# Patient Record
Sex: Female | Born: 1945
Health system: Southern US, Community
[De-identification: ages and names within clinical notes are randomized; demographics above are authoritative.]

## PROBLEM LIST (undated history)

## (undated) DIAGNOSIS — T7840XA Allergy, unspecified, initial encounter: Secondary | ICD-10-CM

## (undated) DIAGNOSIS — M545 Low back pain, unspecified: Secondary | ICD-10-CM

## (undated) DIAGNOSIS — R002 Palpitations: Secondary | ICD-10-CM

## (undated) DIAGNOSIS — U071 COVID-19: Secondary | ICD-10-CM

## (undated) DIAGNOSIS — J449 Chronic obstructive pulmonary disease, unspecified: Secondary | ICD-10-CM

## (undated) DIAGNOSIS — N390 Urinary tract infection, site not specified: Secondary | ICD-10-CM

## (undated) DIAGNOSIS — R55 Syncope and collapse: Secondary | ICD-10-CM

## (undated) DIAGNOSIS — C801 Malignant (primary) neoplasm, unspecified: Secondary | ICD-10-CM

## (undated) DIAGNOSIS — E785 Hyperlipidemia, unspecified: Secondary | ICD-10-CM

## (undated) DIAGNOSIS — I10 Essential (primary) hypertension: Secondary | ICD-10-CM

## (undated) DIAGNOSIS — J069 Acute upper respiratory infection, unspecified: Secondary | ICD-10-CM

## (undated) DIAGNOSIS — K219 Gastro-esophageal reflux disease without esophagitis: Secondary | ICD-10-CM

## (undated) DIAGNOSIS — M1990 Unspecified osteoarthritis, unspecified site: Secondary | ICD-10-CM

## (undated) DIAGNOSIS — F411 Generalized anxiety disorder: Secondary | ICD-10-CM

## (undated) DIAGNOSIS — B029 Zoster without complications: Secondary | ICD-10-CM

## (undated) HISTORY — PX: OOPHORECTOMY: SHX86

## (undated) HISTORY — DX: Essential (primary) hypertension: I10

## (undated) HISTORY — DX: Allergy, unspecified, initial encounter: T78.40XA

## (undated) HISTORY — DX: Chronic obstructive pulmonary disease, unspecified: J44.9

## (undated) HISTORY — DX: COVID-19: U07.1

## (undated) HISTORY — DX: Urinary tract infection, site not specified: N39.0

## (undated) HISTORY — DX: Gastro-esophageal reflux disease without esophagitis: K21.9

## (undated) HISTORY — DX: Palpitations: R00.2

## (undated) HISTORY — DX: Generalized anxiety disorder: F41.1

## (undated) HISTORY — DX: Hyperlipidemia, unspecified: E78.5

## (undated) HISTORY — PX: BREAST SURGERY: SHX581

## (undated) HISTORY — DX: Unspecified osteoarthritis, unspecified site: M19.90

## (undated) HISTORY — DX: Low back pain: M54.5

## (undated) HISTORY — DX: Low back pain, unspecified: M54.50

## (undated) HISTORY — DX: Zoster without complications: B02.9

## (undated) HISTORY — DX: Syncope and collapse: R55

## (undated) HISTORY — DX: Acute upper respiratory infection, unspecified: J06.9

## (undated) HISTORY — DX: Malignant (primary) neoplasm, unspecified: C80.1

---

## 1998-03-09 ENCOUNTER — Other Ambulatory Visit: Admission: RE | Admit: 1998-03-09 | Discharge: 1998-03-09 | Payer: Self-pay | Admitting: Obstetrics and Gynecology

## 1999-05-01 ENCOUNTER — Other Ambulatory Visit: Admission: RE | Admit: 1999-05-01 | Discharge: 1999-05-01 | Payer: Self-pay | Admitting: Obstetrics and Gynecology

## 2000-05-01 ENCOUNTER — Other Ambulatory Visit: Admission: RE | Admit: 2000-05-01 | Discharge: 2000-05-01 | Payer: Self-pay | Admitting: Obstetrics and Gynecology

## 2001-05-11 ENCOUNTER — Other Ambulatory Visit: Admission: RE | Admit: 2001-05-11 | Discharge: 2001-05-11 | Payer: Self-pay | Admitting: Obstetrics and Gynecology

## 2002-05-12 ENCOUNTER — Other Ambulatory Visit: Admission: RE | Admit: 2002-05-12 | Discharge: 2002-05-12 | Payer: Self-pay | Admitting: Obstetrics and Gynecology

## 2003-04-20 HISTORY — PX: COLONOSCOPY: SHX174

## 2003-04-25 ENCOUNTER — Ambulatory Visit (HOSPITAL_COMMUNITY): Admission: RE | Admit: 2003-04-25 | Discharge: 2003-04-25 | Payer: Self-pay | Admitting: Internal Medicine

## 2003-04-25 ENCOUNTER — Encounter: Payer: Self-pay | Admitting: Internal Medicine

## 2003-05-16 ENCOUNTER — Other Ambulatory Visit: Admission: RE | Admit: 2003-05-16 | Discharge: 2003-05-16 | Payer: Self-pay | Admitting: Obstetrics and Gynecology

## 2003-05-22 ENCOUNTER — Encounter (INDEPENDENT_AMBULATORY_CARE_PROVIDER_SITE_OTHER): Payer: Self-pay | Admitting: Surgery

## 2003-05-22 ENCOUNTER — Ambulatory Visit (HOSPITAL_COMMUNITY): Admission: RE | Admit: 2003-05-22 | Discharge: 2003-05-22 | Payer: Self-pay | Admitting: Surgery

## 2003-05-22 ENCOUNTER — Ambulatory Visit (HOSPITAL_BASED_OUTPATIENT_CLINIC_OR_DEPARTMENT_OTHER): Admission: RE | Admit: 2003-05-22 | Discharge: 2003-05-22 | Payer: Self-pay | Admitting: Surgery

## 2003-05-22 ENCOUNTER — Encounter (INDEPENDENT_AMBULATORY_CARE_PROVIDER_SITE_OTHER): Payer: Self-pay | Admitting: Specialist

## 2003-06-12 ENCOUNTER — Encounter (HOSPITAL_COMMUNITY): Admission: RE | Admit: 2003-06-12 | Discharge: 2003-09-10 | Payer: Self-pay | Admitting: Surgery

## 2003-06-13 ENCOUNTER — Ambulatory Visit: Admission: RE | Admit: 2003-06-13 | Discharge: 2003-08-17 | Payer: Self-pay | Admitting: Radiation Oncology

## 2003-09-19 ENCOUNTER — Ambulatory Visit: Admission: RE | Admit: 2003-09-19 | Discharge: 2003-09-19 | Payer: Self-pay | Admitting: Radiation Oncology

## 2004-04-09 ENCOUNTER — Ambulatory Visit: Admission: RE | Admit: 2004-04-09 | Discharge: 2004-04-09 | Payer: Self-pay | Admitting: Radiation Oncology

## 2004-04-16 ENCOUNTER — Ambulatory Visit: Admission: RE | Admit: 2004-04-16 | Discharge: 2004-04-16 | Payer: Self-pay | Admitting: Radiation Oncology

## 2004-05-16 ENCOUNTER — Other Ambulatory Visit: Admission: RE | Admit: 2004-05-16 | Discharge: 2004-05-16 | Payer: Self-pay | Admitting: Obstetrics and Gynecology

## 2004-12-25 ENCOUNTER — Ambulatory Visit: Admission: RE | Admit: 2004-12-25 | Discharge: 2004-12-25 | Payer: Self-pay | Admitting: Radiation Oncology

## 2005-06-17 ENCOUNTER — Other Ambulatory Visit: Admission: RE | Admit: 2005-06-17 | Discharge: 2005-06-17 | Payer: Self-pay | Admitting: Obstetrics and Gynecology

## 2006-09-14 ENCOUNTER — Inpatient Hospital Stay (HOSPITAL_COMMUNITY): Admission: EM | Admit: 2006-09-14 | Discharge: 2006-09-17 | Payer: Self-pay | Admitting: Emergency Medicine

## 2006-09-29 ENCOUNTER — Ambulatory Visit: Payer: Self-pay | Admitting: Internal Medicine

## 2006-09-29 LAB — CONVERTED CEMR LAB: Tissue Transglutaminase Ab, IgA: <3 units (ref <5)

## 2006-09-30 ENCOUNTER — Ambulatory Visit: Payer: Self-pay | Admitting: Internal Medicine

## 2006-09-30 LAB — CONVERTED CEMR LAB
Basophils Absolute: 0 10*3/uL (ref 0.0–0.1)
Basophils Relative: 0.3 % (ref 0.0–1.0)
Eosinophils Absolute: 0.1 10*3/uL (ref 0.0–0.6)
Eosinophils Relative: 2 % (ref 0.0–5.0)
Ferritin: 214.6 ng/mL (ref 10.0–291.0)
Folate: 9.6 ng/mL
HCT: 38.9 % (ref 36.0–46.0)
Hemoglobin: 13.5 g/dL (ref 12.0–15.0)
Iron: 82 ug/dL (ref 42–145)
Lymphocytes Relative: 12.4 % (ref 12.0–46.0)
MCHC: 34.7 g/dL (ref 30.0–36.0)
MCV: 94.8 fL (ref 78.0–100.0)
Monocytes Absolute: 0.2 10*3/uL (ref 0.2–0.7)
Monocytes Relative: 3.3 % (ref 3.0–11.0)
Neutro Abs: 4.9 10*3/uL (ref 1.4–7.7)
Neutrophils Relative %: 82 % — ABNORMAL HIGH (ref 43.0–77.0)
Platelets: 426 10*3/uL — ABNORMAL HIGH (ref 150–400)
RBC: 4.1 M/uL (ref 3.87–5.11)
RDW: 13.5 % (ref 11.5–14.6)
Saturation Ratios: 27.7 % (ref 20.0–50.0)
Transferrin: 211.7 mg/dL — ABNORMAL LOW (ref >=212.0)
Vitamin B-12: 433 pg/mL (ref 211–911)
WBC: 5.9 10*3/uL (ref 4.5–10.5)

## 2006-10-01 ENCOUNTER — Encounter: Payer: Self-pay | Admitting: Internal Medicine

## 2006-10-01 LAB — CONVERTED CEMR LAB
Anti Nuclear Antibody(ANA): NEGATIVE
Cyclic Citrullin Peptide Ab: 3 units (ref <20)
Neutrophil cytoplasmic antibodies,IgG,serum: <1:20 {titer}
Rhuematoid fact SerPl-aCnc: <20 intl units/mL — ABNORMAL LOW (ref 0.0–20.0)

## 2006-10-02 ENCOUNTER — Encounter: Admission: RE | Admit: 2006-10-02 | Discharge: 2006-10-02 | Payer: Self-pay | Admitting: Internal Medicine

## 2006-10-26 ENCOUNTER — Ambulatory Visit: Payer: Self-pay | Admitting: Internal Medicine

## 2007-05-24 ENCOUNTER — Ambulatory Visit: Payer: Self-pay | Admitting: Internal Medicine

## 2007-05-24 DIAGNOSIS — J069 Acute upper respiratory infection, unspecified: Secondary | ICD-10-CM | POA: Insufficient documentation

## 2008-10-12 ENCOUNTER — Ambulatory Visit: Payer: Self-pay | Admitting: Internal Medicine

## 2008-10-12 DIAGNOSIS — F411 Generalized anxiety disorder: Secondary | ICD-10-CM

## 2008-10-12 DIAGNOSIS — B029 Zoster without complications: Secondary | ICD-10-CM

## 2008-10-12 HISTORY — DX: Zoster without complications: B02.9

## 2008-10-12 HISTORY — DX: Generalized anxiety disorder: F41.1

## 2009-02-18 DIAGNOSIS — R55 Syncope and collapse: Secondary | ICD-10-CM

## 2009-02-18 HISTORY — DX: Syncope and collapse: R55

## 2009-03-04 ENCOUNTER — Ambulatory Visit: Payer: Self-pay | Admitting: Cardiology

## 2009-03-04 ENCOUNTER — Observation Stay (HOSPITAL_COMMUNITY): Admission: EM | Admit: 2009-03-04 | Discharge: 2009-03-05 | Payer: Self-pay | Admitting: Emergency Medicine

## 2009-03-07 ENCOUNTER — Ambulatory Visit: Payer: Self-pay

## 2009-03-07 ENCOUNTER — Encounter: Payer: Self-pay | Admitting: Cardiology

## 2009-03-26 DIAGNOSIS — J4489 Other specified chronic obstructive pulmonary disease: Secondary | ICD-10-CM

## 2009-03-26 DIAGNOSIS — J449 Chronic obstructive pulmonary disease, unspecified: Secondary | ICD-10-CM

## 2009-03-26 HISTORY — DX: Other specified chronic obstructive pulmonary disease: J44.89

## 2009-03-26 HISTORY — DX: Chronic obstructive pulmonary disease, unspecified: J44.9

## 2009-03-29 ENCOUNTER — Ambulatory Visit: Payer: Self-pay | Admitting: Cardiology

## 2009-07-11 ENCOUNTER — Ambulatory Visit: Payer: Self-pay | Admitting: Internal Medicine

## 2010-03-22 ENCOUNTER — Telehealth: Payer: Self-pay | Admitting: Internal Medicine

## 2010-05-27 ENCOUNTER — Ambulatory Visit: Payer: Self-pay | Admitting: Internal Medicine

## 2010-05-27 DIAGNOSIS — N390 Urinary tract infection, site not specified: Secondary | ICD-10-CM | POA: Insufficient documentation

## 2010-05-27 DIAGNOSIS — M545 Low back pain, unspecified: Secondary | ICD-10-CM | POA: Insufficient documentation

## 2010-05-27 LAB — CONVERTED CEMR LAB
Bilirubin Urine: NEGATIVE
Glucose, Urine, Semiquant: NEGATIVE
Ketones, urine, test strip: NEGATIVE
Specific Gravity, Urine: 1.01
pH: 5

## 2010-06-24 ENCOUNTER — Telehealth: Payer: Self-pay | Admitting: Internal Medicine

## 2010-06-25 ENCOUNTER — Telehealth: Payer: Self-pay | Admitting: Internal Medicine

## 2010-08-20 NOTE — Progress Notes (Signed)
Summary: refill metoprolol  Phone Note Refill Request Message from:  Fax from Pharmacy on March 22, 2010 10:02 AM  Refills Requested: Medication #1:  METOPROLOL TARTRATE 25 MG TABS Take one-half tablet by mouth twice a day   Last Refilled: 09/09/2009 [facility]    Method Requested: Fax to Local Pharmacy Initial call taken by: Duard Brady LPN,  March 22, 2010 10:03 AM    Prescriptions: METOPROLOL TARTRATE 25 MG TABS (METOPROLOL TARTRATE) Take one-half tablet by mouth twice a day  #90 x 0   Entered by:   Duard Brady LPN   Authorized by:   Gordy Savers  MD   Signed by:   Duard Brady LPN on 33/29/5188   Method used:   Faxed to ...       [facility] [address] [phone]* (retail)       [address]       [address]       [place], [place]  [address]       Ph: [phone]       Fax: [phone]   RxID:   [id number]

## 2010-08-20 NOTE — Progress Notes (Signed)
Summary: refill alprazolam  Phone Note Refill Request Message from:  Fax from Pharmacy on March 22, 2010 10:05 AM  Refills Requested: Medication #1:  ALPRAZOLAM 0.5 MG TABS one twice daily as needed--NEEDS OFFICE VISIT FOR ADDITIONAL REFILLS   Last Refilled: 07/09/2009 [facility]   [phone]   Method Requested: Fax to Local Pharmacy Initial call taken by: Duard Brady LPN,  March 22, 2010 10:05 AM    Prescriptions: ALPRAZOLAM 0.5 MG TABS (ALPRAZOLAM) one twice daily as needed--NEEDS OFFICE VISIT FOR ADDITIONAL REFILLS  #50 x 2   Entered by:   Duard Brady LPN   Authorized by:   Gordy Savers  MD   Signed by:   Duard Brady LPN on 81/19/1478   Method used:   Historical   RxID:   [id number]

## 2010-08-20 NOTE — Progress Notes (Signed)
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    Initial call taken by: Debbra Riding,  June 25, 2010 1:56 PM    Prescriptions: METOPROLOL TARTRATE 25 MG TABS (METOPROLOL TARTRATE) Take one-half tablet by mouth twice a day  #90 x 0   Entered by:   Duard Brady LPN   Authorized by:   Gordy Savers  MD   Signed by:   Duard Brady LPN on 98/05/9146   Method used:   Electronically to        [facility] [phone]* (retail)       [address]       [address]       [place], [place]  [address]       Ph: [phone]       Fax: [phone]   RxID:   [id number]

## 2010-08-20 NOTE — Assessment & Plan Note (Signed)
Summary: uti/freq urination/cjr   Vital Signs:  Patient profile:   65 year old female Weight:      112 pounds Temp:     98.2 degrees F oral BP sitting:   102 / 64  (left arm) Cuff size:   regular  Vitals Entered By: Duard Brady LPN (May 27, 2010 2:23 PM) CC: c/o low back pain    ?UTI Is Patient Diabetic? No   Primary Care Provider:  Dr. Amador Cunas  CC:  c/o low back pain    ?UTI.  History of Present Illness: 65 year old patient, who presents with a 4-day history of low back pain.  She describes a constant, dull, aching sensation.  Pain is not aggravated by movement.  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  For the past couple days.  She has also noticed some fullness in the lower abdominal area, but no real dysuria.  A UA was reviewed and did reveal small amount of leukocytes  Allergies: 1)  ! Codeine Phosphate (Codeine Phosphate) 2)  ! Ephedrine Hcl (Ephedrine Hcl) 3)  * Dextran 70-Hypromellose 4)  Penicillin G Potassium (Penicillin G Potassium)  Past History:  Past Medical History: Reviewed history from 03/29/2009 and no changes required. 1. COPD (ICD-496): Prior smoker.  2. HERPES ZOSTER (ICD-053.9) 3. ANXIETY (ICD-300.00) 4. Presyncopal episode 8/10: probably vasovagal.  Admitted, MI ruled out, ECG normal, telemetry with PVCs/PACs.  - Stress echo: no evidence for ischemia.  EF 65-70%, no valvular abnormalities, no pulmonary HTN.  5. Palpitations: Likely secondary to PACs/PVCs.  Suppressed with metoprolol.  6. Breast cancer: radiation. 7. S/p ovariectomy.   Review of Systems       The patient complains of anorexia.  The patient denies fever, weight loss, weight gain, vision loss, decreased hearing, hoarseness, chest pain, syncope, dyspnea on exertion, peripheral edema, prolonged cough, headaches, hemoptysis, abdominal pain, melena, hematochezia, severe indigestion/heartburn, hematuria, incontinence, genital sores, muscle weakness,  suspicious skin lesions, transient blindness, difficulty walking, depression, unusual weight change, abnormal bleeding, enlarged lymph nodes, angioedema, and breast masses.    Physical Exam  General:  underweight appearing.  normal blood pressureunderweight appearing.   Abdomen:  very mild lower midabdominal tenderness Msk:  examination of the  low back revealed some tight tense musculature in the left lumbar area   Impression & Recommendations:  Problem # 1:  LOW BACK PAIN, ACUTE (ICD-724.2)  Her updated medication list for this problem includes:    Ibuprofen 200 Mg Tabs (Ibuprofen) .Marland Kitchen... As needed    Aspirin 81 Mg Tbec (Aspirin) .Marland Kitchen... Take one tablet by mouth daily  Orders: UA Dipstick W/ Micro (manual) (45409)  Her updated medication list for this problem includes:    Ibuprofen 200 Mg Tabs (Ibuprofen) .Marland Kitchen... As needed    Aspirin 81 Mg Tbec (Aspirin) .Marland Kitchen... Take one tablet by mouth daily  Problem # 2:  UTI (ICD-599.0)  Her updated medication list for this problem includes:    Ciprofloxacin Hcl 500 Mg Tabs (Ciprofloxacin hcl) ..... One twice daily for 5 days  Her updated medication list for this problem includes:    Ciprofloxacin Hcl 500 Mg Tabs (Ciprofloxacin hcl) ..... One twice daily for 5 days  Complete Medication List: 1)  Alprazolam 0.5 Mg Tabs (Alprazolam) .... One twice daily as needed 2)  Sertraline Hcl 50 Mg Tabs (Sertraline hcl) .... One every morning 3)  Ibuprofen 200 Mg Tabs (Ibuprofen) .... As needed 4)  Aspirin 81 Mg Tbec (Aspirin) .... Take one tablet by  mouth daily 5)  Metoprolol Tartrate 25 Mg Tabs (Metoprolol tartrate) .... Take one-half tablet by mouth twice a day 6)  Benzonatate 200 Mg Caps (Benzonatate) .... One capsule 3 times daily as needed for cough 7)  Ciprofloxacin Hcl 500 Mg Tabs (Ciprofloxacin hcl) .... One twice daily for 5 days  Patient Instructions: 1)  Most patients (90%) with low back pain will improve with time. Keep active but avoid  activities that are painful. Apply moist heat  to lower back several times a day. Prescriptions: CIPROFLOXACIN HCL 500 MG TABS (CIPROFLOXACIN HCL) one twice daily for 5 days  #10 x 0   Entered and Authorized by:   Gordy Savers  MD   Signed by:   Gordy Savers  MD on 05/27/2010   Method used:   Electronically to        [facility] [address] [phone]* (retail)       [address]       [address]       [place], [place]  [address]       Ph: [phone]       Fax: [phone]   RxID:   [id number]    Orders Added: 1)  UA Dipstick W/ Micro (manual) [81000] 2)  Est. Patient Level III [87564]    Laboratory Results   Urine Tests  Date/Time Received: May 27, 2010 2:36 PM  Date/Time Reported: May 27, 2010 2:36 PM   Routine Urinalysis   Color: lt. yellow Appearance: Clear Glucose: negative   (Normal Range: Negative) Bilirubin: negative   (Normal Range: Negative) Ketone: negative   (Normal Range: Negative) Spec. Gravity: 1.010   (Normal Range: 1.003-1.035) Blood: trace-intact   (Normal Range: Negative) pH: 5.0   (Normal Range: 5.0-8.0) Protein: negative   (Normal Range: Negative) Urobilinogen: 0.2   (Normal Range: 0-1) Nitrite: negative   (Normal Range: Negative) Leukocyte Esterace: small   (Normal Range: Negative)

## 2010-08-20 NOTE — Progress Notes (Signed)
Summary: REQUEST FOR SWITCH  Phone Note Call from Patient   Caller: Patient   [phone] Summary of Call: Pt would like to switch from the care of Dr Cato Mulligan to that of Dr Amador Cunas.... Pt adv that she has been seeing Dr Kirtland Bouchard over the past year because of Dr Cato Mulligan not being available in the afternoons...Marland KitchenMarland KitchenMarland Kitchen Pt adv that request would be sent to both physicians ref switch so that they can mutually agree to the switch.    Initial call taken by: Debbra Riding,  June 24, 2010 4:57 PM  Follow-up for Phone Call        ok Follow-up by: Gordy Savers  MD,  June 24, 2010 5:41 PM  Additional Follow-up for Phone Call Additional follow up Details #1::        ok Additional Follow-up by: Birdie Sons MD,  June 25, 2010 8:21 AM    Additional Follow-up for Phone Call Additional follow up Details #2::    LMTCB so pt can make an official appt with Dr Kirtland Bouchard to est with him.  Follow-up by: Debbra Riding,  June 25, 2010 9:37 AM

## 2010-08-20 NOTE — Progress Notes (Signed)
Summary: refill metoprolol  Phone Note Refill Request Message from:  Fax from Pharmacy on June 24, 2010 12:48 PM  Refills Requested: Medication #1:  METOPROLOL TARTRATE 25 MG TABS Take one-half tablet by mouth twice a day   [facility]: [facility]/2010 [facility]   Method Requested: Fax to Local Pharmacy Initial call taken by: Duard Brady LPN,  June 24, 2010 12:48 PM  Follow-up for Phone Call        pt needs ov Follow-up by: Alfred Levins, CMA,  June 24, 2010 2:23 PM  Additional Follow-up for Phone Call Additional follow up Details #1::        Called pt to adv her that she will need to schedule an OV with Dr Cato Mulligan.... Pt was under the assumption that she had been switched to Dr Charm Rings care... Official request has not been submitted.  Pt is requesting to change physicians (from Dr Cato Mulligan to Dr Kirtland Bouchard).... Notation sent to Dr Cato Mulligan / Dr Amador Cunas  requesting change.   Additional Follow-up by: Debbra Riding,  June 24, 2010 4:53 PM    Additional Follow-up for Phone Call Additional follow up Details #2::    Pt has officially been switched from Dr Cato Mulligan to Dr Amador Cunas - both physicians mutually agree to switch (See previous EMR notation).Marland KitchenMarland KitchenMarland KitchenLeft message to call back so she can be advised of same.  Follow-up by: Debbra Riding,  June 25, 2010 9:46 AM  Additional Follow-up for Phone Call Additional follow up Details #3:: Details for Additional Follow-up Action Taken: both Dr's approved Additional Follow-up by: Alfred Levins, CMA,  June 25, 2010 1:46 PM

## 2010-08-22 ENCOUNTER — Telehealth: Payer: Self-pay | Admitting: Internal Medicine

## 2010-08-22 ENCOUNTER — Encounter: Payer: Self-pay | Admitting: Internal Medicine

## 2010-08-22 ENCOUNTER — Ambulatory Visit (INDEPENDENT_AMBULATORY_CARE_PROVIDER_SITE_OTHER): Payer: Medicare Other | Admitting: Internal Medicine

## 2010-08-22 VITALS — BP 110/70 | Temp 98.2°F | Ht 66.0 in | Wt 112.0 lb

## 2010-08-22 DIAGNOSIS — J069 Acute upper respiratory infection, unspecified: Secondary | ICD-10-CM

## 2010-08-22 DIAGNOSIS — F411 Generalized anxiety disorder: Secondary | ICD-10-CM

## 2010-08-22 DIAGNOSIS — J449 Chronic obstructive pulmonary disease, unspecified: Secondary | ICD-10-CM

## 2010-08-22 MED ORDER — HYDROCODONE-HOMATROPINE 5-1.5 MG/5ML PO SYRP: 2.5000 mL | ORAL_SOLUTION | Freq: Four times a day (QID) | ORAL | Status: AC | PRN

## 2010-08-22 MED ORDER — ALPRAZOLAM 0.5 MG PO TABS: 0.5000 mg | ORAL_TABLET | Freq: Every evening | ORAL | Status: AC | PRN

## 2010-08-22 NOTE — Telephone Encounter (Signed)
ok 

## 2010-08-22 NOTE — Telephone Encounter (Signed)
Pt would like to be seen today with Dr Kirtland Bouchard this afternoon. Has classic sinus infection with head and chest congestion. Please advise.

## 2010-08-22 NOTE — Patient Instructions (Signed)
Get plenty of rest, Drink lots of  clear liquids, and use Tylenol or ibuprofen for fever and discomfort.    

## 2010-08-22 NOTE — Progress Notes (Signed)
  Subjective:    Patient ID: [name], female    DOB: 06/06/1946, 65 y.o.   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  She has a significant cough, which is interfering with sleep.  It is minimally productive.  There is been no fever.  Several students and coworkers have had similar illness.  Associated symptoms include rhinorrhea.  She has had mild nausea with codeine, but not a true allergy.  She has a history of COPD, which has been stable.  Denies any shortness or breath or wheezing    Review of Systems  Constitutional: Positive for chills and fatigue.  HENT: Positive for congestion and rhinorrhea. Negative for hearing loss, sore throat, dental problem, sinus pressure and tinnitus.   Eyes: Negative for pain, discharge and visual disturbance.  Respiratory: Positive for cough. Negative for shortness of breath.   Cardiovascular: Negative for chest pain, palpitations and leg swelling.  Gastrointestinal: Negative for nausea, vomiting, abdominal pain, diarrhea, constipation, blood in stool and abdominal distention.  Genitourinary: Negative for dysuria, urgency, frequency, hematuria, flank pain, vaginal bleeding, vaginal discharge, difficulty urinating, vaginal pain and pelvic pain.  Musculoskeletal: Negative for joint swelling, arthralgias and gait problem.  Skin: Negative for rash.  Neurological: Negative for dizziness, syncope, speech difficulty, weakness, numbness and headaches.  Hematological: Negative for adenopathy. Does not bruise/bleed easily.  Psychiatric/Behavioral: Negative for behavioral problems, dysphoric mood and agitation. The patient is not nervous/anxious.        Objective:   Physical Exam  Constitutional: She is oriented to person, place, and time. She appears well-developed and well-nourished.  HENT:  Head: Normocephalic.  Right Ear: External ear normal.  Left Ear: External ear normal.    Mouth/Throat: Oropharynx is clear and moist.  Eyes: Conjunctivae and EOM are normal. Pupils are equal, round, and reactive to light.  Neck: Normal range of motion. Neck supple. No thyromegaly present.  Cardiovascular: Normal rate, regular rhythm, normal heart sounds and intact distal pulses.   Pulmonary/Chest: Effort normal and breath sounds normal.  Abdominal: Soft. Bowel sounds are normal. She exhibits no mass. There is no tenderness.  Musculoskeletal: Normal range of motion.  Lymphadenopathy:    She has no cervical adenopathy.  Neurological: She is alert and oriented to person, place, and time.  Skin: Skin is warm and dry. No rash noted.  Psychiatric: She has a normal mood and affect. Her behavior is normal.          Assessment & Plan:    1. URI-Will treat symptomatically 2.  COPD stable

## 2010-08-26 ENCOUNTER — Emergency Department (HOSPITAL_COMMUNITY)
Admission: EM | Admit: 2010-08-26 | Discharge: 2010-08-26 | Disposition: A | Discharge status: Home / Self Care | Payer: Medicare Other | Attending: Emergency Medicine | Admitting: Emergency Medicine

## 2010-08-26 ENCOUNTER — Ambulatory Visit: Payer: Self-pay | Admitting: Internal Medicine

## 2010-08-26 ENCOUNTER — Emergency Department (HOSPITAL_COMMUNITY): Payer: Medicare Other

## 2010-08-26 DIAGNOSIS — J189 Pneumonia, unspecified organism: Secondary | ICD-10-CM | POA: Insufficient documentation

## 2010-08-26 DIAGNOSIS — R509 Fever, unspecified: Secondary | ICD-10-CM | POA: Insufficient documentation

## 2010-08-26 DIAGNOSIS — F341 Dysthymic disorder: Secondary | ICD-10-CM | POA: Insufficient documentation

## 2010-08-26 DIAGNOSIS — Z79899 Other long term (current) drug therapy: Secondary | ICD-10-CM | POA: Insufficient documentation

## 2010-09-02 ENCOUNTER — Ambulatory Visit (INDEPENDENT_AMBULATORY_CARE_PROVIDER_SITE_OTHER): Payer: Medicare Other | Admitting: Internal Medicine

## 2010-09-02 ENCOUNTER — Encounter: Payer: Self-pay | Admitting: Internal Medicine

## 2010-09-02 VITALS — BP 112/80 | Temp 98.1°F | Wt 108.0 lb

## 2010-09-02 DIAGNOSIS — B029 Zoster without complications: Secondary | ICD-10-CM

## 2010-09-02 DIAGNOSIS — J189 Pneumonia, unspecified organism: Secondary | ICD-10-CM

## 2010-09-02 DIAGNOSIS — J449 Chronic obstructive pulmonary disease, unspecified: Secondary | ICD-10-CM

## 2010-09-02 NOTE — Patient Instructions (Signed)
Drink as much fluid as you  can tolerate over the next few days   It is important that you exercise regularly, at least 20 minutes 3 to 4 times per week.  If you develop chest pain or shortness of breath seek  medical attention.  Return in one month for follow-up

## 2010-09-02 NOTE — Progress Notes (Signed)
  Subjective:    Patient ID: [name], female    DOB: [birth year], 65 y.o.   MRN: [id number]  HPI  65 year old patient who was seen here earlier and she is symptomatically for a URI.  Four days later, she was seen at the emergency room and treated for suspected right middle lobe pneumonia.  Chest x-ray suggested a patchy infiltrate in the right middle lung.  She has completed 8 days of Avelox.  She continues to have a considerable nausea and remains weak.  Her oral intake has been marginal.  She is on a antiasthmatic which has been marginally helpful.  She complains of fullness in the ears.  Her cough has largely  resolved.  She complains of weakness, nausea, and sinus congestion.    Review of Systems  Constitutional: Positive for activity change, appetite change, fatigue and unexpected weight change.  HENT: Positive for congestion. Negative for hearing loss, sore throat, rhinorrhea, dental problem, sinus pressure and tinnitus.   Eyes: Negative for pain, discharge and visual disturbance.  Respiratory: Positive for cough. Negative for shortness of breath.   Cardiovascular: Negative for chest pain, palpitations and leg swelling.  Gastrointestinal: Negative for nausea, vomiting, abdominal pain, diarrhea, constipation, blood in stool and abdominal distention.  Genitourinary: Negative for dysuria, urgency, frequency, hematuria, flank pain, vaginal bleeding, vaginal discharge, difficulty urinating, vaginal pain and pelvic pain.  Musculoskeletal: Negative for joint swelling, arthralgias and gait problem.  Skin: Positive for rash.  Neurological: Negative for dizziness, syncope, speech difficulty, weakness, numbness and headaches.  Hematological: Negative for adenopathy.  Psychiatric/Behavioral: Negative for behavioral problems, dysphoric mood and agitation. The patient is not nervous/anxious.        Objective:   Physical Exam  Constitutional: She is oriented to person, place, and time. She  appears well-developed and well-nourished.       Appears slightly weak, but alert and in no distress  HENT:  Head: Normocephalic and atraumatic.  Right Ear: External ear normal.  Left Ear: External ear normal.  Mouth/Throat: Oropharynx is clear and moist.  Eyes: Conjunctivae and EOM are normal. Pupils are equal, round, and reactive to light.  Neck: Normal range of motion. Neck supple. No thyromegaly present.  Cardiovascular: Normal rate, regular rhythm, normal heart sounds and intact distal pulses.   Pulmonary/Chest: Effort normal and breath sounds normal.       Chest the chest is clear O2 saturation 97% Pulse rate 90  Abdominal: Soft. Bowel sounds are normal. She exhibits no mass. There is no tenderness.  Musculoskeletal: Normal range of motion.  Lymphadenopathy:    She has no cervical adenopathy.  Neurological: She is alert and oriented to person, place, and time.  Skin: Skin is warm and dry. Rash noted.       Patient had a healing vesicular rash involving her right buttock region, consistent with shingles  Psychiatric: She has a normal mood and affect. Her behavior is normal.          Assessment & Plan:  Probable resolving right middle lobe pneumonia  Weakness Nausea Shingles COPD

## 2010-09-04 ENCOUNTER — Encounter: Payer: Self-pay | Admitting: Family Medicine

## 2010-09-04 ENCOUNTER — Ambulatory Visit (INDEPENDENT_AMBULATORY_CARE_PROVIDER_SITE_OTHER): Payer: Medicare Other | Admitting: Family Medicine

## 2010-09-04 VITALS — BP 140/78 | Temp 98.3°F | Wt 107.0 lb

## 2010-09-04 DIAGNOSIS — R05 Cough: Secondary | ICD-10-CM

## 2010-09-04 DIAGNOSIS — H6500 Acute serous otitis media, unspecified ear: Secondary | ICD-10-CM

## 2010-09-04 DIAGNOSIS — J189 Pneumonia, unspecified organism: Secondary | ICD-10-CM

## 2010-09-04 DIAGNOSIS — R110 Nausea: Secondary | ICD-10-CM

## 2010-09-04 DIAGNOSIS — R059 Cough, unspecified: Secondary | ICD-10-CM

## 2010-09-04 DIAGNOSIS — R5381 Other malaise: Secondary | ICD-10-CM

## 2010-09-04 DIAGNOSIS — R5383 Other fatigue: Secondary | ICD-10-CM

## 2010-09-04 LAB — HEPATIC FUNCTION PANEL
AST: 22 U/L (ref 0–37)
Albumin: 3.1 g/dL — ABNORMAL LOW (ref 3.5–5.2)
Alkaline Phosphatase: 78 U/L (ref 39–117)

## 2010-09-04 LAB — BASIC METABOLIC PANEL
CO2: 29 mEq/L (ref 19–32)
Calcium: 8.6 mg/dL (ref 8.4–10.5)
Creatinine, Ser: 0.8 mg/dL (ref 0.4–1.2)
GFR: 73.31 mL/min (ref >60.00)

## 2010-09-04 NOTE — Patient Instructions (Signed)
Drink plenty of fluids Follow up promptly for any fever or shortness of breath

## 2010-09-04 NOTE — Progress Notes (Signed)
  Subjective:    Patient ID: [name], female    DOB: 11-17-1945, 65 y.o.   MRN: [id number]  HPI  Patient seen for followup. Refer to previous notes. Probable viral upper respiratory infection several days ago. Symptoms worsened and patient went to emergency room diagnosed with possible right middle lobe pneumonia. Treated with  8 day course of Avelox. She developed some nausea and stopped after 8 days. Patient has a persistent weakness at this point and bilateral ear pressure right greater than left. Some persistent nausea but no vomiting. Denies any abdominal pain or diarrhea. Cough overall improved. Continued night sweats. Her major concern is that she has some continued weakness and nausea. She's been out of work for approximately 2 weeks.   Recent shingles right buttock with minimal pain and overall drying up. Patient is nonsmoker   Review of Systems  Constitutional: Positive for diaphoresis, activity change, appetite change and fatigue. Negative for fever.  HENT: Negative for hearing loss, congestion, sore throat, neck stiffness and sinus pressure.   Respiratory: Positive for cough. Negative for shortness of breath and wheezing.   Cardiovascular: Negative for chest pain and palpitations.  Gastrointestinal: Positive for nausea. Negative for vomiting, abdominal pain, diarrhea and blood in stool.  Genitourinary: Negative for dysuria and flank pain.  Musculoskeletal: Negative for arthralgias.  Skin: Negative for rash.  Neurological: Negative for syncope.  Hematological: Negative for adenopathy.       Objective:   Physical Exam  alert , thin female in no distress Oropharynx is moist and clear Eardrums right serous effusion. Left eardrum normal Neck is supple no adenopathy Chest slightly diminished breath sounds right base compared to left but no rales no wheezes  Heart regular rhythm and rate Extremity no edema        Assessment & Plan:   #1 right middle lobe pneumonia  treated with full course of Avelox  #2 fatigue and weakness of uncertain etiology  #3 nausea without vomiting    we'll obtain some screening labs including CBC, basic metabolic panel, hepatic panel, and TSH. Consider repeat chest x-ray

## 2010-09-05 LAB — CBC WITH DIFFERENTIAL/PLATELET
Basophils Relative: 0.3 % (ref 0.0–3.0)
Eosinophils Absolute: 0.1 10*3/uL (ref 0.0–0.7)
Eosinophils Relative: 1.3 % (ref 0.0–5.0)
HCT: 33.3 % — ABNORMAL LOW (ref 36.0–46.0)
Hemoglobin: 11.5 g/dL — ABNORMAL LOW (ref 12.0–15.0)
Lymphs Abs: 0.7 10*3/uL (ref 0.7–4.0)
Monocytes Absolute: 0.2 10*3/uL (ref 0.1–1.0)
Monocytes Relative: 3.5 % (ref 3.0–12.0)
RDW: 12.7 % (ref 11.5–14.6)

## 2010-09-05 NOTE — Progress Notes (Signed)
Quick Note:  Pt informed and her son will pick up hemo-cult card today, pt has OV scheduled with Dr Kirtland Bouchard on March 12. ______

## 2010-09-06 ENCOUNTER — Ambulatory Visit: Payer: Medicare Other | Admitting: Internal Medicine

## 2010-09-09 ENCOUNTER — Other Ambulatory Visit (INDEPENDENT_AMBULATORY_CARE_PROVIDER_SITE_OTHER): Payer: Medicare Other | Admitting: Internal Medicine

## 2010-09-09 DIAGNOSIS — D649 Anemia, unspecified: Secondary | ICD-10-CM

## 2010-09-09 LAB — HEMOCCULT GUIAC POC 1CARD (OFFICE): Card #2 Fecal Occult Blod, POC: NEGATIVE

## 2010-09-10 ENCOUNTER — Ambulatory Visit (INDEPENDENT_AMBULATORY_CARE_PROVIDER_SITE_OTHER): Payer: Medicare Other | Admitting: Internal Medicine

## 2010-09-10 ENCOUNTER — Encounter: Payer: Self-pay | Admitting: Internal Medicine

## 2010-09-10 DIAGNOSIS — J449 Chronic obstructive pulmonary disease, unspecified: Secondary | ICD-10-CM

## 2010-09-10 DIAGNOSIS — H73019 Bullous myringitis, unspecified ear: Secondary | ICD-10-CM

## 2010-09-11 ENCOUNTER — Encounter: Payer: Self-pay | Admitting: Internal Medicine

## 2010-09-11 NOTE — Progress Notes (Signed)
  Subjective:    Patient ID: [name], female    DOB: 1946/04/24, 65 y.o.   MRN: [id number]  HPI   65 year old patient who is seen today for followup. She has been treated recently for a right middle lobe pneumonia which was confirmed radiographically. She has completed 10 days of Avelox and has improved. She remains weak but very little in the the way of cough or any other pulmonary complaints no shortness of breath. She does complain of some fullness involving the right ear she feels there is some tenderness in the anterior neck region. Laboratory studies were done during her acute illness and revealed very mild anemia. Subsequent stool examination was negative for occult blood. She is up-to-date on her colonoscopies.   Review of Systems  Constitutional: Positive for fatigue. Negative for fever.  HENT: Positive for ear pain. Negative for hearing loss, congestion, sore throat, rhinorrhea, dental problem, sinus pressure and tinnitus.   Eyes: Negative for pain, discharge and visual disturbance.  Respiratory: Negative for cough and shortness of breath.   Cardiovascular: Negative for chest pain, palpitations and leg swelling.  Gastrointestinal: Negative for nausea, vomiting, abdominal pain, diarrhea, constipation, blood in stool and abdominal distention.  Genitourinary: Negative for dysuria, urgency, frequency, hematuria, flank pain, vaginal bleeding, vaginal discharge, difficulty urinating, vaginal pain and pelvic pain.  Musculoskeletal: Negative for joint swelling, arthralgias and gait problem.  Skin: Negative for rash.  Neurological: Negative for dizziness, syncope, speech difficulty, weakness, numbness and headaches.  Hematological: Negative for adenopathy.  Psychiatric/Behavioral: Negative for behavioral problems, dysphoric mood and agitation. The patient is not nervous/anxious.        Objective:   Physical Exam  Constitutional: She is oriented to person, place, and time. She appears  well-developed and well-nourished.  HENT:  Head: Normocephalic.  Left Ear: External ear normal.  Mouth/Throat: Oropharynx is clear and moist.        Right ear with bulla formation of the tympanic membrane  Eyes: Conjunctivae and EOM are normal. Pupils are equal, round, and reactive to light.  Neck: Normal range of motion. Neck supple. No thyromegaly present.  Cardiovascular: Normal rate, regular rhythm, normal heart sounds and intact distal pulses.   Pulmonary/Chest: Effort normal and breath sounds normal.  Abdominal: Soft. Bowel sounds are normal. She exhibits no mass. There is no tenderness.  Musculoskeletal: Normal range of motion.  Lymphadenopathy:    She has no cervical adenopathy.  Neurological: She is alert and oriented to person, place, and time.  Skin: Skin is warm and dry. No rash noted.  Psychiatric: She has a normal mood and affect. Her behavior is normal.          Assessment & Plan:   recent right middle lobe pneumonia in the setting of bullous myringitis-  Probably has had a mycoplasma pneumonia which should respond very nicely to the Avelox therapy she is clinically much improved  Mild anemia. The patient is somewhat concerned about this but it was explained that this most likely is related to her acute illness. Do not feel that she needs a GI evaluation at this time. Will followup a CBC at the next visit and suspect this will be normal

## 2010-09-11 NOTE — Patient Instructions (Signed)
Call or return to clinic prn if these symptoms worsen or fail to improve as anticipated.    It is important that you exercise regularly, at least 20 minutes 3 to 4 times per week.  If you develop chest pain or shortness of breath seek  medical attention.  Take a calcium supplement, plus [phone] units of vitamin D

## 2010-09-12 ENCOUNTER — Encounter (INDEPENDENT_AMBULATORY_CARE_PROVIDER_SITE_OTHER): Payer: Self-pay | Admitting: *Deleted

## 2010-09-12 ENCOUNTER — Telehealth: Payer: Self-pay | Admitting: Internal Medicine

## 2010-09-12 ENCOUNTER — Other Ambulatory Visit: Payer: Self-pay | Admitting: Internal Medicine

## 2010-09-12 ENCOUNTER — Other Ambulatory Visit: Payer: Medicare Other

## 2010-09-12 DIAGNOSIS — D539 Nutritional anemia, unspecified: Secondary | ICD-10-CM

## 2010-09-12 LAB — FERRITIN: Ferritin: 135.5 ng/mL (ref 10.0–291.0)

## 2010-09-12 LAB — IBC PANEL: Saturation Ratios: 29.4 % (ref 20.0–50.0)

## 2010-09-12 LAB — B12 AND FOLATE PANEL: Folate: 10 ng/mL (ref >5.9)

## 2010-09-17 NOTE — Progress Notes (Signed)
Summary: Labs request  Phone Note Call from Patient Call back at Home Phone [phone]   Caller: Patient Call For: Dr.Niah Heinle Reason for Call: Talk to Nurse Summary of Call: Pt is coming around 2 for labs requested by Dr. Marina Goodell Initial call taken by: [place] Johnson,  September 12, 2010 9:00 AM  Follow-up for Phone Call        Patient states that per Dr. Marina Goodell she is supposed to come in today for some lab work. Dr. Marina Goodell please advise. Follow-up by: Selinda Michaels RN,  September 12, 2010 9:15 AM  Additional Follow-up for Phone Call Additional follow up Details #1::        anemia panel per my flag sent earlier today. thanks Additional Follow-up by: Hilarie Fredrickson MD,  September 12, 2010 9:26 AM    Additional Follow-up for Phone Call Additional follow up Details #2::    Patient aware of appointment date and time for labs and orders entered into the computer. Follow-up by: Selinda Michaels RN,  September 12, 2010 9:42 AM

## 2010-09-30 ENCOUNTER — Ambulatory Visit: Payer: Medicare Other | Admitting: Internal Medicine

## 2010-10-10 ENCOUNTER — Encounter: Payer: Self-pay | Admitting: Internal Medicine

## 2010-10-10 ENCOUNTER — Ambulatory Visit (INDEPENDENT_AMBULATORY_CARE_PROVIDER_SITE_OTHER): Payer: Medicare Other | Admitting: Internal Medicine

## 2010-10-10 DIAGNOSIS — J449 Chronic obstructive pulmonary disease, unspecified: Secondary | ICD-10-CM

## 2010-10-10 NOTE — Patient Instructions (Signed)
Call or return to clinic prn if these symptoms worsen or fail to improve as anticipated.

## 2010-10-10 NOTE — Progress Notes (Signed)
  Subjective:    Patient ID: [name], female    DOB: 1946/01/10, 65 y.o.   MRN: [id number]  HPI   65 year old patient who is seen today for followup. She fills the well following recent treatment for a right middle lobe pneumonia. She has only minimal cough and postnasal drip that she attributes to seasonal allergy symptoms. She has been followed by allergy in the past. She has had no fever or productive cough denies any shortness of breath chest pain or wheezing    Review of Systems  Constitutional: Negative.   HENT: Positive for congestion and postnasal drip. Negative for hearing loss, sore throat, rhinorrhea, dental problem, sinus pressure and tinnitus.   Eyes: Negative for pain, discharge and visual disturbance.  Respiratory: Positive for cough. Negative for shortness of breath.   Cardiovascular: Negative for chest pain, palpitations and leg swelling.  Gastrointestinal: Negative for nausea, vomiting, abdominal pain, diarrhea, constipation, blood in stool and abdominal distention.  Genitourinary: Negative for dysuria, urgency, frequency, hematuria, flank pain, vaginal bleeding, vaginal discharge, difficulty urinating, vaginal pain and pelvic pain.  Musculoskeletal: Negative for joint swelling, arthralgias and gait problem.  Skin: Negative for rash.  Neurological: Negative for dizziness, syncope, speech difficulty, weakness, numbness and headaches.  Hematological: Negative for adenopathy.  Psychiatric/Behavioral: Negative for behavioral problems, dysphoric mood and agitation. The patient is not nervous/anxious.        Objective:   Physical Exam  Constitutional: She is oriented to person, place, and time. She appears well-developed and well-nourished.  HENT:  Head: Normocephalic.  Right Ear: External ear normal.  Left Ear: External ear normal.  Mouth/Throat: Oropharynx is clear and moist.        The right tympanic membrane now appears normal  Eyes: Conjunctivae and EOM are  normal. Pupils are equal, round, and reactive to light.  Neck: Normal range of motion. Neck supple. No thyromegaly present.  Cardiovascular: Normal rate, regular rhythm, normal heart sounds and intact distal pulses.   Pulmonary/Chest: Effort normal and breath sounds normal.  Abdominal: Soft. Bowel sounds are normal. She exhibits no mass. There is no tenderness.  Musculoskeletal: Normal range of motion.  Lymphadenopathy:    She has no cervical adenopathy.  Neurological: She is alert and oriented to person, place, and time.  Skin: Skin is warm and dry. No rash noted.  Psychiatric: She has a normal mood and affect. Her behavior is normal.          Assessment & Plan:   status post right middle lobe pneumonia clinically resolved  COPD stable  Nonallergic rhinitis. Samples of Allegra dispensed   Return in 6 months 4 followup

## 2010-10-19 ENCOUNTER — Ambulatory Visit (INDEPENDENT_AMBULATORY_CARE_PROVIDER_SITE_OTHER): Payer: Medicare Other | Admitting: Family Medicine

## 2010-10-19 ENCOUNTER — Encounter: Payer: Self-pay | Admitting: Internal Medicine

## 2010-10-19 ENCOUNTER — Encounter: Payer: Self-pay | Admitting: Family Medicine

## 2010-10-19 VITALS — BP 110/60 | HR 84 | Temp 98.7°F | Wt 108.1 lb

## 2010-10-19 DIAGNOSIS — J069 Acute upper respiratory infection, unspecified: Secondary | ICD-10-CM

## 2010-10-19 NOTE — Progress Notes (Signed)
  Subjective:    Patient ID: [name], female    DOB: 1946-06-08, 65 y.o.   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  Started having chills and body aches in night. Mild cough, nonproductive. No change in SOB. No fever.  Has tried acetominophen 1000mg  few hours ago. Improvement with symtpoms.  Nothing makes them worse.   Has history of COPD and PNA in 08/2010   Review of Systems  Constitutional: Positive for fatigue. Negative for fever and chills.  HENT: Positive for rhinorrhea. Negative for ear pain, nosebleeds, congestion, sneezing, postnasal drip, sinus pressure and tinnitus.   Eyes: Negative for pain.  Respiratory: Negative for chest tightness and wheezing.   Cardiovascular: Negative for chest pain and leg swelling.  Gastrointestinal: Negative for abdominal pain.       Objective:   Physical Exam  Constitutional: Vital signs are normal. She appears well-developed and well-nourished. She is cooperative.  Non-toxic appearance. She does not appear ill. No distress.       Morbidly obese femalel in NAD  HENT:  Head: Normocephalic.  Right Ear: Hearing, tympanic membrane, external ear and ear canal normal.  Left Ear: Hearing, tympanic membrane, external ear and ear canal normal.  Nose: Mucosal edema and rhinorrhea present. Right sinus exhibits no maxillary sinus tenderness and no frontal sinus tenderness. Left sinus exhibits no maxillary sinus tenderness and no frontal sinus tenderness.  Mouth/Throat: Uvula is midline, oropharynx is clear and moist and mucous membranes are normal.  Eyes: Conjunctivae, EOM and lids are normal. Pupils are equal, round, and reactive to light. No foreign bodies found.  Neck: Trachea normal and normal range of motion. Neck supple. Carotid bruit is not present. No mass and no thyromegaly present.  Cardiovascular: Normal rate, regular rhythm, S1 normal, S2 normal, normal heart sounds, intact distal  pulses and normal pulses.  Exam reveals no gallop and no friction rub.   No murmur heard. Pulmonary/Chest: Effort normal and breath sounds normal. Not tachypneic. No respiratory distress. She has no decreased breath sounds. She has no wheezes. She has no rhonchi. She has no rales.  Abdominal: Soft. Normal appearance and bowel sounds are normal. She exhibits no distension, no fluid wave, no abdominal bruit and no mass. There is no hepatosplenomegaly. There is no tenderness. There is no rebound, no guarding and no CVA tenderness. No hernia.  Genitourinary: Vagina normal and uterus normal. No breast swelling, tenderness, discharge or bleeding. Pelvic exam was performed with patient prone. There is no rash, tenderness or lesion on the right labia. There is no rash, tenderness or lesion on the left labia. Uterus is not enlarged and not tender. Cervix exhibits motion tenderness. Cervix exhibits no discharge and no friability. Right adnexum displays no mass, no tenderness and no fullness. Left adnexum displays no mass, no tenderness and no fullness.  Lymphadenopathy:    She has no cervical adenopathy.    She has no axillary adenopathy.  Neurological: She is alert. She has normal strength. No cranial nerve deficit or sensory deficit.  Skin: Skin is warm, dry and intact. No rash noted.  Psychiatric: Her speech is normal and behavior is normal. Judgment normal. Her mood appears not anxious. Cognition and memory are normal. She does not exhibit a depressed mood.          Assessment & Plan:

## 2010-10-19 NOTE — Patient Instructions (Signed)
No sign of current PNA as pt has had in the past... But if fever >101.4 or worsening symptoms as opposed to improving in next 5-7 days.. Follow up with primary MD.  Rest , stay hydrated, tylenol for pain.  Go to ER if severe SOB.

## 2010-10-26 LAB — CBC
HCT: 40.6 % (ref 36.0–46.0)
HCT: 39 % (ref 36.0–46.0)
Hemoglobin: 14 g/dL (ref 12.0–15.0)
Hemoglobin: 13.3 g/dL (ref 12.0–15.0)
MCV: 98 fL (ref 78.0–100.0)
MCV: 98.5 fL (ref 78.0–100.0)
Platelets: 169 10*3/uL (ref 150–400)
RBC: 3.96 MIL/uL (ref 3.87–5.11)
RDW: 13.2 % (ref 11.5–15.5)

## 2010-10-26 LAB — COMPREHENSIVE METABOLIC PANEL
Albumin: 4.1 g/dL (ref 3.5–5.2)
CO2: 24 mEq/L (ref 19–32)
Calcium: 8.8 mg/dL (ref 8.4–10.5)
Chloride: 103 mEq/L (ref 96–112)
GFR calc Af Amer: >60 mL/min (ref >60)
Glucose, Bld: 113 mg/dL — ABNORMAL HIGH (ref 70–99)
Total Bilirubin: 0.8 mg/dL (ref 0.3–1.2)
Total Protein: 6.7 g/dL (ref 6.0–8.3)

## 2010-10-26 LAB — POCT CARDIAC MARKERS
CKMB, poc: <1 ng/mL — ABNORMAL LOW (ref 1.0–8.0)
Myoglobin, poc: 34.2 ng/mL (ref 12–200)
Myoglobin, poc: 41.1 ng/mL (ref 12–200)
Troponin i, poc: <0.05 ng/mL (ref 0.00–0.09)

## 2010-10-26 LAB — LIPID PANEL
Cholesterol: 217 mg/dL — ABNORMAL HIGH (ref 0–200)
HDL: 91 mg/dL (ref >39)
Total CHOL/HDL Ratio: 2.4 RATIO
Triglycerides: 65 mg/dL (ref <150)
VLDL: 13 mg/dL (ref 0–40)

## 2010-10-26 LAB — DIFFERENTIAL
Basophils Relative: 1 % (ref 0–1)
Eosinophils Absolute: 0.1 10*3/uL (ref 0.0–0.7)
Lymphocytes Relative: 15 % (ref 12–46)
Monocytes Absolute: 0.2 10*3/uL (ref 0.1–1.0)
Neutro Abs: 3.8 10*3/uL (ref 1.7–7.7)

## 2010-10-26 LAB — CARDIAC PANEL(CRET KIN+CKTOT+MB+TROPI)
CK, MB: 1.2 ng/mL (ref 0.3–4.0)
CK, MB: 1.3 ng/mL (ref 0.3–4.0)
Total CK: 53 U/L (ref 7–177)
Troponin I: 0.02 ng/mL (ref 0.00–0.06)
Troponin I: 0.01 ng/mL (ref 0.00–0.06)

## 2010-10-26 LAB — T4, FREE: Free T4: 0.87 ng/dL (ref 0.80–1.80)

## 2010-10-26 LAB — CK TOTAL AND CKMB (NOT AT ARMC): Relative Index: INVALID (ref 0.0–2.5)

## 2010-12-03 NOTE — H&P (Signed)
[name], [name]               ACCOUNT NO.:  [id number]   MEDICAL RECORD NO.:  [id number]          PATIENT TYPE:  INP   LOCATION:  2023                         FACILITY:  [facility]   PHYSICIAN:  Gerrit Friends. Dietrich Pates, MD, FACCDATE OF BIRTH:  19-Apr-1946   DATE OF ADMISSION:  03/04/2009  DATE OF DISCHARGE:                              HISTORY & PHYSICAL   CARDIOLOGIST:  New, being seen by Dr. Dietrich Pates.   PRIMARY CARE PHYSICIAN:  Gordy Savers, MD   Ms. [name] is a lovely 65 year old Caucasian female with no known history  of coronary artery disease.  She does have a history of depression and  panic attacks, remote history of tobacco use, COPD per previous chest x-  ray, and a history of pneumonia back in 2008, breast cancer status post  radiation in 2004, no chemotherapy treatment required.  [name] was in  her usual state of health this morning.  She got up, ate breakfast, went  to church.  She states she was sitting in church.  She thought it was  overly warm in church this morning.  She suddenly felt acute episode of  substernal burning, indigestion-type sensation, did not go away.  She  said it was pretty intense.  Then, she began to get clammy and  nauseated.  She stood up with her husband, they walked to the vestibule  on the back of the church.  She was walking.  He states she does slump  down to the floor and she was unresponsive for approximately 2 minutes.  EMS was called.  On arrival, blood pressure was 116/72, heart rate was  70.  The patient was responsive at that time and sitting outside on the  steps.  CBGs of 82.  The patient describes the chest discomfort as  substernal, burning almost like an acid reflux sensation.  The patient  states she does not recall how she ended up on the floor.  She does  notes she opened her eyes and EMS was standing over her.  She was  treated with four baby aspirin, one nitroglycerin was given with no  change in discomfort.  She does  have palpitations occasionally, but does  not recall having any significant palpitations prior to her syncopal  episode today.  She does recall having some blurred vision immediately  before.  In further review, [name] states she felt poorly yesterday.  She normally is very active, climbs two sets of steps in her house from  the basement to the second floor without any problems.  Lately, she has  also noticed that when she reaches the top of the second set of step,  she is short of breath and sometimes asked to stop to catch her breath.  This is entirely different from her usual activity.  She does vacuum,  laundry, etc, has no episodes of chest discomfort, presyncope, syncope,  lightheadedness, dizziness, or shortness of breath otherwise.  EKG  reviewed showing no acute ST or T-wave changes.  The patient does have  some minor ST depression in inferior leads, otherwise regular sinus  rhythm.  PAST MEDICAL HISTORY:  1. Depression.  2. Panic attacks.  3. Shingles.  4. COPD, which is new diagnosis to the patient.  5. Breast cancer, status post radiation in 2004.  6. Remote history of tobacco abuse.  7. Removal of her ovaries with uterus intact.   SOCIAL HISTORY:  She lives in [place] with her spouse.  She is a part-  time Manufacturing systems engineer.  She quit smoking years ago.  She has two adult  children.  She does consume vodka and tonic every night, sometimes she  states she has two.  She drinks two cups of caffeinated coffee a day.  Denies any illicit or substance use.   FAMILY HISTORY:  Mother alive at age 22 with early dementia.  Father  deceased at age 40, had a blood clot, had Parkinson disease also.  Brother with diabetes.   REVIEW OF SYSTEMS:  Positive for blurred vision, chest pain, dyspnea on  exertion, palpitations, syncope today, anxiety, nausea with episode  today, GERD symptoms with episode today and heat intolerance today.  All  other systems were reviewed and  negative.   ALLERGIES:  CODEINE, EPHEDRINE, and PENICILLIN.   MEDICATIONS:  1. Xanax 0.4 mg at bedtime.  2. Zoloft 50 mg at bedtime.  3. Vitamin C.  4. Fish oil.   PHYSICAL EXAMINATION:  VITAL SIGNS:  Temperature 97.7, heart rate 72,  respirations 16, blood pressure 153/86, saturating 100% on room air.  GENERAL:  The patient is in no acute distress.  She is very anxious,  tearful at times.  HEENT:  Unremarkable.  NECK:  Supple without bruits or JVD.  CARDIOVASCULAR:  S1, S2, regular rate and rhythm.  Pulses are 2+ and  equal without bruits.  LUNGS:  Clear to auscultation.  SKIN:  Warm and dry.  ABDOMEN:  Soft, nontender, positive bowel sounds.  LOWER EXTREMITIES:  Without clubbing, cyanosis, or edema.  NEUROLOGIC:  Alert and oriented x3.   Chest x-ray showing chronic lung disease and hyperaeration.  EKG; sinus  rhythm, minor, ST depression in inferior leads, otherwise regular sinus  rhythm.  Telemetry strip does show occasional PVC and PACs.  H and H  13.3 and 39, WBCs 4.8, platelets 169.  Potassium 4.4, creatinine 0.7,  glucose 113, point-of-care negative x1, magnesium 2.1.   IMPRESSION:  Syncope with unclear etiology.  A 65 year old female with  no history of significant anxiety x2 mixed drinks nightly.  Frequent  premature atrial contractions and premature ventricular contractions  noted on telemetry.  The patient will be admitted for observation.  Check TSH.  Rule out myocardial infarction.  Outpatient echo and stress  echocardiogram.  Carotid workup.  The patient reluctant to stay, but  eventually agreed for the 23-hour observation.  Dr. [place] Bing has  been in to examine and assess the patient and agrees with plan of care.  We will go ahead and initiate low-dose beta-blocker also.      Dorian Pod, ACNP      Gerrit Friends. Dietrich Pates, MD, [facility]  Electronically Signed    MB/MEDQ  D:  03/04/2009  T:  03/04/2009  Job:  [id number]

## 2010-12-03 NOTE — Discharge Summary (Signed)
[name], [name]               ACCOUNT NO.:  [id number]   MEDICAL RECORD NO.:  [id number]          PATIENT TYPE:  INP   LOCATION:  2023                         FACILITY:  [facility]   PHYSICIAN:  Marca Ancona, MD      DATE OF BIRTH:  02/18/1946   DATE OF ADMISSION:  03/04/2009  DATE OF DISCHARGE:  03/05/2009                               DISCHARGE SUMMARY   DISCHARGE DIAGNOSES:  1. Chest pain.  2. Presyncope.   SECONDARY DIAGNOSES:  1. Depression with panic attacks.  2. History of shingles.  3. Chronic obstructive pulmonary disease.  4. Remote history of tobacco use.  5. History of breast cancer treated with radiation.  6. Status post ovariectomy.  7. Allergy or intolerance to CODEINE, EPHEDRINE, and PENICILLIN.   TIME OF DISCHARGE:  Thirty four minutes.   HOSPITAL COURSE:  Ms. Standard is a 65 year old female with no previous  history of coronary artery disease.  She was in church yesterday and had  onset of presyncope as well as substernal burning.  She also had a brief  syncopal episode and EMS was called.  She was admitted for further  evaluation.   Her cardiac enzymes were negative for MI.  Orthostatic vital signs were  negative.  Other labs were within normal limits except for a nonfasting  blood sugar of 113 and an elevated total cholesterol of 217 with an LDL  of 113.  TSH and free T4 were within normal limits as well.   [name] had no further symptoms overnight.  On March 05, 2009, she  was evaluated by Dr. Shirlee Latch.  Her EKG was unchanged compared to the  prior one and she had some PVCs and PACs, but no critical arrhythmias.  He felt that her event sounded vasovagal and felt that no further  inpatient workup was indicated.  Dr. Shirlee Latch cleared Ms. Shanahan for  discharge with close outpatient followup arranged.   DISCHARGE INSTRUCTIONS:  1. Her activity level is to be increased gradually.  2. She is to stick to a low-sodium heart-healthy diet.  3. She is to follow up  with Dr. Shirlee Latch and an appointment will be      arranged.  4. She is to follow up with Dr. Amador Cunas as needed.  5. She is to get a full 2-D echocardiogram and a stress echo prior to      her appointment.   DISCHARGE MEDICATIONS:  1. Ibuprofen p.r.n.  2. Xanax 0.25 mg p.r.n.  3. Sertraline 50 mg daily.  4. Aspirin 81 mg daily.  5. Lopressor 12.5 mg b.i.d.      Theodore Demark, PA-C      Marca Ancona, MD  Electronically Signed    RB/MEDQ  D:  03/05/2009  T:  03/05/2009  Job:  [id number]   cc:   Gordy Savers, MD

## 2010-12-06 NOTE — Discharge Summary (Signed)
[name], [name]               ACCOUNT NO.:  [id number]   MEDICAL RECORD NO.:  [id number]          PATIENT TYPE:  INP   LOCATION:  6708                         FACILITY:  [facility]   PHYSICIAN:  Hillery Aldo, M.D.   DATE OF BIRTH:  07-Aug-1945   DATE OF ADMISSION:  09/14/2006  DATE OF DISCHARGE:  09/17/2006                               DISCHARGE SUMMARY   PRIMARY CARE PHYSICIAN:  None.   DISCHARGE DIAGNOSES:  1. Right lower lobe pneumonia.  2. History of breast cancer.  3. Normocytic anemia.  4. Weight loss.  5. Osteopenia.  6. Changes of chronic obstructive pulmonary disease on chest      radiography.   DISCHARGE MEDICATIONS:  1. Avelox 400 mg daily for 9 more days.  2. Tylenol 650 mg q.4 h. p.r.n.  3. Robitussin DM 10 mL q.4 h. p.r.n.   CONSULTATIONS:  None.   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  She presented to  the hospital with complaints of cough, fever, and progressive weakness.  On initial evaluation, she was found to have radiographic evidence of a  right upper lobe pneumonia.  She also had a leukocytosis with a left  shift and therefore was admitted for treatment.  For full details of the  HPI, please see the dictated report done by Dr. Hadley Pen.   PROCEDURES AND DIAGNOSTIC STUDIES:  1. CT scan of the chest, abdomen and pelvis on September 14, 2006,      showed right lower lobe pneumonia with associated patchy opacities      within the upper lungs bilaterally.  The CT scan of the abdomen was      unremarkable.  The CT scan of the pelvis showed small amount of      free fluid which was nonspecific.  2. Chest x-ray on September 15, 2006, showed right lower lobe pneumonia      and changes of COPD.  There was also skeletal osteopenia.   DISCHARGE LABORATORY VALUES:  CBC showed a white blood cell count 5.3,  hemoglobin 10.7, hematocrit 30.7, platelet 388 with an MCV of 94.6.  Sodium was 141, potassium 3.8, chloride 104, bicarb 30, BUN 5,  creatinine 0.53, glucose  97.   HOSPITAL COURSE BY PROBLEM:  PROBLEM #1 - RIGHT LOWER LOBE PNEUMONIA:  There was some concern given the patient's presentation of tuberculosis.  A PPD could not be placed secondary to history of allergic reaction and  PPD media.  Given this, chest radiography as well as CT scanning was  done which did not show any evidence of cavitary lesions.  The sputum  was sent for acid fast staining was negative.  Follow-up cultures are  pending.  Nevertheless, the patient responded well to Avelox with  resolution of her leukocytosis and fever.  She is beginning to feel  better.  At this juncture, she is stable for discharge home.  She will  complete an additional 9 days of treatments with  Avelox.  She was  instructed to remain out of work for another two weeks so that she can  give herself adequate time to recuperate.   PROBLEM #2 - HISTORY OF BREAST CANCER:  Because of her history of breast  cancer, CT scans of the abdomen and pelvis as well as the chest were  obtained to rule out possible recurrence of malignancy.  There is no  evidence of recurrence.   PROBLEM #3 - NORMOCYTIC ANEMIA:  The patient likely has anemia of  chronic disease.  No further diagnostic workup was taken in the hospital  but consideration for an outpatient evaluation should be made.   PROBLEM #4 - OSTEOPENIA:  The patient was advised that she would need a  DEXA scan.  She does have an OB/GYN that she is visits annually that  reportedly keeps up on these things.   DISPOSITION:  Again, the patient is stable for discharge home.  The  patient was advised to get set up with a primary care physician so that  she could have close follow-up including annual flu vaccine and a  pneumonia vaccine in the future.  The patient declines pneumococcal  vaccination presently.   She reports that she will follow up with a  physician in the [facility].      Hillery Aldo, M.D.  Electronically Signed     CR/MEDQ  D:  09/17/2006  T:  09/17/2006  Job:  [id number]

## 2010-12-06 NOTE — H&P (Signed)
[name], [name]               ACCOUNT NO.:  [id number]   MEDICAL RECORD NO.:  [id number]          PATIENT TYPE:  EMS   LOCATION:  MAJO                         FACILITY:  [facility]   PHYSICIAN:  Theresia Bough, MD       DATE OF BIRTH:  July 16, 1946   DATE OF ADMISSION:  09/14/2006  DATE OF DISCHARGE:                              HISTORY & PHYSICAL   PRESENTING COMPLAINT:  Cough, fever and weakness.   HISTORY OF PRESENT ILLNESS:  This is a 65 year old female patient who  went to urgent care this morning.  She had a chest x-ray done at the  urgent care which showed pneumonia and consolidation.  I was called from  the urgent care to admit the patient.  Patient has been having  occasional fever at home.  She also has occasional chills at home.  She  has been having cough which is not productive.  Patient has recently  been treated for bronchitis as well as for sinusitis.  She has had  antibiotics on two occasions this year because of suspected bronchitis.  Patient has not gotten better.  She has lost some weight.  She also said  appetite is low.  She denies nausea.  No vomiting.  No diarrhea.  No  hemoptysis.  No leg swelling.  No dysuria.  No headaches.  No dizziness.   PAST MEDICAL HISTORY:  Includes history of breast cancer which was early  stage.  Patient had radiation treatment in 2004.  She also had  colonoscopy in 2004 which was negative.   SOCIAL HISTORY:  She lives with her husband.  She is a Engineer, site.  She denies any smoking.  She denies any alcohol use.   FAMILY HISTORY:  Noncontributory.   HOME MEDICATIONS:  Tylenol.   SHE HAS ALLERGIES TO CODEINE OR EPHEDRINE.   PHYSICAL EXAMINATION:  VITAL SIGNS:  She is a 65 year old female patient  with a weight of 95 pounds.  Temperature of 97.1, blood pressure of  134/64, pulse of 103, respiration of 20, oxygen saturation 97% on room  air.  HEAD/NECK:  Shows pink conjunctivae.  She has no jaundice.  Her neck is  supple.  Her  mucous membranes are dry.  CHEST:  Moves with respiration.  Auscultation shows clear breath sounds.  No wheezing and no crackles.  CARDIOVASCULAR:  Shows normal heart sounds, no murmur, no gallop.  ABDOMEN:  Flat.  No tenderness, no masses palpable.  EXTREMITIES:  Show no edema.  No skin changes.  CENTRAL NERVOUS SYSTEM:  Patient is alert and oriented x3.  Power is 4/4  in all limbs.  Sensations are normal, speech is clear.   LABS CURRENTLY:  I am going to order CBC, CMP, TSH.  I am going to order sputum for AAFB.  She brought the CBC that was done at  office. This  shows WBC of 18.9  which is high.   ASSESSMENT:  Pneumonia.  Rule out tuberculosis.  Rule out lung cancer.  Rule out any lung abscess.  My plan is to admit to telemetry floor.  I  will  begin IV Levaquin 750 mg q.24.h.  I will also give her some IV  fluid.  Robitussin  liquid for cough.  Tylenol for possible pain.  I am going to do a CT  scan of the chest to rule out cancer or tuberculosis or to evaluate  pneumonia, will also check CT abd and pelvis .  I will also do a sputum  culture.  I will do sputum for AAFB.  Patient will be on respiratory  isolation for now.      Theresia Bough, MD  Electronically Signed     GA/MEDQ  D:  09/14/2006  T:  09/14/2006  Job:  [id number]

## 2010-12-06 NOTE — Op Note (Signed)
NAMELEOTTA, WEINGARTEN                         ACCOUNT NO.:  [id number]   MEDICAL RECORD NO.:  [id number]                   PATIENT TYPE:  AMB   LOCATION:  DSC                                  FACILITY:  [facility]   PHYSICIAN:  Currie Paris, M.D.           DATE OF BIRTH:  Dec 04, 1945   DATE OF PROCEDURE:  05/22/2003  DATE OF DISCHARGE:                                 OPERATIVE REPORT   [facility] [phone]   PREOPERATIVE DIAGNOSIS:  Calcifications, right breast.   POSTOPERATIVE DIAGNOSIS:  Calcifications, right breast.   OPERATION PERFORMED:  Needle guided excision, right breast calcifications.   SURGEON:  Currie Paris, M.D.   ANESTHESIA:  MAC.   INDICATIONS FOR PROCEDURE:  The patient is a 65 year old with some  moderately suspicious calcifications which were not amenable to core biopsy.   DESCRIPTION OF PROCEDURE:  The patient was seen in the holding area and had  no further questions.  Dr. Yolanda Bonine placed a guidewire which entered in the  upper inner quadrant of the right breast and tracked inferiorly.  On  palpation of the breast I thought the tip was going to be about the level of  the midareolar complex.   The patient was taken to the operating room.  After satisfactory intravenous  sedation, the breast was prepped and draped and anesthetized with 1%  Xylocaine.  I made a curvilinear incision below the guidewire entry site,  divided the subcutaneous tissues and identified the guidewire in the  subcutaneous and manipulated it into the wound.  At this point the guidewire  was seen entering actual breast tissue, so that was grasped with an Allis  and an excision taken.  The patient has relatively small breasts and just  taking a small area around the guidewire ended up taking full thickness  breast tissue right down to fascia.  I got around it and there were a couple  of areas near the tip.  I was not sure we had gotten calcifications, so I  suctioned those out, and a  little bit of firm feeling area that was a little  bit more on the nipple side of the wire which I took out.  This was sent for  specimen mammography.  Additional local  had been infiltrated as we worked and everything appeared to be dry using  cautery to dry it up.  I closed with some 3-0 Vicryl to close the subcu and  4-0 Monocryl subcuticular plus Dermabond.  Dr. Yolanda Bonine reported that the  calcifications seemed to be centrally located in the main portion of the  biopsy specimen.                                               Currie Paris, M.D.    CJS/MEDQ  D:  05/22/2003  T:  05/22/2003  Job:  [id number]   cc:   Rande Brunt. Eda Paschal, M.D.  [address]  [place]  [place] [address]  Fax: [phone]   Dr. Jeralyn Ruths

## 2010-12-10 ENCOUNTER — Emergency Department (HOSPITAL_COMMUNITY)
Admission: EM | Admit: 2010-12-10 | Discharge: 2010-12-10 | Disposition: A | Discharge status: Home / Self Care | Payer: Medicare Other | Attending: Emergency Medicine | Admitting: Emergency Medicine

## 2010-12-10 ENCOUNTER — Emergency Department (HOSPITAL_COMMUNITY): Payer: Medicare Other

## 2010-12-10 DIAGNOSIS — R059 Cough, unspecified: Secondary | ICD-10-CM | POA: Insufficient documentation

## 2010-12-10 DIAGNOSIS — R112 Nausea with vomiting, unspecified: Secondary | ICD-10-CM | POA: Insufficient documentation

## 2010-12-10 DIAGNOSIS — R509 Fever, unspecified: Secondary | ICD-10-CM | POA: Insufficient documentation

## 2010-12-10 DIAGNOSIS — R197 Diarrhea, unspecified: Secondary | ICD-10-CM | POA: Insufficient documentation

## 2010-12-10 DIAGNOSIS — B9789 Other viral agents as the cause of diseases classified elsewhere: Secondary | ICD-10-CM | POA: Insufficient documentation

## 2010-12-10 DIAGNOSIS — R05 Cough: Secondary | ICD-10-CM | POA: Insufficient documentation

## 2010-12-10 DIAGNOSIS — F341 Dysthymic disorder: Secondary | ICD-10-CM | POA: Insufficient documentation

## 2010-12-10 LAB — URINALYSIS, ROUTINE W REFLEX MICROSCOPIC
Bilirubin Urine: NEGATIVE
Protein, ur: NEGATIVE mg/dL
Urobilinogen, UA: 0.2 mg/dL (ref 0.0–1.0)

## 2010-12-10 LAB — COMPREHENSIVE METABOLIC PANEL
ALT: 13 U/L (ref 0–35)
AST: 19 U/L (ref 0–37)
Albumin: 4 g/dL (ref 3.5–5.2)
Alkaline Phosphatase: 95 U/L (ref 39–117)
Chloride: 94 mEq/L — ABNORMAL LOW (ref 96–112)
GFR calc Af Amer: >60 mL/min (ref >60)
Potassium: 4 mEq/L (ref 3.5–5.1)
Total Bilirubin: 0.6 mg/dL (ref 0.3–1.2)

## 2010-12-10 LAB — CBC
MCV: 94.1 fL (ref 78.0–100.0)
Platelets: 191 10*3/uL (ref 150–400)
RBC: 4.37 MIL/uL (ref 3.87–5.11)
WBC: 7.9 10*3/uL (ref 4.0–10.5)

## 2010-12-10 LAB — DIFFERENTIAL
Basophils Relative: 0 % (ref 0–1)
Eosinophils Absolute: 0 10*3/uL (ref 0.0–0.7)
Lymphs Abs: 0.5 10*3/uL — ABNORMAL LOW (ref 0.7–4.0)
Neutrophils Relative %: 89 % — ABNORMAL HIGH (ref 43–77)

## 2010-12-11 ENCOUNTER — Encounter: Payer: Self-pay | Admitting: Internal Medicine

## 2010-12-11 ENCOUNTER — Ambulatory Visit (INDEPENDENT_AMBULATORY_CARE_PROVIDER_SITE_OTHER): Payer: Medicare Other | Admitting: Internal Medicine

## 2010-12-11 VITALS — BP 110/60 | Temp 98.4°F | Wt 110.0 lb

## 2010-12-11 DIAGNOSIS — R197 Diarrhea, unspecified: Secondary | ICD-10-CM

## 2010-12-11 DIAGNOSIS — J449 Chronic obstructive pulmonary disease, unspecified: Secondary | ICD-10-CM

## 2010-12-11 DIAGNOSIS — J4489 Other specified chronic obstructive pulmonary disease: Secondary | ICD-10-CM

## 2010-12-11 MED ORDER — ONDANSETRON HCL 4 MG PO TABS: 4.0000 mg | ORAL_TABLET | Freq: Three times a day (TID) | ORAL | Status: DC | PRN

## 2010-12-11 MED ORDER — DIPHENOXYLATE-ATROPINE 2.5-0.025 MG PO TABS: 1.0000 | ORAL_TABLET | Freq: Four times a day (QID) | ORAL | Status: DC | PRN

## 2010-12-11 NOTE — Patient Instructions (Signed)
Drink as much fluid as you  can tolerate over the next few days The main concern with gastroenteritis is dehydration.  Drink  plenty of  fluids, and advance your diet  slowly to solids as you feel improved.  If you are unable to keep anything down or  Show  signs of dehydration such as dry, cracked lips, or  not urinating, please notify our office.  ALIGN ONE DAILY

## 2010-12-11 NOTE — Progress Notes (Signed)
  Subjective:    Patient ID: [name], female    DOB: 1946/01/13, 65 y.o.   MRN: [id number]  HPI 65 year old patient who was seen in the ED last night due to fever nausea vomiting and diarrhea. She was treated with IV fluids and anti-medics and today feels better she still is having some diarrhea she was treated a couple months ago for pneumonia but has not been on any real recent antibiotic therapy. She has tolerated a full liquid diet. No dominant pain after vomiting this morning.   Review of Systems  Constitutional: Negative.   HENT: Negative for hearing loss, congestion, sore throat, rhinorrhea, dental problem, sinus pressure and tinnitus.   Eyes: Negative for pain, discharge and visual disturbance.  Respiratory: Negative for cough and shortness of breath.   Cardiovascular: Negative for chest pain, palpitations and leg swelling.  Gastrointestinal: Positive for nausea and diarrhea. Negative for vomiting, abdominal pain, constipation, blood in stool and abdominal distention.  Genitourinary: Negative for dysuria, urgency, frequency, hematuria, flank pain, vaginal bleeding, vaginal discharge, difficulty urinating, vaginal pain and pelvic pain.  Musculoskeletal: Negative for joint swelling, arthralgias and gait problem.  Skin: Negative for rash.  Neurological: Negative for dizziness, syncope, speech difficulty, weakness, numbness and headaches.  Hematological: Negative for adenopathy.  Psychiatric/Behavioral: Negative for behavioral problems, dysphoric mood and agitation. The patient is not nervous/anxious.        Objective:   Physical Exam  Constitutional: She is oriented to person, place, and time. She appears well-developed and well-nourished.  HENT:  Head: Normocephalic.  Right Ear: External ear normal.  Left Ear: External ear normal.  Mouth/Throat: Oropharynx is clear and moist.  Eyes: Conjunctivae and EOM are normal. Pupils are equal, round, and reactive to light.  Neck:  Normal range of motion. Neck supple. No thyromegaly present.  Cardiovascular: Normal rate, regular rhythm, normal heart sounds and intact distal pulses.   Pulmonary/Chest: Effort normal and breath sounds normal.  Abdominal: Soft. Bowel sounds are normal. She exhibits no distension and no mass. There is no tenderness. There is no rebound.  Musculoskeletal: Normal range of motion.  Lymphadenopathy:    She has no cervical adenopathy.  Neurological: She is alert and oriented to person, place, and time.  Skin: Skin is warm and dry. No rash noted.  Psychiatric: She has a normal mood and affect. Her behavior is normal.          Assessment & Plan:   Diarrhea probably secondary to viral gastroenteritis. Will give a prescription for an antidiarrheal. We'll slowly advance diet Nausea COPD stable

## 2011-05-19 ENCOUNTER — Ambulatory Visit (INDEPENDENT_AMBULATORY_CARE_PROVIDER_SITE_OTHER): Payer: Medicare Other

## 2011-05-19 DIAGNOSIS — Z23 Encounter for immunization: Secondary | ICD-10-CM

## 2011-05-30 ENCOUNTER — Encounter: Payer: Self-pay | Admitting: Internal Medicine

## 2011-05-30 ENCOUNTER — Ambulatory Visit (INDEPENDENT_AMBULATORY_CARE_PROVIDER_SITE_OTHER): Payer: Medicare Other | Admitting: Internal Medicine

## 2011-05-30 VITALS — BP 120/80 | Temp 98.1°F | Wt 115.0 lb

## 2011-05-30 DIAGNOSIS — Z0000 Encounter for general adult medical examination without abnormal findings: Secondary | ICD-10-CM

## 2011-05-30 DIAGNOSIS — J449 Chronic obstructive pulmonary disease, unspecified: Secondary | ICD-10-CM

## 2011-05-30 DIAGNOSIS — Z23 Encounter for immunization: Secondary | ICD-10-CM

## 2011-05-30 DIAGNOSIS — F411 Generalized anxiety disorder: Secondary | ICD-10-CM

## 2011-05-30 NOTE — Patient Instructions (Signed)
It is important that you exercise regularly, at least 20 minutes 3 to 4 times per week.  If you develop chest pain or shortness of breath seek  medical attention.  Return in one year for follow-up   

## 2011-05-30 NOTE — Progress Notes (Signed)
  Subjective:    Patient ID: [name], female    DOB: 28-Nov-1945, 65 y.o.   MRN: [id number]  HPI  Wt Readings from Last 3 Encounters:  05/30/11 115 lb (52.164 kg)  12/11/10 110 lb (49.896 kg)  10/19/10 108 lb 1.9 oz (49.043 kg)   Review of Systems     Objective:   Physical Exam        Assessment & Plan:

## 2011-05-30 NOTE — Progress Notes (Signed)
  Subjective:    Patient ID: [name], female    DOB: 09-Dec-1945, 65 y.o.   MRN: [id number]  HPI 65 year old patient who is seen today for followup. She has a history of mild COPD palpitations mild anxiety disorder. She has done quite well without any concerns or complaints. She wanted a flu vaccine and other immunizations prior to the winter. Last year she had a difficult winter with frequent URIs. No concerns or complaints today.   Review of Systems  Constitutional: Negative.   HENT: Negative for hearing loss, congestion, sore throat, rhinorrhea, dental problem, sinus pressure and tinnitus.   Eyes: Negative for pain, discharge and visual disturbance.  Respiratory: Negative for cough and shortness of breath.   Cardiovascular: Negative for chest pain, palpitations and leg swelling.  Gastrointestinal: Negative for nausea, vomiting, abdominal pain, diarrhea, constipation, blood in stool and abdominal distention.  Genitourinary: Negative for dysuria, urgency, frequency, hematuria, flank pain, vaginal bleeding, vaginal discharge, difficulty urinating, vaginal pain and pelvic pain.  Musculoskeletal: Negative for joint swelling, arthralgias and gait problem.  Skin: Negative for rash.  Neurological: Negative for dizziness, syncope, speech difficulty, weakness, numbness and headaches.  Hematological: Negative for adenopathy.  Psychiatric/Behavioral: Negative for behavioral problems, dysphoric mood and agitation. The patient is not nervous/anxious.        Objective:   Physical Exam  Constitutional: She is oriented to person, place, and time. She appears well-developed and well-nourished.  HENT:  Head: Normocephalic.  Right Ear: External ear normal.  Left Ear: External ear normal.  Mouth/Throat: Oropharynx is clear and moist.  Eyes: Conjunctivae and EOM are normal. Pupils are equal, round, and reactive to light.  Neck: Normal range of motion. Neck supple. No thyromegaly present.    Cardiovascular: Normal rate, regular rhythm, normal heart sounds and intact distal pulses.   Pulmonary/Chest: Effort normal and breath sounds normal.  Abdominal: Soft. Bowel sounds are normal. She exhibits no mass. There is no tenderness.  Musculoskeletal: Normal range of motion.  Lymphadenopathy:    She has no cervical adenopathy.  Neurological: She is alert and oriented to person, place, and time.  Skin: Skin is warm and dry. No rash noted.  Psychiatric: She has a normal mood and affect. Her behavior is normal.          Assessment & Plan:   Mild COPD Anxiety disorder History palpitations  The patient clinically is doing quite well her medications were refilled. Return in one year or when necessary

## 2011-06-16 ENCOUNTER — Other Ambulatory Visit: Payer: Self-pay

## 2011-06-16 DIAGNOSIS — C50919 Malignant neoplasm of unspecified site of unspecified female breast: Secondary | ICD-10-CM

## 2011-06-25 ENCOUNTER — Encounter: Payer: Self-pay | Admitting: Internal Medicine

## 2011-06-26 ENCOUNTER — Other Ambulatory Visit: Payer: Self-pay | Admitting: Obstetrics and Gynecology

## 2011-06-26 DIAGNOSIS — C50919 Malignant neoplasm of unspecified site of unspecified female breast: Secondary | ICD-10-CM

## 2011-06-30 ENCOUNTER — Ambulatory Visit (INDEPENDENT_AMBULATORY_CARE_PROVIDER_SITE_OTHER): Payer: Medicare Other | Admitting: Internal Medicine

## 2011-06-30 ENCOUNTER — Ambulatory Visit (INDEPENDENT_AMBULATORY_CARE_PROVIDER_SITE_OTHER)
Admission: RE | Admit: 2011-06-30 | Discharge: 2011-06-30 | Disposition: A | Discharge status: Home / Self Care | Payer: Medicare Other | Source: Ambulatory Visit | Attending: Internal Medicine | Admitting: Internal Medicine

## 2011-06-30 VITALS — BP 112/68 | HR 80 | Temp 100.3°F | Wt 112.0 lb

## 2011-06-30 DIAGNOSIS — R059 Cough, unspecified: Secondary | ICD-10-CM

## 2011-06-30 DIAGNOSIS — R05 Cough: Secondary | ICD-10-CM

## 2011-06-30 MED ORDER — CEFUROXIME AXETIL 500 MG PO TABS: 500.0000 mg | ORAL_TABLET | Freq: Two times a day (BID) | ORAL | Status: AC

## 2011-06-30 NOTE — Patient Instructions (Signed)
Please call our office if your symptoms do not improve or gets worse.  

## 2011-06-30 NOTE — Assessment & Plan Note (Signed)
65 year old white female with a productive cough x3 days. Her chest is clear on exam. I suspect her symptoms are from sinusitis. Treat with cefuroxime 500 mg twice daily x10 days.  She has Hycodan at home that she can use for cough. Unclear whether her left-sided chest pain is from strain of intercostal muscles. Obtain chest x-ray. Patient advised to call office if symptoms persist or worsen.

## 2011-06-30 NOTE — Progress Notes (Signed)
Subjective:    Patient ID: [name], female    DOB: October 14, 1945, 65 y.o.   MRN: [id number]  HPI  65 year old white female complains of productive cough x3 days. She complains of postnasal drip which settles onto her chest at night. She has tried over-the-counter sinus medication and Delsym without significant relief. Patient notes low-grade fever and she is worried that she may be developing pneumonia. She notes left-sided chest pain with cough.  She has had low-grade fever.  Review of Systems Negative for shortness of breath    Past Medical History  Diagnosis Date  . HERPES ZOSTER 10/12/2008  . ANXIETY 10/12/2008  . COPD 03/26/2009  . Cancer     breast  . Palpitations   . Lumbago   . Acute upper respiratory infections of unspecified site   . Urinary tract infection, site not specified   . Syncopal episodes 8/10    "presyncopal" prbly vasovagal-MI ruled out; EKG normal; telemetry with PVC's/PAC's. Stress echo-no evidence for ischemia. EF 65-70%. No valvular abnormalities, no pulm. HTN    History   Social History  . Marital Status: Married    Spouse Name: N/A    Number of Children: 2  . Years of Education: N/A   Occupational History  . Part-time Manufacturing systems engineer    Social History Main Topics  . Smoking status: Former Games developer  . Smokeless tobacco: Not on file  . Alcohol Use: 0.0 oz/week    7-14 Glasses of wine per week     wodka tonic every night, sometimes 2.   . Drug Use: No  . Sexually Active: Not on file   Other Topics Concern  . Not on file   Social History Narrative  . No narrative on file    Past Surgical History  Procedure Date  . Oophorectomy     Family History  Problem Relation Age of Onset  . Dementia Mother   . Parkinsonism Father   . Diabetes Brother     Allergies  Allergen Reactions  . Codeine Phosphate   . Ephedrine Hcl   . Penicillins     REACTION: per allergy test    Current Outpatient Prescriptions on File Prior to Visit    Medication Sig Dispense Refill  . ALPRAZolam (XANAX) 0.5 MG tablet Take 0.5 mg by mouth 2 (two) times daily as needed.        . metoprolol (LOPRESSOR) 25 MG tablet Take 25 mg by mouth 2 (two) times daily. Take 1/2 tablet twice daily       . sertraline (ZOLOFT) 50 MG tablet Take 50 mg by mouth daily.          BP 112/68  Pulse 80  Temp(Src) 100.3 F (37.9 C) (Oral)  Wt 112 lb (50.803 kg)  SpO2 98%    Objective:   Physical Exam  Constitutional: She appears well-developed and well-nourished. No distress.  HENT:  Head: Normocephalic and atraumatic.  Right Ear: External ear normal.  Left Ear: External ear normal.  Mouth/Throat: No oropharyngeal exudate.       Oropharyngeal erythema  Neck: Normal range of motion.  Cardiovascular: Normal rate, regular rhythm and normal heart sounds.   Pulmonary/Chest: Effort normal and breath sounds normal. She has no wheezes. She has no rales.  Lymphadenopathy:    She has no cervical adenopathy.  Skin: Skin is warm and dry.  Psychiatric: She has a normal mood and affect. Her behavior is normal.          Assessment &  Plan:

## 2011-07-01 ENCOUNTER — Telehealth: Payer: Self-pay | Admitting: Internal Medicine

## 2011-07-01 NOTE — Telephone Encounter (Signed)
Call pt - CXR negative for pneumonia

## 2011-07-01 NOTE — Telephone Encounter (Signed)
Pt hus is request chest xray results

## 2011-07-02 NOTE — Telephone Encounter (Signed)
LMTCB

## 2011-07-02 NOTE — Telephone Encounter (Signed)
Was pt contacted re:  cxr results?

## 2011-07-02 NOTE — Telephone Encounter (Signed)
Pt aware.

## 2011-09-04 DIAGNOSIS — J019 Acute sinusitis, unspecified: Secondary | ICD-10-CM | POA: Diagnosis not present

## 2011-09-04 DIAGNOSIS — J310 Chronic rhinitis: Secondary | ICD-10-CM | POA: Diagnosis not present

## 2011-10-07 ENCOUNTER — Other Ambulatory Visit: Payer: Self-pay | Admitting: Internal Medicine

## 2012-01-05 DIAGNOSIS — M2020 Hallux rigidus, unspecified foot: Secondary | ICD-10-CM | POA: Diagnosis not present

## 2012-01-05 DIAGNOSIS — M779 Enthesopathy, unspecified: Secondary | ICD-10-CM | POA: Diagnosis not present

## 2012-01-21 DIAGNOSIS — M2020 Hallux rigidus, unspecified foot: Secondary | ICD-10-CM | POA: Diagnosis not present

## 2012-02-03 DIAGNOSIS — M21619 Bunion of unspecified foot: Secondary | ICD-10-CM | POA: Diagnosis not present

## 2012-02-03 DIAGNOSIS — M2020 Hallux rigidus, unspecified foot: Secondary | ICD-10-CM | POA: Diagnosis not present

## 2012-02-03 DIAGNOSIS — M205X9 Other deformities of toe(s) (acquired), unspecified foot: Secondary | ICD-10-CM | POA: Diagnosis not present

## 2012-02-03 DIAGNOSIS — M898X9 Other specified disorders of bone, unspecified site: Secondary | ICD-10-CM | POA: Diagnosis not present

## 2012-02-03 DIAGNOSIS — I499 Cardiac arrhythmia, unspecified: Secondary | ICD-10-CM | POA: Diagnosis not present

## 2012-02-11 DIAGNOSIS — R609 Edema, unspecified: Secondary | ICD-10-CM | POA: Diagnosis not present

## 2012-03-03 DIAGNOSIS — R609 Edema, unspecified: Secondary | ICD-10-CM | POA: Diagnosis not present

## 2012-04-08 DIAGNOSIS — R609 Edema, unspecified: Secondary | ICD-10-CM | POA: Diagnosis not present

## 2012-04-13 ENCOUNTER — Other Ambulatory Visit: Payer: Self-pay | Admitting: Internal Medicine

## 2012-05-06 DIAGNOSIS — M67919 Unspecified disorder of synovium and tendon, unspecified shoulder: Secondary | ICD-10-CM | POA: Diagnosis not present

## 2012-05-06 DIAGNOSIS — M7500 Adhesive capsulitis of unspecified shoulder: Secondary | ICD-10-CM | POA: Diagnosis not present

## 2012-05-06 DIAGNOSIS — M719 Bursopathy, unspecified: Secondary | ICD-10-CM | POA: Diagnosis not present

## 2012-05-25 ENCOUNTER — Ambulatory Visit (INDEPENDENT_AMBULATORY_CARE_PROVIDER_SITE_OTHER): Payer: Medicare Other | Admitting: Internal Medicine

## 2012-05-25 ENCOUNTER — Encounter: Payer: Self-pay | Admitting: Internal Medicine

## 2012-05-25 VITALS — BP 110/76 | Temp 98.1°F | Wt 117.0 lb

## 2012-05-25 DIAGNOSIS — J449 Chronic obstructive pulmonary disease, unspecified: Secondary | ICD-10-CM

## 2012-05-25 DIAGNOSIS — Z23 Encounter for immunization: Secondary | ICD-10-CM | POA: Diagnosis not present

## 2012-05-25 DIAGNOSIS — F411 Generalized anxiety disorder: Secondary | ICD-10-CM | POA: Diagnosis not present

## 2012-05-25 MED ORDER — METOPROLOL TARTRATE 25 MG PO TABS: 25.0000 mg | ORAL_TABLET | Freq: Two times a day (BID) | ORAL | Status: DC

## 2012-05-25 MED ORDER — SERTRALINE HCL 50 MG PO TABS: 50.0000 mg | ORAL_TABLET | Freq: Every day | ORAL | Status: DC

## 2012-05-25 MED ORDER — METOPROLOL TARTRATE 25 MG PO TABS: ORAL_TABLET | ORAL | Status: DC

## 2012-05-25 MED ORDER — ALPRAZOLAM 0.5 MG PO TABS: 0.5000 mg | ORAL_TABLET | Freq: Every evening | ORAL | Status: DC | PRN

## 2012-05-25 MED ORDER — ALPRAZOLAM 0.5 MG PO TABS: 5.0000 mg | ORAL_TABLET | Freq: Every evening | ORAL | Status: DC | PRN

## 2012-05-25 NOTE — Patient Instructions (Signed)
Limit your sodium (Salt) intake    It is important that you exercise regularly, at least 20 minutes 3 to 4 times per week.  If you develop chest pain or shortness of breath seek  medical attention.  Return in one year for follow-up   

## 2012-05-25 NOTE — Progress Notes (Signed)
Subjective:    Patient ID: [name], female    DOB: 12/26/1945, 66 y.o.   MRN: [id number]  HPI  66 year old patient who has enjoyed the excellent health. She has a history of mild anxiety disorder COPD. She has done quite well on her present regimen and has no concerns or complaints  Past Medical History  Diagnosis Date  . HERPES ZOSTER 10/12/2008  . ANXIETY 10/12/2008  . COPD 03/26/2009  . Cancer     breast  . Palpitations   . Lumbago   . Acute upper respiratory infections of unspecified site   . Urinary tract infection, site not specified   . Syncopal episodes 8/10    "presyncopal" prbly vasovagal-MI ruled out; EKG normal; telemetry with PVC's/PAC's. Stress echo-no evidence for ischemia. EF 65-70%. No valvular abnormalities, no pulm. HTN    History   Social History  . Marital Status: Married    Spouse Name: N/A    Number of Children: 2  . Years of Education: N/A   Occupational History  . Part-time Manufacturing systems engineer    Social History Main Topics  . Smoking status: Former Games developer  . Smokeless tobacco: Not on file  . Alcohol Use: 0.0 oz/week    7-14 Glasses of wine per week     Comment: wodka tonic every night, sometimes 2.   . Drug Use: No  . Sexually Active: Not on file   Other Topics Concern  . Not on file   Social History Narrative  . No narrative on file    Past Surgical History  Procedure Date  . Oophorectomy     Family History  Problem Relation Age of Onset  . Dementia Mother   . Parkinsonism Father   . Diabetes Brother     Allergies  Allergen Reactions  . Codeine Phosphate   . Ephedrine Hcl   . Penicillins     REACTION: per allergy test    Current Outpatient Prescriptions on File Prior to Visit  Medication Sig Dispense Refill  . [DISCONTINUED] metoprolol tartrate (LOPRESSOR) 25 MG tablet TAKE 1/2 TABLET BY MOUTH TWICE DAILY  90 tablet  0  . [DISCONTINUED] sertraline (ZOLOFT) 50 MG tablet TAKE 1 TABLET BY MOUTH EVERY MORNING  90 tablet  0     BP 110/76  Temp 98.1 F (36.7 C) (Oral)  Wt 117 lb (53.071 kg)     Wt Readings from Last 3 Encounters:  05/25/12 117 lb (53.071 kg)  06/30/11 112 lb (50.803 kg)  05/30/11 115 lb (52.164 kg)    Review of Systems  Constitutional: Negative.   HENT: Negative for hearing loss, congestion, sore throat, rhinorrhea, dental problem, sinus pressure and tinnitus.   Eyes: Negative for pain, discharge and visual disturbance.  Respiratory: Negative for cough and shortness of breath.   Cardiovascular: Negative for chest pain, palpitations and leg swelling.  Gastrointestinal: Negative for nausea, vomiting, abdominal pain, diarrhea, constipation, blood in stool and abdominal distention.  Genitourinary: Negative for dysuria, urgency, frequency, hematuria, flank pain, vaginal bleeding, vaginal discharge, difficulty urinating, vaginal pain and pelvic pain.  Musculoskeletal: Negative for joint swelling, arthralgias and gait problem.  Skin: Negative for rash.  Neurological: Negative for dizziness, syncope, speech difficulty, weakness, numbness and headaches.  Hematological: Negative for adenopathy.  Psychiatric/Behavioral: Negative for behavioral problems, dysphoric mood and agitation. The patient is not nervous/anxious.        Objective:   Physical Exam  Constitutional: She is oriented to person, place, and time. She appears well-developed and  well-nourished.  HENT:  Head: Normocephalic.  Right Ear: External ear normal.  Left Ear: External ear normal.  Mouth/Throat: Oropharynx is clear and moist.  Eyes: Conjunctivae normal and EOM are normal. Pupils are equal, round, and reactive to light.  Neck: Normal range of motion. Neck supple. No thyromegaly present.  Cardiovascular: Normal rate, regular rhythm, normal heart sounds and intact distal pulses.   Pulmonary/Chest: Effort normal and breath sounds normal.  Abdominal: Soft. Bowel sounds are normal. She exhibits no mass. There is no  tenderness.  Musculoskeletal: Normal range of motion.  Lymphadenopathy:    She has no cervical adenopathy.  Neurological: She is alert and oriented to person, place, and time.  Skin: Skin is warm and dry. No rash noted.  Psychiatric: She has a normal mood and affect. Her behavior is normal.          Assessment & Plan:   Anxiety disorder History of COPD  Medications updated Will schedule for complete CPX

## 2012-06-03 DIAGNOSIS — M2020 Hallux rigidus, unspecified foot: Secondary | ICD-10-CM | POA: Diagnosis not present

## 2012-06-07 DIAGNOSIS — M719 Bursopathy, unspecified: Secondary | ICD-10-CM | POA: Diagnosis not present

## 2012-06-07 DIAGNOSIS — M7500 Adhesive capsulitis of unspecified shoulder: Secondary | ICD-10-CM | POA: Diagnosis not present

## 2012-06-07 DIAGNOSIS — M67919 Unspecified disorder of synovium and tendon, unspecified shoulder: Secondary | ICD-10-CM | POA: Diagnosis not present

## 2012-06-21 DIAGNOSIS — J209 Acute bronchitis, unspecified: Secondary | ICD-10-CM | POA: Diagnosis not present

## 2012-06-21 DIAGNOSIS — J310 Chronic rhinitis: Secondary | ICD-10-CM | POA: Diagnosis not present

## 2012-06-30 ENCOUNTER — Other Ambulatory Visit: Payer: Self-pay | Admitting: Gynecology

## 2012-06-30 ENCOUNTER — Other Ambulatory Visit: Payer: Self-pay | Admitting: *Deleted

## 2012-06-30 DIAGNOSIS — Z853 Personal history of malignant neoplasm of breast: Secondary | ICD-10-CM | POA: Diagnosis not present

## 2012-06-30 LAB — HM MAMMOGRAPHY

## 2012-07-01 ENCOUNTER — Encounter: Payer: Self-pay | Admitting: Internal Medicine

## 2012-07-21 HISTORY — PX: ROTATOR CUFF REPAIR: SHX139

## 2012-07-29 DIAGNOSIS — M719 Bursopathy, unspecified: Secondary | ICD-10-CM | POA: Diagnosis not present

## 2012-07-29 DIAGNOSIS — M67919 Unspecified disorder of synovium and tendon, unspecified shoulder: Secondary | ICD-10-CM | POA: Diagnosis not present

## 2012-07-29 DIAGNOSIS — M7500 Adhesive capsulitis of unspecified shoulder: Secondary | ICD-10-CM | POA: Diagnosis not present

## 2012-08-23 DIAGNOSIS — M67919 Unspecified disorder of synovium and tendon, unspecified shoulder: Secondary | ICD-10-CM | POA: Diagnosis not present

## 2012-08-23 DIAGNOSIS — M7500 Adhesive capsulitis of unspecified shoulder: Secondary | ICD-10-CM | POA: Diagnosis not present

## 2012-08-23 DIAGNOSIS — M719 Bursopathy, unspecified: Secondary | ICD-10-CM | POA: Diagnosis not present

## 2012-09-23 DIAGNOSIS — M5030 Other cervical disc degeneration, unspecified cervical region: Secondary | ICD-10-CM | POA: Diagnosis not present

## 2012-10-18 ENCOUNTER — Other Ambulatory Visit: Payer: Self-pay | Admitting: Dermatology

## 2012-10-18 DIAGNOSIS — L820 Inflamed seborrheic keratosis: Secondary | ICD-10-CM | POA: Diagnosis not present

## 2012-10-18 DIAGNOSIS — B009 Herpesviral infection, unspecified: Secondary | ICD-10-CM | POA: Diagnosis not present

## 2012-10-18 DIAGNOSIS — D485 Neoplasm of uncertain behavior of skin: Secondary | ICD-10-CM | POA: Diagnosis not present

## 2012-10-28 DIAGNOSIS — M24119 Other articular cartilage disorders, unspecified shoulder: Secondary | ICD-10-CM | POA: Diagnosis not present

## 2012-10-28 DIAGNOSIS — M25819 Other specified joint disorders, unspecified shoulder: Secondary | ICD-10-CM | POA: Diagnosis not present

## 2012-10-28 DIAGNOSIS — M75100 Unspecified rotator cuff tear or rupture of unspecified shoulder, not specified as traumatic: Secondary | ICD-10-CM | POA: Diagnosis not present

## 2012-10-28 DIAGNOSIS — IMO0002 Reserved for concepts with insufficient information to code with codable children: Secondary | ICD-10-CM | POA: Diagnosis not present

## 2012-10-28 DIAGNOSIS — M75120 Complete rotator cuff tear or rupture of unspecified shoulder, not specified as traumatic: Secondary | ICD-10-CM | POA: Diagnosis not present

## 2012-10-28 DIAGNOSIS — M75110 Incomplete rotator cuff tear or rupture of unspecified shoulder, not specified as traumatic: Secondary | ICD-10-CM | POA: Diagnosis not present

## 2012-10-28 DIAGNOSIS — M7500 Adhesive capsulitis of unspecified shoulder: Secondary | ICD-10-CM | POA: Diagnosis not present

## 2012-10-28 DIAGNOSIS — G8918 Other acute postprocedural pain: Secondary | ICD-10-CM | POA: Diagnosis not present

## 2012-10-29 DIAGNOSIS — M7500 Adhesive capsulitis of unspecified shoulder: Secondary | ICD-10-CM | POA: Diagnosis not present

## 2012-10-29 DIAGNOSIS — M719 Bursopathy, unspecified: Secondary | ICD-10-CM | POA: Diagnosis not present

## 2012-10-29 DIAGNOSIS — M25669 Stiffness of unspecified knee, not elsewhere classified: Secondary | ICD-10-CM | POA: Diagnosis not present

## 2012-10-29 DIAGNOSIS — M67919 Unspecified disorder of synovium and tendon, unspecified shoulder: Secondary | ICD-10-CM | POA: Diagnosis not present

## 2012-10-29 DIAGNOSIS — M25519 Pain in unspecified shoulder: Secondary | ICD-10-CM | POA: Diagnosis not present

## 2012-11-01 DIAGNOSIS — M25669 Stiffness of unspecified knee, not elsewhere classified: Secondary | ICD-10-CM | POA: Diagnosis not present

## 2012-11-01 DIAGNOSIS — M7500 Adhesive capsulitis of unspecified shoulder: Secondary | ICD-10-CM | POA: Diagnosis not present

## 2012-11-01 DIAGNOSIS — M25519 Pain in unspecified shoulder: Secondary | ICD-10-CM | POA: Diagnosis not present

## 2012-11-02 DIAGNOSIS — M25519 Pain in unspecified shoulder: Secondary | ICD-10-CM | POA: Diagnosis not present

## 2012-11-02 DIAGNOSIS — M25669 Stiffness of unspecified knee, not elsewhere classified: Secondary | ICD-10-CM | POA: Diagnosis not present

## 2012-11-02 DIAGNOSIS — M7500 Adhesive capsulitis of unspecified shoulder: Secondary | ICD-10-CM | POA: Diagnosis not present

## 2012-11-03 DIAGNOSIS — M719 Bursopathy, unspecified: Secondary | ICD-10-CM | POA: Diagnosis not present

## 2012-11-03 DIAGNOSIS — M7500 Adhesive capsulitis of unspecified shoulder: Secondary | ICD-10-CM | POA: Diagnosis not present

## 2012-11-03 DIAGNOSIS — M25519 Pain in unspecified shoulder: Secondary | ICD-10-CM | POA: Diagnosis not present

## 2012-11-03 DIAGNOSIS — M67919 Unspecified disorder of synovium and tendon, unspecified shoulder: Secondary | ICD-10-CM | POA: Diagnosis not present

## 2012-11-03 DIAGNOSIS — M25669 Stiffness of unspecified knee, not elsewhere classified: Secondary | ICD-10-CM | POA: Diagnosis not present

## 2012-11-04 DIAGNOSIS — M25669 Stiffness of unspecified knee, not elsewhere classified: Secondary | ICD-10-CM | POA: Diagnosis not present

## 2012-11-04 DIAGNOSIS — M7500 Adhesive capsulitis of unspecified shoulder: Secondary | ICD-10-CM | POA: Diagnosis not present

## 2012-11-04 DIAGNOSIS — M719 Bursopathy, unspecified: Secondary | ICD-10-CM | POA: Diagnosis not present

## 2012-11-04 DIAGNOSIS — M67919 Unspecified disorder of synovium and tendon, unspecified shoulder: Secondary | ICD-10-CM | POA: Diagnosis not present

## 2012-11-04 DIAGNOSIS — M25519 Pain in unspecified shoulder: Secondary | ICD-10-CM | POA: Diagnosis not present

## 2012-11-08 DIAGNOSIS — M7500 Adhesive capsulitis of unspecified shoulder: Secondary | ICD-10-CM | POA: Diagnosis not present

## 2012-11-08 DIAGNOSIS — M25519 Pain in unspecified shoulder: Secondary | ICD-10-CM | POA: Diagnosis not present

## 2012-11-08 DIAGNOSIS — M719 Bursopathy, unspecified: Secondary | ICD-10-CM | POA: Diagnosis not present

## 2012-11-08 DIAGNOSIS — M67919 Unspecified disorder of synovium and tendon, unspecified shoulder: Secondary | ICD-10-CM | POA: Diagnosis not present

## 2012-11-08 DIAGNOSIS — M25669 Stiffness of unspecified knee, not elsewhere classified: Secondary | ICD-10-CM | POA: Diagnosis not present

## 2012-11-10 DIAGNOSIS — M7500 Adhesive capsulitis of unspecified shoulder: Secondary | ICD-10-CM | POA: Diagnosis not present

## 2012-11-10 DIAGNOSIS — M719 Bursopathy, unspecified: Secondary | ICD-10-CM | POA: Diagnosis not present

## 2012-11-10 DIAGNOSIS — M67919 Unspecified disorder of synovium and tendon, unspecified shoulder: Secondary | ICD-10-CM | POA: Diagnosis not present

## 2012-11-10 DIAGNOSIS — M25519 Pain in unspecified shoulder: Secondary | ICD-10-CM | POA: Diagnosis not present

## 2012-11-10 DIAGNOSIS — M25669 Stiffness of unspecified knee, not elsewhere classified: Secondary | ICD-10-CM | POA: Diagnosis not present

## 2012-11-12 DIAGNOSIS — M7500 Adhesive capsulitis of unspecified shoulder: Secondary | ICD-10-CM | POA: Diagnosis not present

## 2012-11-12 DIAGNOSIS — M25669 Stiffness of unspecified knee, not elsewhere classified: Secondary | ICD-10-CM | POA: Diagnosis not present

## 2012-11-12 DIAGNOSIS — M25519 Pain in unspecified shoulder: Secondary | ICD-10-CM | POA: Diagnosis not present

## 2012-11-12 DIAGNOSIS — M719 Bursopathy, unspecified: Secondary | ICD-10-CM | POA: Diagnosis not present

## 2012-11-12 DIAGNOSIS — M67919 Unspecified disorder of synovium and tendon, unspecified shoulder: Secondary | ICD-10-CM | POA: Diagnosis not present

## 2012-11-15 DIAGNOSIS — M7500 Adhesive capsulitis of unspecified shoulder: Secondary | ICD-10-CM | POA: Diagnosis not present

## 2012-11-15 DIAGNOSIS — M25669 Stiffness of unspecified knee, not elsewhere classified: Secondary | ICD-10-CM | POA: Diagnosis not present

## 2012-11-15 DIAGNOSIS — M25519 Pain in unspecified shoulder: Secondary | ICD-10-CM | POA: Diagnosis not present

## 2012-11-15 DIAGNOSIS — M67919 Unspecified disorder of synovium and tendon, unspecified shoulder: Secondary | ICD-10-CM | POA: Diagnosis not present

## 2012-11-15 DIAGNOSIS — M719 Bursopathy, unspecified: Secondary | ICD-10-CM | POA: Diagnosis not present

## 2012-11-17 DIAGNOSIS — M7500 Adhesive capsulitis of unspecified shoulder: Secondary | ICD-10-CM | POA: Diagnosis not present

## 2012-11-17 DIAGNOSIS — M67919 Unspecified disorder of synovium and tendon, unspecified shoulder: Secondary | ICD-10-CM | POA: Diagnosis not present

## 2012-11-17 DIAGNOSIS — M25519 Pain in unspecified shoulder: Secondary | ICD-10-CM | POA: Diagnosis not present

## 2012-11-17 DIAGNOSIS — M25669 Stiffness of unspecified knee, not elsewhere classified: Secondary | ICD-10-CM | POA: Diagnosis not present

## 2012-11-17 DIAGNOSIS — M719 Bursopathy, unspecified: Secondary | ICD-10-CM | POA: Diagnosis not present

## 2012-11-19 DIAGNOSIS — M67919 Unspecified disorder of synovium and tendon, unspecified shoulder: Secondary | ICD-10-CM | POA: Diagnosis not present

## 2012-11-19 DIAGNOSIS — M25519 Pain in unspecified shoulder: Secondary | ICD-10-CM | POA: Diagnosis not present

## 2012-11-19 DIAGNOSIS — M25669 Stiffness of unspecified knee, not elsewhere classified: Secondary | ICD-10-CM | POA: Diagnosis not present

## 2012-11-19 DIAGNOSIS — M7500 Adhesive capsulitis of unspecified shoulder: Secondary | ICD-10-CM | POA: Diagnosis not present

## 2012-11-19 DIAGNOSIS — M719 Bursopathy, unspecified: Secondary | ICD-10-CM | POA: Diagnosis not present

## 2012-11-22 DIAGNOSIS — M719 Bursopathy, unspecified: Secondary | ICD-10-CM | POA: Diagnosis not present

## 2012-11-22 DIAGNOSIS — M7500 Adhesive capsulitis of unspecified shoulder: Secondary | ICD-10-CM | POA: Diagnosis not present

## 2012-11-22 DIAGNOSIS — M67919 Unspecified disorder of synovium and tendon, unspecified shoulder: Secondary | ICD-10-CM | POA: Diagnosis not present

## 2012-11-22 DIAGNOSIS — M25669 Stiffness of unspecified knee, not elsewhere classified: Secondary | ICD-10-CM | POA: Diagnosis not present

## 2012-11-22 DIAGNOSIS — M25519 Pain in unspecified shoulder: Secondary | ICD-10-CM | POA: Diagnosis not present

## 2012-11-26 DIAGNOSIS — M25669 Stiffness of unspecified knee, not elsewhere classified: Secondary | ICD-10-CM | POA: Diagnosis not present

## 2012-11-26 DIAGNOSIS — M25519 Pain in unspecified shoulder: Secondary | ICD-10-CM | POA: Diagnosis not present

## 2012-11-26 DIAGNOSIS — M67919 Unspecified disorder of synovium and tendon, unspecified shoulder: Secondary | ICD-10-CM | POA: Diagnosis not present

## 2012-11-26 DIAGNOSIS — M7500 Adhesive capsulitis of unspecified shoulder: Secondary | ICD-10-CM | POA: Diagnosis not present

## 2012-11-26 DIAGNOSIS — M719 Bursopathy, unspecified: Secondary | ICD-10-CM | POA: Diagnosis not present

## 2012-12-01 DIAGNOSIS — M67919 Unspecified disorder of synovium and tendon, unspecified shoulder: Secondary | ICD-10-CM | POA: Diagnosis not present

## 2012-12-01 DIAGNOSIS — M719 Bursopathy, unspecified: Secondary | ICD-10-CM | POA: Diagnosis not present

## 2012-12-01 DIAGNOSIS — M25669 Stiffness of unspecified knee, not elsewhere classified: Secondary | ICD-10-CM | POA: Diagnosis not present

## 2012-12-01 DIAGNOSIS — M25519 Pain in unspecified shoulder: Secondary | ICD-10-CM | POA: Diagnosis not present

## 2012-12-01 DIAGNOSIS — M7500 Adhesive capsulitis of unspecified shoulder: Secondary | ICD-10-CM | POA: Diagnosis not present

## 2012-12-03 DIAGNOSIS — M719 Bursopathy, unspecified: Secondary | ICD-10-CM | POA: Diagnosis not present

## 2012-12-03 DIAGNOSIS — M7500 Adhesive capsulitis of unspecified shoulder: Secondary | ICD-10-CM | POA: Diagnosis not present

## 2012-12-03 DIAGNOSIS — M67919 Unspecified disorder of synovium and tendon, unspecified shoulder: Secondary | ICD-10-CM | POA: Diagnosis not present

## 2012-12-03 DIAGNOSIS — M25519 Pain in unspecified shoulder: Secondary | ICD-10-CM | POA: Diagnosis not present

## 2012-12-03 DIAGNOSIS — M25669 Stiffness of unspecified knee, not elsewhere classified: Secondary | ICD-10-CM | POA: Diagnosis not present

## 2012-12-08 DIAGNOSIS — M7500 Adhesive capsulitis of unspecified shoulder: Secondary | ICD-10-CM | POA: Diagnosis not present

## 2012-12-08 DIAGNOSIS — M67919 Unspecified disorder of synovium and tendon, unspecified shoulder: Secondary | ICD-10-CM | POA: Diagnosis not present

## 2012-12-08 DIAGNOSIS — M25669 Stiffness of unspecified knee, not elsewhere classified: Secondary | ICD-10-CM | POA: Diagnosis not present

## 2012-12-08 DIAGNOSIS — M25519 Pain in unspecified shoulder: Secondary | ICD-10-CM | POA: Diagnosis not present

## 2012-12-08 DIAGNOSIS — M719 Bursopathy, unspecified: Secondary | ICD-10-CM | POA: Diagnosis not present

## 2012-12-09 DIAGNOSIS — M25519 Pain in unspecified shoulder: Secondary | ICD-10-CM | POA: Diagnosis not present

## 2012-12-09 DIAGNOSIS — M67919 Unspecified disorder of synovium and tendon, unspecified shoulder: Secondary | ICD-10-CM | POA: Diagnosis not present

## 2012-12-09 DIAGNOSIS — M7500 Adhesive capsulitis of unspecified shoulder: Secondary | ICD-10-CM | POA: Diagnosis not present

## 2012-12-09 DIAGNOSIS — M719 Bursopathy, unspecified: Secondary | ICD-10-CM | POA: Diagnosis not present

## 2012-12-09 DIAGNOSIS — M25669 Stiffness of unspecified knee, not elsewhere classified: Secondary | ICD-10-CM | POA: Diagnosis not present

## 2012-12-17 DIAGNOSIS — M25669 Stiffness of unspecified knee, not elsewhere classified: Secondary | ICD-10-CM | POA: Diagnosis not present

## 2012-12-17 DIAGNOSIS — M719 Bursopathy, unspecified: Secondary | ICD-10-CM | POA: Diagnosis not present

## 2012-12-17 DIAGNOSIS — M25519 Pain in unspecified shoulder: Secondary | ICD-10-CM | POA: Diagnosis not present

## 2012-12-17 DIAGNOSIS — M67919 Unspecified disorder of synovium and tendon, unspecified shoulder: Secondary | ICD-10-CM | POA: Diagnosis not present

## 2012-12-17 DIAGNOSIS — M7500 Adhesive capsulitis of unspecified shoulder: Secondary | ICD-10-CM | POA: Diagnosis not present

## 2012-12-20 ENCOUNTER — Telehealth: Payer: Self-pay

## 2012-12-20 DIAGNOSIS — M67919 Unspecified disorder of synovium and tendon, unspecified shoulder: Secondary | ICD-10-CM | POA: Diagnosis not present

## 2012-12-20 DIAGNOSIS — M7500 Adhesive capsulitis of unspecified shoulder: Secondary | ICD-10-CM | POA: Diagnosis not present

## 2012-12-20 DIAGNOSIS — M719 Bursopathy, unspecified: Secondary | ICD-10-CM | POA: Diagnosis not present

## 2012-12-20 DIAGNOSIS — M25519 Pain in unspecified shoulder: Secondary | ICD-10-CM | POA: Diagnosis not present

## 2012-12-20 DIAGNOSIS — M25669 Stiffness of unspecified knee, not elsewhere classified: Secondary | ICD-10-CM | POA: Diagnosis not present

## 2012-12-20 NOTE — Telephone Encounter (Signed)
[place] would like a prescription for Ambien (generic)- Would like to start it early to see how it would effect her.Please advise

## 2012-12-22 ENCOUNTER — Telehealth: Payer: Self-pay

## 2012-12-22 DIAGNOSIS — M719 Bursopathy, unspecified: Secondary | ICD-10-CM | POA: Diagnosis not present

## 2012-12-22 DIAGNOSIS — M25669 Stiffness of unspecified knee, not elsewhere classified: Secondary | ICD-10-CM | POA: Diagnosis not present

## 2012-12-22 DIAGNOSIS — M7500 Adhesive capsulitis of unspecified shoulder: Secondary | ICD-10-CM | POA: Diagnosis not present

## 2012-12-22 DIAGNOSIS — M67919 Unspecified disorder of synovium and tendon, unspecified shoulder: Secondary | ICD-10-CM | POA: Diagnosis not present

## 2012-12-22 DIAGNOSIS — M25519 Pain in unspecified shoulder: Secondary | ICD-10-CM | POA: Diagnosis not present

## 2012-12-22 MED ORDER — ZOLPIDEM TARTRATE 5 MG PO TABS: 5.0000 mg | ORAL_TABLET | Freq: Every evening | ORAL | Status: DC | PRN

## 2012-12-22 NOTE — Telephone Encounter (Signed)
Ambien 5 mg, dispense 20. No refills. One by mouth each bedtime when necessary

## 2012-12-22 NOTE — Telephone Encounter (Signed)
Husband states he spoke with Dr. Marina Goodell last night about some medication for his wife. He was supposed to call this am to let us know the name of the medication. Per Mr. Bucy pt was given a prescription for Ambien in the past, he did not know the dose. States his wife weighs 118lbs. Dr. Marina Goodell please advise.

## 2012-12-22 NOTE — Telephone Encounter (Signed)
Husband aware and script sent to the pharmacy.

## 2012-12-23 DIAGNOSIS — R609 Edema, unspecified: Secondary | ICD-10-CM | POA: Diagnosis not present

## 2012-12-23 DIAGNOSIS — Z472 Encounter for removal of internal fixation device: Secondary | ICD-10-CM | POA: Diagnosis not present

## 2012-12-23 DIAGNOSIS — M779 Enthesopathy, unspecified: Secondary | ICD-10-CM | POA: Diagnosis not present

## 2012-12-23 DIAGNOSIS — M2020 Hallux rigidus, unspecified foot: Secondary | ICD-10-CM | POA: Diagnosis not present

## 2012-12-23 NOTE — Telephone Encounter (Signed)
Left message on voicemail to call office. Saw in chart Dr. Marina Goodell ordered Ambien.

## 2013-01-10 DIAGNOSIS — M7500 Adhesive capsulitis of unspecified shoulder: Secondary | ICD-10-CM | POA: Diagnosis not present

## 2013-01-10 DIAGNOSIS — M67919 Unspecified disorder of synovium and tendon, unspecified shoulder: Secondary | ICD-10-CM | POA: Diagnosis not present

## 2013-01-10 DIAGNOSIS — M25519 Pain in unspecified shoulder: Secondary | ICD-10-CM | POA: Diagnosis not present

## 2013-01-10 DIAGNOSIS — M719 Bursopathy, unspecified: Secondary | ICD-10-CM | POA: Diagnosis not present

## 2013-01-10 DIAGNOSIS — M25669 Stiffness of unspecified knee, not elsewhere classified: Secondary | ICD-10-CM | POA: Diagnosis not present

## 2013-01-18 DIAGNOSIS — M7500 Adhesive capsulitis of unspecified shoulder: Secondary | ICD-10-CM | POA: Diagnosis not present

## 2013-01-18 DIAGNOSIS — M25669 Stiffness of unspecified knee, not elsewhere classified: Secondary | ICD-10-CM | POA: Diagnosis not present

## 2013-01-18 DIAGNOSIS — M25519 Pain in unspecified shoulder: Secondary | ICD-10-CM | POA: Diagnosis not present

## 2013-01-18 DIAGNOSIS — M67919 Unspecified disorder of synovium and tendon, unspecified shoulder: Secondary | ICD-10-CM | POA: Diagnosis not present

## 2013-01-18 DIAGNOSIS — M719 Bursopathy, unspecified: Secondary | ICD-10-CM | POA: Diagnosis not present

## 2013-01-24 DIAGNOSIS — M25669 Stiffness of unspecified knee, not elsewhere classified: Secondary | ICD-10-CM | POA: Diagnosis not present

## 2013-01-24 DIAGNOSIS — M7500 Adhesive capsulitis of unspecified shoulder: Secondary | ICD-10-CM | POA: Diagnosis not present

## 2013-01-24 DIAGNOSIS — M719 Bursopathy, unspecified: Secondary | ICD-10-CM | POA: Diagnosis not present

## 2013-01-24 DIAGNOSIS — M67919 Unspecified disorder of synovium and tendon, unspecified shoulder: Secondary | ICD-10-CM | POA: Diagnosis not present

## 2013-01-24 DIAGNOSIS — M25519 Pain in unspecified shoulder: Secondary | ICD-10-CM | POA: Diagnosis not present

## 2013-01-26 DIAGNOSIS — M25519 Pain in unspecified shoulder: Secondary | ICD-10-CM | POA: Diagnosis not present

## 2013-01-26 DIAGNOSIS — M25669 Stiffness of unspecified knee, not elsewhere classified: Secondary | ICD-10-CM | POA: Diagnosis not present

## 2013-01-26 DIAGNOSIS — M7500 Adhesive capsulitis of unspecified shoulder: Secondary | ICD-10-CM | POA: Diagnosis not present

## 2013-01-26 DIAGNOSIS — M719 Bursopathy, unspecified: Secondary | ICD-10-CM | POA: Diagnosis not present

## 2013-01-26 DIAGNOSIS — M67919 Unspecified disorder of synovium and tendon, unspecified shoulder: Secondary | ICD-10-CM | POA: Diagnosis not present

## 2013-01-28 DIAGNOSIS — M719 Bursopathy, unspecified: Secondary | ICD-10-CM | POA: Diagnosis not present

## 2013-01-28 DIAGNOSIS — M67919 Unspecified disorder of synovium and tendon, unspecified shoulder: Secondary | ICD-10-CM | POA: Diagnosis not present

## 2013-01-28 DIAGNOSIS — M25669 Stiffness of unspecified knee, not elsewhere classified: Secondary | ICD-10-CM | POA: Diagnosis not present

## 2013-01-28 DIAGNOSIS — M7500 Adhesive capsulitis of unspecified shoulder: Secondary | ICD-10-CM | POA: Diagnosis not present

## 2013-01-28 DIAGNOSIS — M25519 Pain in unspecified shoulder: Secondary | ICD-10-CM | POA: Diagnosis not present

## 2013-01-31 DIAGNOSIS — M719 Bursopathy, unspecified: Secondary | ICD-10-CM | POA: Diagnosis not present

## 2013-01-31 DIAGNOSIS — M67919 Unspecified disorder of synovium and tendon, unspecified shoulder: Secondary | ICD-10-CM | POA: Diagnosis not present

## 2013-01-31 DIAGNOSIS — M7500 Adhesive capsulitis of unspecified shoulder: Secondary | ICD-10-CM | POA: Diagnosis not present

## 2013-01-31 DIAGNOSIS — M25669 Stiffness of unspecified knee, not elsewhere classified: Secondary | ICD-10-CM | POA: Diagnosis not present

## 2013-01-31 DIAGNOSIS — M25519 Pain in unspecified shoulder: Secondary | ICD-10-CM | POA: Diagnosis not present

## 2013-02-02 DIAGNOSIS — M67919 Unspecified disorder of synovium and tendon, unspecified shoulder: Secondary | ICD-10-CM | POA: Diagnosis not present

## 2013-02-02 DIAGNOSIS — M7500 Adhesive capsulitis of unspecified shoulder: Secondary | ICD-10-CM | POA: Diagnosis not present

## 2013-02-02 DIAGNOSIS — M719 Bursopathy, unspecified: Secondary | ICD-10-CM | POA: Diagnosis not present

## 2013-02-02 DIAGNOSIS — M25519 Pain in unspecified shoulder: Secondary | ICD-10-CM | POA: Diagnosis not present

## 2013-02-02 DIAGNOSIS — M25669 Stiffness of unspecified knee, not elsewhere classified: Secondary | ICD-10-CM | POA: Diagnosis not present

## 2013-02-07 DIAGNOSIS — M7500 Adhesive capsulitis of unspecified shoulder: Secondary | ICD-10-CM | POA: Diagnosis not present

## 2013-02-07 DIAGNOSIS — M25519 Pain in unspecified shoulder: Secondary | ICD-10-CM | POA: Diagnosis not present

## 2013-02-07 DIAGNOSIS — M25669 Stiffness of unspecified knee, not elsewhere classified: Secondary | ICD-10-CM | POA: Diagnosis not present

## 2013-02-07 DIAGNOSIS — M719 Bursopathy, unspecified: Secondary | ICD-10-CM | POA: Diagnosis not present

## 2013-02-07 DIAGNOSIS — M67919 Unspecified disorder of synovium and tendon, unspecified shoulder: Secondary | ICD-10-CM | POA: Diagnosis not present

## 2013-02-09 DIAGNOSIS — M719 Bursopathy, unspecified: Secondary | ICD-10-CM | POA: Diagnosis not present

## 2013-02-09 DIAGNOSIS — M67919 Unspecified disorder of synovium and tendon, unspecified shoulder: Secondary | ICD-10-CM | POA: Diagnosis not present

## 2013-02-09 DIAGNOSIS — M7500 Adhesive capsulitis of unspecified shoulder: Secondary | ICD-10-CM | POA: Diagnosis not present

## 2013-02-09 DIAGNOSIS — M25519 Pain in unspecified shoulder: Secondary | ICD-10-CM | POA: Diagnosis not present

## 2013-02-09 DIAGNOSIS — M25669 Stiffness of unspecified knee, not elsewhere classified: Secondary | ICD-10-CM | POA: Diagnosis not present

## 2013-02-14 DIAGNOSIS — M25519 Pain in unspecified shoulder: Secondary | ICD-10-CM | POA: Diagnosis not present

## 2013-06-02 ENCOUNTER — Ambulatory Visit (INDEPENDENT_AMBULATORY_CARE_PROVIDER_SITE_OTHER): Payer: Medicare Other | Admitting: Internal Medicine

## 2013-06-02 ENCOUNTER — Encounter: Payer: Self-pay | Admitting: Internal Medicine

## 2013-06-02 VITALS — BP 114/80 | HR 72 | Temp 97.9°F | Ht 66.0 in | Wt 117.0 lb

## 2013-06-02 DIAGNOSIS — Z23 Encounter for immunization: Secondary | ICD-10-CM

## 2013-06-02 DIAGNOSIS — F411 Generalized anxiety disorder: Secondary | ICD-10-CM | POA: Diagnosis not present

## 2013-06-02 MED ORDER — METOPROLOL TARTRATE 25 MG PO TABS: ORAL_TABLET | ORAL | Status: DC

## 2013-06-02 MED ORDER — ALPRAZOLAM 0.5 MG PO TABS: 5.0000 mg | ORAL_TABLET | Freq: Every evening | ORAL | Status: DC | PRN

## 2013-06-02 MED ORDER — SERTRALINE HCL 50 MG PO TABS: 50.0000 mg | ORAL_TABLET | Freq: Every day | ORAL | Status: DC

## 2013-06-02 NOTE — Progress Notes (Signed)
Subjective:    Patient ID: [name], female    DOB: May 15, 1946, 67 y.o.   MRN: [id number]  HPI Pre-visit discussion using our clinic review tool. No additional management support is needed unless otherwise documented below in the visit note.  67 year old patient who is seen today for her annual followup. She has history of mild anxiety and takes alprazolam infrequently. She also is on sertraline once daily and has done quite well on this regimen. She enjoys excellent health she also takes metoprolol when necessary palpitations which have been stable. No new concerns or complaints  Patient had left rotator cuff surgery performed in April by Dr. Gemma Payor Readings from Last 3 Encounters:  06/02/13 117 lb (53.071 kg)  05/25/12 117 lb (53.071 kg)  06/30/11 112 lb (50.803 kg)    Past Medical History  Diagnosis Date  . HERPES ZOSTER 10/12/2008  . ANXIETY 10/12/2008  . COPD 03/26/2009  . Cancer     breast  . Palpitations   . Lumbago   . Acute upper respiratory infections of unspecified site   . Urinary tract infection, site not specified   . Syncopal episodes 8/10    "presyncopal" prbly vasovagal-MI ruled out; EKG normal; telemetry with PVC's/PAC's. Stress echo-no evidence for ischemia. EF 65-70%. No valvular abnormalities, no pulm. HTN    History   Social History  . Marital Status: Married    Spouse Name: N/A    Number of Children: 2  . Years of Education: N/A   Occupational History  . Part-time Manufacturing systems engineer    Social History Main Topics  . Smoking status: Former Games developer  . Smokeless tobacco: Not on file  . Alcohol Use: 0.0 oz/week    7-14 Glasses of wine per week     Comment: wodka tonic every night, sometimes 2.   . Drug Use: No  . Sexual Activity: Not on file   Other Topics Concern  . Not on file   Social History Narrative  . No narrative on file    Past Surgical History  Procedure Laterality Date  . Oophorectomy      Family History  Problem  Relation Age of Onset  . Dementia Mother   . Parkinsonism Father   . Diabetes Brother     Allergies  Allergen Reactions  . Codeine Phosphate   . Ephedrine Hcl   . Penicillins     REACTION: per allergy test    No current outpatient prescriptions on file prior to visit.   No current facility-administered medications on file prior to visit.    BP 114/80  Pulse 72  Temp(Src) 97.9 F (36.6 C) (Oral)  Ht 5\' 6"  (1.676 m)  Wt 117 lb (53.071 kg)  BMI 18.89 kg/m2     Review of Systems  Constitutional: Negative.   HENT: Negative for congestion, dental problem, hearing loss, rhinorrhea, sinus pressure, sore throat and tinnitus.   Eyes: Negative for pain, discharge and visual disturbance.  Respiratory: Negative for cough and shortness of breath.   Cardiovascular: Negative for chest pain, palpitations and leg swelling.  Gastrointestinal: Negative for nausea, vomiting, abdominal pain, diarrhea, constipation, blood in stool and abdominal distention.  Genitourinary: Negative for dysuria, urgency, frequency, hematuria, flank pain, vaginal bleeding, vaginal discharge, difficulty urinating, vaginal pain and pelvic pain.  Musculoskeletal: Negative for arthralgias, gait problem and joint swelling.  Skin: Negative for rash.  Neurological: Negative for dizziness, syncope, speech difficulty, weakness, numbness and headaches.  Hematological: Negative for adenopathy.  Psychiatric/Behavioral:  Negative for behavioral problems, dysphoric mood and agitation. The patient is not nervous/anxious.        Objective:   Physical Exam  Constitutional: She is oriented to person, place, and time. She appears well-developed and well-nourished.  HENT:  Head: Normocephalic.  Right Ear: External ear normal.  Left Ear: External ear normal.  Mouth/Throat: Oropharynx is clear and moist.  Eyes: Conjunctivae and EOM are normal. Pupils are equal, round, and reactive to light.  Neck: Normal range of motion. Neck  supple. No thyromegaly present.  Cardiovascular: Normal rate, regular rhythm and normal heart sounds.   Absent left dorsalis pedis pulse  Pulmonary/Chest: Effort normal and breath sounds normal.  Abdominal: Soft. Bowel sounds are normal. She exhibits no mass. There is no tenderness.  Musculoskeletal: Normal range of motion.  Lymphadenopathy:    She has no cervical adenopathy.  Neurological: She is alert and oriented to person, place, and time.  Skin: Skin is warm and dry. No rash noted.  Psychiatric: She has a normal mood and affect. Her behavior is normal.          Assessment & Plan:   Mild anxiety disorder stable History palpitations  We'll schedule for a complete exam at her convenience

## 2013-06-02 NOTE — Progress Notes (Signed)
Pre visit review using our clinic review tool, if applicable. No additional management support is needed unless otherwise documented below in the visit note. 

## 2013-06-02 NOTE — Patient Instructions (Addendum)
It is important that you exercise regularly, at least 20 minutes 3 to 4 times per week.  If you develop chest pain or shortness of breath seek  medical attention.  Return in one year for follow-up and complete physical exam

## 2013-06-06 ENCOUNTER — Other Ambulatory Visit: Payer: Self-pay | Admitting: *Deleted

## 2013-06-06 MED ORDER — ALPRAZOLAM 0.5 MG PO TABS: 0.5000 mg | ORAL_TABLET | Freq: Every evening | ORAL | Status: DC | PRN

## 2013-07-19 DIAGNOSIS — Z1231 Encounter for screening mammogram for malignant neoplasm of breast: Secondary | ICD-10-CM | POA: Diagnosis not present

## 2013-07-19 LAB — HM MAMMOGRAPHY

## 2013-07-20 ENCOUNTER — Encounter: Payer: Self-pay | Admitting: Internal Medicine

## 2013-09-06 DIAGNOSIS — H6690 Otitis media, unspecified, unspecified ear: Secondary | ICD-10-CM | POA: Diagnosis not present

## 2013-09-06 DIAGNOSIS — H9209 Otalgia, unspecified ear: Secondary | ICD-10-CM | POA: Diagnosis not present

## 2013-09-12 ENCOUNTER — Ambulatory Visit (INDEPENDENT_AMBULATORY_CARE_PROVIDER_SITE_OTHER): Payer: Medicare Other | Admitting: Internal Medicine

## 2013-09-12 ENCOUNTER — Encounter: Payer: Self-pay | Admitting: Internal Medicine

## 2013-09-12 VITALS — BP 144/80 | HR 63 | Temp 98.7°F | Ht 66.0 in | Wt 119.0 lb

## 2013-09-12 DIAGNOSIS — H6590 Unspecified nonsuppurative otitis media, unspecified ear: Secondary | ICD-10-CM | POA: Diagnosis not present

## 2013-09-12 DIAGNOSIS — H6591 Unspecified nonsuppurative otitis media, right ear: Secondary | ICD-10-CM

## 2013-09-12 DIAGNOSIS — R439 Unspecified disturbances of smell and taste: Secondary | ICD-10-CM | POA: Diagnosis not present

## 2013-09-12 DIAGNOSIS — R432 Parageusia: Secondary | ICD-10-CM

## 2013-09-12 MED ORDER — AMOXICILLIN-POT CLAVULANATE 875-125 MG PO TABS: 1.0000 | ORAL_TABLET | Freq: Two times a day (BID) | ORAL | Status: DC

## 2013-09-12 NOTE — Progress Notes (Signed)
Chief Complaint  Patient presents with  . Otalgia    Rt ear.  Was seen at the [facility] last Tuesday and was given a presription for amoxicillin and ear drops.    HPI: Patient comes in today for SDA for  new problem evaluation. PCP  Dr Raliegh Ip  1 week  Ago  Weather Closed here and was seen  [facility] Ear infection right. 1000mg  per day  50 through rx and feels like cork in it  And full. And lethargic.   Lots of PNdrainge and some sneezing .  Dec taste  .  Dec hearing.  No more pain but wonders if needs to do other rx .  No se of med   orig feb 12 flew to [place] and noted ear pressure was  Hurting  With drippy nose . Took otc and when coming back pressure pain both sides of ears hurt but no pop or acute hearing loss or dizziness . Came back feb 15th or so .  Then developed severe right ear pain and dec hearing as above  ROS: See pertinent positives and negatives per HPI. 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 HERPES ZOSTER 10/12/2008  . ANXIETY 10/12/2008  . COPD 03/26/2009  . Cancer     breast  . Palpitations   . Lumbago   . Acute upper respiratory infections of unspecified site   . Urinary tract infection, site not specified   . Syncopal episodes 8/10    "presyncopal" prbly vasovagal-MI ruled out; EKG normal; telemetry with PVC's/PAC's. Stress echo-no evidence for ischemia. EF 65-70%. No valvular abnormalities, no pulm. HTN    Family History  Problem Relation Age of Onset  . Dementia Mother   . Parkinsonism Father   . Diabetes Brother     History   Social History  . Marital Status: Married    Spouse Name: N/A    Number of Children: 2  . Years of Education: N/A   Occupational History  . Part-time Print production planner    Social History Main Topics  . Smoking status: Former Research scientist (life sciences)  . Smokeless tobacco: None  . Alcohol Use: 0.0 oz/week    7-14 Glasses of wine per week     Comment:  wodka tonic every night, sometimes 2.   . Drug Use: No  . Sexual Activity: None   Other Topics Concern  . None   Social History Narrative  . None    Outpatient Encounter Prescriptions as of 09/12/2013  Medication Sig  . metoprolol tartrate (LOPRESSOR) 25 MG tablet 1/2 tablet bid  . sertraline (ZOLOFT) 50 MG tablet Take 1 tablet (50 mg total) by mouth daily.  Marland Kitchen ALPRAZolam (XANAX) 0.5 MG tablet Take 1 tablet (0.5 mg total) by mouth at bedtime as needed for sleep.  Marland Kitchen amoxicillin-clavulanate (AUGMENTIN) 875-125 MG per tablet Take 1 tablet by mouth every 12 (twelve) hours.    EXAM:  BP 144/80  Pulse 63  Temp(Src) 98.7 F (37.1 C) (Oral)  Ht 5\' 6"  (1.676 m)  Wt 119 lb (53.978 kg)  BMI 19.22 kg/m2  SpO2 99%  Body mass index is 19.22 kg/(m^2).  GENERAL: vitals reviewed and listed above, alert, oriented, appears well hydrated and in no acute distress minimal congestion  Left eac and tm nl right eac nl tm slight clear yellow fluid inferiorly ;intact . Op clear sinus neg tender  midl  congesion  Chest:  Clear to A without wheezes rales or rhonchi CV:  S1-S2 no gallops or murmurs peripheral perfusion is normal Gait nl neuro grossly non focal  PSYCH: pleasant and cooperative, no obvious depression or anxiety  ASSESSMENT AND PLAN:  Discussed the following assessment and plan:  Right otitis media with effusion - improved by hx no more pain  Altered taste - with uro and OM  poss sinusitis based on context  under rx  Expect dec hearing to stay for up to another 1-3 weeks.  No obv inner ear sx from acute barotrauma consdier broader spectrum if sinus sx not resolving or relapsing ear sx.  -Patient advised to return or notify health care team  if symptoms worsen or persist or new concerns arise.  Patient Instructions  Ear infection  Improving  : fluid can remain for a while and hearing may take  up to 3-4 weeks to be back   normal .  Rx for augmentin if needed .  Contact us  If   persistent or progressive  After then     Standley Brooking. Panosh M.D.  Pre visit review using our clinic review tool, if applicable. No additional management support is needed unless otherwise documented below in the visit note.

## 2013-09-12 NOTE — Patient Instructions (Signed)
Ear infection  Improving  : fluid can remain for a while and hearing may take  up to 3-4 weeks to be back   normal .  Rx for augmentin if needed .  Contact us  If  persistent or progressive  After then

## 2014-06-12 ENCOUNTER — Encounter: Payer: Self-pay | Admitting: Internal Medicine

## 2014-06-12 ENCOUNTER — Ambulatory Visit (INDEPENDENT_AMBULATORY_CARE_PROVIDER_SITE_OTHER): Payer: Medicare Other | Admitting: Internal Medicine

## 2014-06-12 VITALS — BP 120/64 | HR 62 | Temp 98.5°F | Resp 18 | Ht 66.0 in | Wt 121.0 lb

## 2014-06-12 DIAGNOSIS — Z23 Encounter for immunization: Secondary | ICD-10-CM | POA: Diagnosis not present

## 2014-06-12 DIAGNOSIS — F411 Generalized anxiety disorder: Secondary | ICD-10-CM | POA: Diagnosis not present

## 2014-06-12 DIAGNOSIS — J449 Chronic obstructive pulmonary disease, unspecified: Secondary | ICD-10-CM | POA: Diagnosis not present

## 2014-06-12 MED ORDER — METOPROLOL TARTRATE 25 MG PO TABS: ORAL_TABLET | ORAL | Status: DC

## 2014-06-12 MED ORDER — SERTRALINE HCL 50 MG PO TABS: 50.0000 mg | ORAL_TABLET | Freq: Every day | ORAL | Status: DC

## 2014-06-12 MED ORDER — ALPRAZOLAM 0.5 MG PO TABS: 0.5000 mg | ORAL_TABLET | Freq: Every evening | ORAL | Status: DC | PRN

## 2014-06-12 NOTE — Progress Notes (Signed)
Subjective:    Patient ID: [name], female    DOB: 1946/02/25, 68 y.o.   MRN: [id number]  HPI  Wt Readings from Last 3 Encounters:  06/12/14 121 lb (54.885 kg)  09/12/13 119 lb (53.978 kg)  06/02/13 117 lb (53.58 kg)   68 year old patient who is seen today for follow-up.  She is seen now very infrequently and does have a history of mild anxiety disorder and COPD.  No real concerns or complaints. She still works full-time as a Pharmacist, hospital  Past Medical History  Diagnosis Date  . HERPES ZOSTER 10/12/2008  . ANXIETY 10/12/2008  . COPD 03/26/2009  . Cancer     breast  . Palpitations   . Lumbago   . Acute upper respiratory infections of unspecified site   . Urinary tract infection, site not specified   . Syncopal episodes 8/10    "presyncopal" prbly vasovagal-MI ruled out; EKG normal; telemetry with PVC's/PAC's. Stress echo-no evidence for ischemia. EF 65-70%. No valvular abnormalities, no pulm. HTN    History   Social History  . Marital Status: Married    Spouse Name: N/A    Number of Children: 2  . Years of Education: N/A   Occupational History  . Part-time Print production planner    Social History Main Topics  . Smoking status: Former Research scientist (life sciences)  . Smokeless tobacco: Not on file  . Alcohol Use: 0.0 oz/week    7-14 Glasses of wine per week     Comment: wodka tonic every night, sometimes 2.   . Drug Use: No  . Sexual Activity: Not on file   Other Topics Concern  . Not on file   Social History Narrative    Past Surgical History  Procedure Laterality Date  . Oophorectomy      Family History  Problem Relation Age of Onset  . Dementia Mother   . Parkinsonism Father   . Diabetes Brother     Allergies  Allergen Reactions  . Codeine Phosphate   . Ephedrine Hcl   . Penicillins     REACTION: per allergy test    No current outpatient prescriptions on file prior to visit.   No current facility-administered medications on file prior to visit.    BP 120/64 mmHg   Pulse 62  Temp(Src) 98.5 F (36.9 C) (Oral)  Resp 18  Ht 5\' 6"  (1.676 m)  Wt 121 lb (54.885 kg)  BMI 19.54 kg/m2  SpO2 98%      Review of Systems  Constitutional: Negative.   HENT: Negative for congestion, dental problem, hearing loss, rhinorrhea, sinus pressure, sore throat and tinnitus.   Eyes: Negative for pain, discharge and visual disturbance.  Respiratory: Negative for cough and shortness of breath.   Cardiovascular: Negative for chest pain, palpitations and leg swelling.  Gastrointestinal: Negative for nausea, vomiting, abdominal pain, diarrhea, constipation, blood in stool and abdominal distention.  Genitourinary: Negative for dysuria, urgency, frequency, hematuria, flank pain, vaginal bleeding, vaginal discharge, difficulty urinating, vaginal pain and pelvic pain.  Musculoskeletal: Negative for joint swelling, arthralgias and gait problem.  Skin: Negative for rash.  Neurological: Negative for dizziness, syncope, speech difficulty, weakness, numbness and headaches.  Hematological: Negative for adenopathy.  Psychiatric/Behavioral: Negative for behavioral problems, dysphoric mood and agitation. The patient is not nervous/anxious.        Objective:   Physical Exam  Constitutional: She is oriented to person, place, and time. She appears well-developed and well-nourished.  HENT:  Head: Normocephalic.  Right Ear: External  ear normal.  Left Ear: External ear normal.  Mouth/Throat: Oropharynx is clear and moist.  Eyes: Conjunctivae and EOM are normal. Pupils are equal, round, and reactive to light.  Neck: Normal range of motion. Neck supple. No thyromegaly present.  Cardiovascular: Normal rate, regular rhythm, normal heart sounds and intact distal pulses.   Pulmonary/Chest: Effort normal and breath sounds normal.  Abdominal: Soft. Bowel sounds are normal. She exhibits no mass. There is no tenderness.  Musculoskeletal: Normal range of motion.  Lymphadenopathy:    She has  no cervical adenopathy.  Neurological: She is alert and oriented to person, place, and time.  Skin: Skin is warm and dry. No rash noted.  Psychiatric: She has a normal mood and affect. Her behavior is normal.          Assessment & Plan:   Anxiety disorder.  Continue present regimen History mild COPD.  Asymptomatic  Schedule CPX Medications updated

## 2014-06-12 NOTE — Progress Notes (Signed)
Pre visit review using our clinic review tool, if applicable. No additional management support is needed unless otherwise documented below in the visit note. 

## 2014-06-12 NOTE — Patient Instructions (Signed)
It is important that you exercise regularly, at least 20 minutes 3 to 4 times per week.  If you develop chest pain or shortness of breath seek  medical attention.  Return in 6 months for follow-up

## 2014-08-07 DIAGNOSIS — J209 Acute bronchitis, unspecified: Secondary | ICD-10-CM | POA: Diagnosis not present

## 2014-08-07 DIAGNOSIS — J40 Bronchitis, not specified as acute or chronic: Secondary | ICD-10-CM | POA: Diagnosis not present

## 2014-08-07 DIAGNOSIS — J300 Vasomotor rhinitis: Secondary | ICD-10-CM | POA: Diagnosis not present

## 2014-08-07 DIAGNOSIS — R05 Cough: Secondary | ICD-10-CM | POA: Diagnosis not present

## 2014-09-06 DIAGNOSIS — Z1231 Encounter for screening mammogram for malignant neoplasm of breast: Secondary | ICD-10-CM | POA: Diagnosis not present

## 2014-09-06 DIAGNOSIS — Z853 Personal history of malignant neoplasm of breast: Secondary | ICD-10-CM | POA: Diagnosis not present

## 2014-09-06 LAB — HM MAMMOGRAPHY

## 2014-09-07 ENCOUNTER — Encounter: Payer: Self-pay | Admitting: *Deleted

## 2015-06-07 ENCOUNTER — Telehealth: Payer: Self-pay | Admitting: *Deleted

## 2015-06-07 ENCOUNTER — Ambulatory Visit (INDEPENDENT_AMBULATORY_CARE_PROVIDER_SITE_OTHER): Payer: Medicare Other | Admitting: *Deleted

## 2015-06-07 DIAGNOSIS — Z23 Encounter for immunization: Secondary | ICD-10-CM

## 2015-06-07 NOTE — Telephone Encounter (Signed)
Patient came into office for flu vaccine. Patient also requested:   (1) Refill on Metoprolol and Xanax (2) Appointment for physical -- has desire to get general blood work and urine tested  (3) Speak with Dr. Raliegh Ip about changing medication from Xanax to Ambien. Patient heard Xanax was related to early dementia. Also, patient states she only uses it because has a hard time staying awake once asleep so may not be using the Xanax appropriately.   I told patient his nurse would get back to her about scheduling appointment and refills. Patient is aware Dr. Raliegh Ip is out of office until 28th of this month.

## 2015-06-08 MED ORDER — METOPROLOL TARTRATE 25 MG PO TABS: ORAL_TABLET | ORAL | Status: DC

## 2015-06-08 NOTE — Telephone Encounter (Signed)
Spoke to [place], asked her to call pt and schedule physical and let her know Rx sent to pharmacy. Constance Holster verbalized understanding.

## 2015-06-08 NOTE — Telephone Encounter (Signed)
Rx for Metoprolol was sent to pharmacy.

## 2015-06-18 ENCOUNTER — Telehealth: Payer: Self-pay | Admitting: Internal Medicine

## 2015-06-18 MED ORDER — ALPRAZOLAM 0.5 MG PO TABS: 0.5000 mg | ORAL_TABLET | Freq: Every evening | ORAL | Status: DC | PRN

## 2015-06-18 MED ORDER — SERTRALINE HCL 50 MG PO TABS: 50.0000 mg | ORAL_TABLET | Freq: Every day | ORAL | Status: DC

## 2015-06-18 NOTE — Telephone Encounter (Signed)
Patient came requesting RX refill on ALPRAZolam (XANAX) 0.5 MG tablet and sertraline (ZOLOFT) 50 MG tablet.  [facility]: [facility]  Patient appointment for a CPX has been schedule.

## 2015-06-18 NOTE — Telephone Encounter (Signed)
Left message on voicemail  Rx's sent to pharmacy.

## 2015-08-01 DIAGNOSIS — J300 Vasomotor rhinitis: Secondary | ICD-10-CM | POA: Diagnosis not present

## 2015-08-01 DIAGNOSIS — H9203 Otalgia, bilateral: Secondary | ICD-10-CM | POA: Diagnosis not present

## 2015-08-01 DIAGNOSIS — R05 Cough: Secondary | ICD-10-CM | POA: Diagnosis not present

## 2015-08-28 DIAGNOSIS — H61893 Other specified disorders of external ear, bilateral: Secondary | ICD-10-CM | POA: Diagnosis not present

## 2015-08-28 DIAGNOSIS — L299 Pruritus, unspecified: Secondary | ICD-10-CM | POA: Diagnosis not present

## 2015-08-28 DIAGNOSIS — H9193 Unspecified hearing loss, bilateral: Secondary | ICD-10-CM | POA: Diagnosis not present

## 2015-09-03 ENCOUNTER — Ambulatory Visit (INDEPENDENT_AMBULATORY_CARE_PROVIDER_SITE_OTHER): Payer: Medicare Other | Admitting: Internal Medicine

## 2015-09-03 ENCOUNTER — Encounter: Payer: Self-pay | Admitting: Internal Medicine

## 2015-09-03 VITALS — BP 122/80 | HR 71 | Temp 98.5°F | Resp 18 | Ht 65.0 in | Wt 127.0 lb

## 2015-09-03 DIAGNOSIS — Z0000 Encounter for general adult medical examination without abnormal findings: Secondary | ICD-10-CM | POA: Diagnosis not present

## 2015-09-03 DIAGNOSIS — E785 Hyperlipidemia, unspecified: Secondary | ICD-10-CM | POA: Diagnosis not present

## 2015-09-03 DIAGNOSIS — R635 Abnormal weight gain: Secondary | ICD-10-CM

## 2015-09-03 DIAGNOSIS — J449 Chronic obstructive pulmonary disease, unspecified: Secondary | ICD-10-CM

## 2015-09-03 LAB — LIPID PANEL
CHOL/HDL RATIO: 3
Cholesterol: 252 mg/dL — ABNORMAL HIGH (ref 0–200)
HDL: 76.1 mg/dL (ref >39.00)
LDL Cholesterol: 164 mg/dL — ABNORMAL HIGH (ref 0–99)
NonHDL: 176.05
TRIGLYCERIDES: 61 mg/dL (ref 0.0–149.0)
VLDL: 12.2 mg/dL (ref 0.0–40.0)

## 2015-09-03 LAB — COMPREHENSIVE METABOLIC PANEL
ALBUMIN: 4.5 g/dL (ref 3.5–5.2)
ALK PHOS: 89 U/L (ref 39–117)
ALT: 15 U/L (ref 0–35)
AST: 27 U/L (ref 0–37)
BILIRUBIN TOTAL: 0.7 mg/dL (ref 0.2–1.2)
BUN: 14 mg/dL (ref 6–23)
CALCIUM: 9.4 mg/dL (ref 8.4–10.5)
CO2: 28 meq/L (ref 19–32)
Chloride: 104 mEq/L (ref 96–112)
Creatinine, Ser: 0.72 mg/dL (ref 0.40–1.20)
GFR: 85.09 mL/min (ref >60.00)
Glucose, Bld: 91 mg/dL (ref 70–99)
Potassium: 4.6 mEq/L (ref 3.5–5.1)
Sodium: 140 mEq/L (ref 135–145)
Total Protein: 7.1 g/dL (ref 6.0–8.3)

## 2015-09-03 LAB — CBC WITH DIFFERENTIAL/PLATELET
Basophils Absolute: 0 10*3/uL (ref 0.0–0.1)
Basophils Relative: 1.1 % (ref 0.0–3.0)
Eosinophils Absolute: 0.2 10*3/uL (ref 0.0–0.7)
Eosinophils Relative: 5 % (ref 0.0–5.0)
HCT: 39.4 % (ref 36.0–46.0)
HEMOGLOBIN: 13.2 g/dL (ref 12.0–15.0)
LYMPHS PCT: 24.8 % (ref 12.0–46.0)
Lymphs Abs: 1.1 10*3/uL (ref 0.7–4.0)
MCHC: 33.6 g/dL (ref 30.0–36.0)
MCV: 96.8 fl (ref 78.0–100.0)
MONO ABS: 0.4 10*3/uL (ref 0.1–1.0)
Monocytes Relative: 8.7 % (ref 3.0–12.0)
NEUTROS PCT: 60.4 % (ref 43.0–77.0)
Neutro Abs: 2.6 10*3/uL (ref 1.4–7.7)
Platelets: 231 10*3/uL (ref 150.0–400.0)
RBC: 4.07 Mil/uL (ref 3.87–5.11)
RDW: 13.4 % (ref 11.5–15.5)
WBC: 4.4 10*3/uL (ref 4.0–10.5)

## 2015-09-03 LAB — POCT URINALYSIS DIPSTICK
Bilirubin, UA: NEGATIVE
GLUCOSE UA: NEGATIVE
Ketones, UA: NEGATIVE
Leukocytes, UA: NEGATIVE
NITRITE UA: NEGATIVE
PH UA: 6
PROTEIN UA: NEGATIVE
Spec Grav, UA: 1.02
UROBILINOGEN UA: 1

## 2015-09-03 LAB — TSH: TSH: 2.2 u[IU]/mL (ref 0.35–4.50)

## 2015-09-03 MED ORDER — METOPROLOL TARTRATE 25 MG PO TABS: ORAL_TABLET | ORAL | Status: DC

## 2015-09-03 MED ORDER — SERTRALINE HCL 50 MG PO TABS: 50.0000 mg | ORAL_TABLET | Freq: Every day | ORAL | Status: DC

## 2015-09-03 MED ORDER — ALPRAZOLAM 0.5 MG PO TABS: 0.5000 mg | ORAL_TABLET | Freq: Every evening | ORAL | Status: DC | PRN

## 2015-09-03 NOTE — Patient Instructions (Signed)
Schedule your mammogram.  Schedule your colonoscopy to help detect colon cancer.    It is important that you exercise regularly, at least 20 minutes 3 to 4 times per week.  If you develop chest pain or shortness of breath seek  medical attention.  Take a calcium supplement, plus [phone] units of vitamin D  Return in one year for follow-up   Menopause is a normal process in which your reproductive ability comes to an end. This process happens gradually over a span of months to years, usually between the ages of 25 and 8. Menopause is complete when you have missed 12 consecutive menstrual periods. It is important to talk with your health care provider about some of the most common conditions that affect postmenopausal women, such as heart disease, cancer, and bone loss (osteoporosis). Adopting a healthy lifestyle and getting preventive care can help to promote your health and wellness. Those actions can also lower your chances of developing some of these common conditions. WHAT SHOULD I KNOW ABOUT MENOPAUSE? During menopause, you may experience a number of symptoms, such as:  Moderate-to-severe hot flashes.  Night sweats.  Decrease in sex drive.  Mood swings.  Headaches.  Tiredness.  Irritability.  Memory problems.  Insomnia. Choosing to treat or not to treat menopausal changes is an individual decision that you make with your health care provider. WHAT SHOULD I KNOW ABOUT HORMONE REPLACEMENT THERAPY AND SUPPLEMENTS? Hormone therapy products are effective for treating symptoms that are associated with menopause, such as hot flashes and night sweats. Hormone replacement carries certain risks, especially as you become older. If you are thinking about using estrogen or estrogen with progestin treatments, discuss the benefits and risks with your health care provider. WHAT SHOULD I KNOW ABOUT HEART DISEASE AND STROKE? Heart disease, heart attack, and stroke become more likely as you  age. This may be due, in part, to the hormonal changes that your body experiences during menopause. These can affect how your body processes dietary fats, triglycerides, and cholesterol. Heart attack and stroke are both medical emergencies. There are many things that you can do to help prevent heart disease and stroke:  Have your blood pressure checked at least every 1-2 years. High blood pressure causes heart disease and increases the risk of stroke.  If you are 76-62 years old, ask your health care provider if you should take aspirin to prevent a heart attack or a stroke.  Do not use any tobacco products, including cigarettes, chewing tobacco, or electronic cigarettes. If you need help quitting, ask your health care provider.  It is important to eat a healthy diet and maintain a healthy weight.  Be sure to include plenty of vegetables, fruits, low-fat dairy products, and lean protein.  Avoid eating foods that are high in solid fats, added sugars, or salt (sodium).  Get regular exercise. This is one of the most important things that you can do for your health.  Try to exercise for at least 150 minutes each week. The type of exercise that you do should increase your heart rate and make you sweat. This is known as moderate-intensity exercise.  Try to do strengthening exercises at least twice each week. Do these in addition to the moderate-intensity exercise.  Know your numbers.Ask your health care provider to check your cholesterol and your blood glucose. Continue to have your blood tested as directed by your health care provider. WHAT SHOULD I KNOW ABOUT CANCER SCREENING? There are several types of cancer. Take the  following steps to reduce your risk and to catch any cancer development as early as possible. Breast Cancer  Practice breast self-awareness.  This means understanding how your breasts normally appear and feel.  It also means doing regular breast self-exams. Let your health  care provider know about any changes, no matter how small.  If you are 13 or older, have a clinician do a breast exam (clinical breast exam or CBE) every year. Depending on your age, family history, and medical history, it may be recommended that you also have a yearly breast X-ray (mammogram).  If you have a family history of breast cancer, talk with your health care provider about genetic screening.  If you are at high risk for breast cancer, talk with your health care provider about having an MRI and a mammogram every year.  Breast cancer (BRCA) gene test is recommended for women who have family members with BRCA-related cancers. Results of the assessment will determine the need for genetic counseling and BRCA1 and for BRCA2 testing. BRCA-related cancers include these types:  Breast. This occurs in males or females.  Ovarian.  Tubal. This may also be called fallopian tube cancer.  Cancer of the abdominal or pelvic lining (peritoneal cancer).  Prostate.  Pancreatic. Cervical, Uterine, and Ovarian Cancer Your health care provider may recommend that you be screened regularly for cancer of the pelvic organs. These include your ovaries, uterus, and vagina. This screening involves a pelvic exam, which includes checking for microscopic changes to the surface of your cervix (Pap test).  For women ages 21-65, health care providers may recommend a pelvic exam and a Pap test every three years. For women ages 55-65, they may recommend the Pap test and pelvic exam, combined with testing for human papilloma virus (HPV), every five years. Some types of HPV increase your risk of cervical cancer. Testing for HPV may also be done on women of any age who have unclear Pap test results.  Other health care providers may not recommend any screening for nonpregnant women who are considered low risk for pelvic cancer and have no symptoms. Ask your health care provider if a screening pelvic exam is right for  you.  If you have had past treatment for cervical cancer or a condition that could lead to cancer, you need Pap tests and screening for cancer for at least 20 years after your treatment. If Pap tests have been discontinued for you, your risk factors (such as having a new sexual partner) need to be reassessed to determine if you should start having screenings again. Some women have medical problems that increase the chance of getting cervical cancer. In these cases, your health care provider may recommend that you have screening and Pap tests more often.  If you have a family history of uterine cancer or ovarian cancer, talk with your health care provider about genetic screening.  If you have vaginal bleeding after reaching menopause, tell your health care provider.  There are currently no reliable tests available to screen for ovarian cancer. Lung Cancer Lung cancer screening is recommended for adults 35-71 years old who are at high risk for lung cancer because of a history of smoking. A yearly low-dose CT scan of the lungs is recommended if you:  Currently smoke.  Have a history of at least 30 pack-years of smoking and you currently smoke or have quit within the past 15 years. A pack-year is smoking an average of one pack of cigarettes per day for one  year. Yearly screening should:  Continue until it has been 15 years since you quit.  Stop if you develop a health problem that would prevent you from having lung cancer treatment. Colorectal Cancer  This type of cancer can be detected and can often be prevented.  Routine colorectal cancer screening usually begins at age 20 and continues through age 56.  If you have risk factors for colon cancer, your health care provider may recommend that you be screened at an earlier age.  If you have a family history of colorectal cancer, talk with your health care provider about genetic screening.  Your health care provider may also recommend using  home test kits to check for hidden blood in your stool.  A small camera at the end of a tube can be used to examine your colon directly (sigmoidoscopy or colonoscopy). This is done to check for the earliest forms of colorectal cancer.  Direct examination of the colon should be repeated every 5-10 years until age 28. However, if early forms of precancerous polyps or small growths are found or if you have a family history or genetic risk for colorectal cancer, you may need to be screened more often. Skin Cancer  Check your skin from head to toe regularly.  Monitor any moles. Be sure to tell your health care provider:  About any new moles or changes in moles, especially if there is a change in a mole's shape or color.  If you have a mole that is larger than the size of a pencil eraser.  If any of your family members has a history of skin cancer, especially at a young age, talk with your health care provider about genetic screening.  Always use sunscreen. Apply sunscreen liberally and repeatedly throughout the day.  Whenever you are outside, protect yourself by wearing long sleeves, pants, a wide-brimmed hat, and sunglasses. WHAT SHOULD I KNOW ABOUT OSTEOPOROSIS? Osteoporosis is a condition in which bone destruction happens more quickly than new bone creation. After menopause, you may be at an increased risk for osteoporosis. To help prevent osteoporosis or the bone fractures that can happen because of osteoporosis, the following is recommended:  If you are 22-93 years old, get at least 1,000 mg of calcium and at least 600 mg of vitamin D per day.  If you are older than age 65 but younger than age 42, get at least 1,200 mg of calcium and at least 600 mg of vitamin D per day.  If you are older than age 79, get at least 1,200 mg of calcium and at least 800 mg of vitamin D per day. Smoking and excessive alcohol intake increase the risk of osteoporosis. Eat foods that are rich in calcium and  vitamin D, and do weight-bearing exercises several times each week as directed by your health care provider. WHAT SHOULD I KNOW ABOUT HOW MENOPAUSE AFFECTS [place]? Depression may occur at any age, but it is more common as you become older. Common symptoms of depression include:  Low or sad mood.  Changes in sleep patterns.  Changes in appetite or eating patterns.  Feeling an overall lack of motivation or enjoyment of activities that you previously enjoyed.  Frequent crying spells. Talk with your health care provider if you think that you are experiencing depression. WHAT SHOULD I KNOW ABOUT IMMUNIZATIONS? It is important that you get and maintain your immunizations. These include:  Tetanus, diphtheria, and pertussis (Tdap) booster vaccine.  Influenza every year before the  flu season begins.  Pneumonia vaccine.  Shingles vaccine. Your health care provider may also recommend other immunizations.   This information is not intended to replace advice given to you by your health care provider. Make sure you discuss any questions you have with your health care provider.   Document Released: 08/29/2005 Document Revised: 07/28/2014 Document Reviewed: 03/09/2014 Elsevier Interactive Patient Education [employer].

## 2015-09-03 NOTE — Progress Notes (Signed)
Subjective:    Patient ID: [name], female    DOB: 17-Aug-1945, 70 y.o.   MRN: [id number]  HPI   Wt Readings from Last 3 Encounters:  09/03/15 127 lb (57.607 kg)  06/12/14 121 lb (54.885 kg)  09/12/13 119 lb (53.8 kg)   70 year old patient who is seen today for a wellness visit.  She has a history of mild anxiety disorder and COPD.  No real concerns or complaints. She still works full-time as a Pharmacist, hospital  Patient has had a recent ENT evaluation.  She is scheduled for a follow-up mammogram next month It has been greater than 10 years since her last colonoscopy  Past Medical History  Diagnosis Date  . HERPES ZOSTER 10/12/2008  . ANXIETY 10/12/2008  . COPD 03/26/2009  . Cancer (HCC)     breast  . Palpitations   . Lumbago   . Acute upper respiratory infections of unspecified site   . Urinary tract infection, site not specified   . Syncopal episodes 8/10    "presyncopal" prbly vasovagal-MI ruled out; EKG normal; telemetry with PVC's/PAC's. Stress echo-no evidence for ischemia. EF 65-70%. No valvular abnormalities, no pulm. HTN    Social History   Social History  . Marital Status: Married    Spouse Name: N/A  . Number of Children: 2  . Years of Education: N/A   Occupational History  . Part-time Print production planner    Social History Main Topics  . Smoking status: Former Research scientist (life sciences)  . Smokeless tobacco: Not on file  . Alcohol Use: 0.0 oz/week    7-14 Glasses of wine per week     Comment: wodka tonic every night, sometimes 2.   . Drug Use: No  . Sexual Activity: Not on file   Other Topics Concern  . Not on file   Social History Narrative    Past Surgical History  Procedure Laterality Date  . Oophorectomy      Family History  Problem Relation Age of Onset  . Dementia Mother   . Parkinsonism Father   . Diabetes Brother     Allergies  Allergen Reactions  . Codeine Phosphate   . Ephedrine Hcl   . Penicillins     REACTION: per allergy test    No current  outpatient prescriptions on file prior to visit.   No current facility-administered medications on file prior to visit.    BP 122/80 mmHg  Pulse 71  Temp(Src) 98.5 F (36.9 C) (Oral)  Resp 18  Ht '5\' 5"'$  (1.651 m)  Wt 127 lb (57.607 kg)  BMI 21.13 kg/m2  SpO2 98%  Family history.  Mother age 60 is in reasonably good health.  Father died age 38 history of Parkinson's disease One brother with diabetes and chronic low back pain    Review of Systems  Constitutional: Negative.   HENT: Negative for congestion, dental problem, hearing loss, rhinorrhea, sinus pressure, sore throat and tinnitus.   Eyes: Negative for pain, discharge and visual disturbance.  Respiratory: Negative for cough and shortness of breath.   Cardiovascular: Negative for chest pain, palpitations and leg swelling.  Gastrointestinal: Negative for nausea, vomiting, abdominal pain, diarrhea, constipation, blood in stool and abdominal distention.  Genitourinary: Negative for dysuria, urgency, frequency, hematuria, flank pain, vaginal bleeding, vaginal discharge, difficulty urinating, vaginal pain and pelvic pain.  Musculoskeletal: Negative for joint swelling, arthralgias and gait problem.  Skin: Negative for rash.  Neurological: Negative for dizziness, syncope, speech difficulty, weakness, numbness and headaches.  Hematological:  Negative for adenopathy.  Psychiatric/Behavioral: Negative for behavioral problems, dysphoric mood and agitation. The patient is not nervous/anxious.     Subjective:    Patient ID: [name], female    DOB: [birth year], 71 y.o.   MRN: [id number]    Patient had left rotator cuff surgery performed by Dr. Noemi Chapel   Allergies: 1) ! Codeine Phosphate (Codeine Phosphate) 2) ! Ephedrine Hcl (Ephedrine Hcl) 3) * Dextran 70-Hypromellose 4) Penicillin G Potassium (Penicillin G Potassium)  Past History:  Past Medical History: Reviewed history from 03/29/2009 and no changes required. 1.  COPD (ICD-496): Prior smoker.  2. HERPES ZOSTER (ICD-053.9) 3. ANXIETY (ICD-300.00) 4. Presyncopal episode 8/10: probably vasovagal. Admitted, MI ruled out, ECG normal, telemetry with PVCs/PACs.  - Stress echo: no evidence for ischemia. EF 65-70%, no valvular abnormalities, no pulmonary HTN.  5. Palpitations: Likely secondary to PACs/PVCs. Suppressed with metoprolol.  6. Breast cancer: radiation. 7. S/p ovariectomy.      1. Risk factors, based on past  M,S,F history.  No cardiovascular risk factors  2.  Physical activities: Remains quite active.  Still fully employed as a Pharmacist, hospital.  No activity restrictions  3.  Depression/mood: No history of major depression or mood disorder.  Does have a history of mild anxiety  4.  Hearing: No deficits  5.  ADL's: Totally independent  6.  Fall risk: Low  7.  Home safety: No problems identified  8.  Height weight, and visual acuity; height and weight stable no change in visual acuity  9.  Counseling: Heart healthy diet recommended.  Follow colonoscopy.  Encouraged annual mammograms recommended  10. Lab orders based on risk factors: Laboratory studies including lipid profile will be reviewed  11. Referral : Follow-up mammogram.  GI referral for colonoscopy  12. Care plan: Continue heart healthy diet.  Follow-up mammogram and colonoscopy  13. Cognitive assessment: Alert and oriented normal affect.  No cognitive dysfunction  14. Screening: Patient provided with a written and personalized 5-10 year screening schedule in the AVS.    15. Provider List Update: GI primary care and radiology                  Review of Systems  Constitutional: Negative.   HENT: Negative for congestion, dental problem, hearing loss, rhinorrhea, sinus pressure, sore throat and tinnitus.   Eyes: Negative for pain, discharge and visual disturbance.  Respiratory: Negative for cough and shortness of breath.   Cardiovascular: Negative for chest  pain, palpitations and leg swelling.  Gastrointestinal: Negative for nausea, vomiting, abdominal pain, diarrhea, constipation, blood in stool and abdominal distention.  Genitourinary: Negative for dysuria, urgency, frequency, hematuria, flank pain, vaginal bleeding, vaginal discharge, difficulty urinating, vaginal pain and pelvic pain.  Musculoskeletal: Negative for arthralgias, gait problem and joint swelling.  Skin: Negative for rash.  Neurological: Negative for dizziness, syncope, speech difficulty, weakness, numbness and headaches.  Hematological: Negative for adenopathy.  Psychiatric/Behavioral: Negative for behavioral problems, dysphoric mood and agitation. The patient is not nervous/anxious.        Objective:   Physical Exam  Constitutional: She is oriented to person, place, and time. She appears well-developed and well-nourished.  HENT:  Head: Normocephalic.  Right Ear: External ear normal.  Left Ear: External ear normal.  Mouth/Throat: Oropharynx is clear and moist.  Eyes: Conjunctivae and EOM are normal. Pupils are equal, round, and reactive to light.  Neck: Normal range of motion. Neck supple. No thyromegaly present.  Cardiovascular: Normal rate, regular rhythm and normal heart  sounds.   Absent left dorsalis pedis pulse  Pulmonary/Chest: Effort normal and breath sounds normal.  Abdominal: Soft. Bowel sounds are normal. She exhibits no mass. There is no tenderness.  Musculoskeletal: Normal range of motion.  Lymphadenopathy:    She has no cervical adenopathy.  Neurological: She is alert and oriented to person, place, and time.  Skin: Skin is warm and dry. No rash noted.  Psychiatric: She has a normal mood and affect. Her behavior is normal.          Assessment & Plan:   Mild anxiety disorder stable History palpitations  We'll schedule for a complete exam at her convenience     Objective:   Physical Exam  Constitutional: She is oriented to person, place, and  time. She appears well-developed and well-nourished.  HENT:  Head: Normocephalic.  Right Ear: External ear normal.  Left Ear: External ear normal.  Mouth/Throat: Oropharynx is clear and moist.  Eyes: Conjunctivae and EOM are normal. Pupils are equal, round, and reactive to light.  Neck: Normal range of motion. Neck supple. No thyromegaly present.  Cardiovascular: Normal rate, regular rhythm, normal heart sounds and intact distal pulses.   Pulmonary/Chest: Effort normal and breath sounds normal.  Abdominal: Soft. Bowel sounds are normal. She exhibits no mass. There is no tenderness.  Musculoskeletal: Normal range of motion.  Lymphadenopathy:    She has no cervical adenopathy.  Neurological: She is alert and oriented to person, place, and time.  Skin: Skin is warm and dry. No rash noted.  Psychiatric: She has a normal mood and affect. Her behavior is normal.          Assessment & Plan:   Anxiety disorder.  Continue present regimen History mild COPD.  Asymptomatic  Schedule CPX Medications updated

## 2015-09-03 NOTE — Progress Notes (Signed)
Pre visit review using our clinic review tool, if applicable. No additional management support is needed unless otherwise documented below in the visit note. 

## 2015-09-18 DIAGNOSIS — Z1231 Encounter for screening mammogram for malignant neoplasm of breast: Secondary | ICD-10-CM | POA: Diagnosis not present

## 2015-09-18 DIAGNOSIS — Z853 Personal history of malignant neoplasm of breast: Secondary | ICD-10-CM | POA: Diagnosis not present

## 2015-09-18 LAB — HM MAMMOGRAPHY

## 2015-09-26 ENCOUNTER — Encounter: Payer: Self-pay | Admitting: Internal Medicine

## 2016-04-27 ENCOUNTER — Other Ambulatory Visit: Payer: Self-pay | Admitting: Internal Medicine

## 2016-06-09 DIAGNOSIS — J0100 Acute maxillary sinusitis, unspecified: Secondary | ICD-10-CM | POA: Diagnosis not present

## 2016-06-09 DIAGNOSIS — J300 Vasomotor rhinitis: Secondary | ICD-10-CM | POA: Diagnosis not present

## 2016-06-09 DIAGNOSIS — R05 Cough: Secondary | ICD-10-CM | POA: Diagnosis not present

## 2016-06-18 DIAGNOSIS — Z23 Encounter for immunization: Secondary | ICD-10-CM | POA: Diagnosis not present

## 2016-08-25 ENCOUNTER — Encounter: Payer: Self-pay | Admitting: Internal Medicine

## 2016-09-18 ENCOUNTER — Encounter: Payer: No Typology Code available for payment source | Admitting: Internal Medicine

## 2016-09-23 DIAGNOSIS — Z853 Personal history of malignant neoplasm of breast: Secondary | ICD-10-CM | POA: Diagnosis not present

## 2016-09-23 DIAGNOSIS — Z1231 Encounter for screening mammogram for malignant neoplasm of breast: Secondary | ICD-10-CM | POA: Diagnosis not present

## 2016-09-23 LAB — HM MAMMOGRAPHY

## 2016-09-26 ENCOUNTER — Encounter: Payer: Self-pay | Admitting: Internal Medicine

## 2016-10-13 ENCOUNTER — Ambulatory Visit (AMBULATORY_SURGERY_CENTER): Payer: Self-pay

## 2016-10-13 VITALS — Ht 67.0 in | Wt 126.0 lb

## 2016-10-13 DIAGNOSIS — Z1211 Encounter for screening for malignant neoplasm of colon: Secondary | ICD-10-CM

## 2016-10-13 MED ORDER — NA SULFATE-K SULFATE-MG SULF 17.5-3.13-1.6 GM/177ML PO SOLN: 1.0000 | Freq: Once | ORAL | 0 refills | Status: AC

## 2016-10-13 NOTE — Progress Notes (Signed)
Denies allergies to eggs or soy products. Denies complication of anesthesia or sedation. Denies use of weight loss medication. Denies use of O2.   Emmi instructions declined,

## 2016-10-27 ENCOUNTER — Encounter: Payer: Self-pay | Admitting: Internal Medicine

## 2016-10-27 ENCOUNTER — Encounter: Payer: No Typology Code available for payment source | Admitting: Internal Medicine

## 2016-11-10 ENCOUNTER — Encounter: Payer: No Typology Code available for payment source | Admitting: Internal Medicine

## 2016-11-14 ENCOUNTER — Other Ambulatory Visit: Payer: Self-pay | Admitting: Internal Medicine

## 2016-11-14 DIAGNOSIS — Z13220 Encounter for screening for lipoid disorders: Secondary | ICD-10-CM

## 2016-11-14 DIAGNOSIS — M84375A Stress fracture, left foot, initial encounter for fracture: Secondary | ICD-10-CM | POA: Diagnosis not present

## 2016-11-17 ENCOUNTER — Other Ambulatory Visit: Payer: Self-pay | Admitting: Internal Medicine

## 2016-11-17 ENCOUNTER — Other Ambulatory Visit (INDEPENDENT_AMBULATORY_CARE_PROVIDER_SITE_OTHER): Payer: PPO

## 2016-11-17 DIAGNOSIS — Z13220 Encounter for screening for lipoid disorders: Secondary | ICD-10-CM

## 2016-11-17 DIAGNOSIS — R300 Dysuria: Secondary | ICD-10-CM | POA: Diagnosis not present

## 2016-11-17 LAB — CBC WITH DIFFERENTIAL/PLATELET
BASOS ABS: 0.1 10*3/uL (ref 0.0–0.1)
Basophils Relative: 1.2 % (ref 0.0–3.0)
Eosinophils Absolute: 0.3 10*3/uL (ref 0.0–0.7)
Eosinophils Relative: 5.4 % — ABNORMAL HIGH (ref 0.0–5.0)
HEMATOCRIT: 38.3 % (ref 36.0–46.0)
Hemoglobin: 12.9 g/dL (ref 12.0–15.0)
LYMPHS PCT: 23.6 % (ref 12.0–46.0)
Lymphs Abs: 1.3 10*3/uL (ref 0.7–4.0)
MCHC: 33.6 g/dL (ref 30.0–36.0)
MCV: 97.9 fl (ref 78.0–100.0)
MONOS PCT: 5.4 % (ref 3.0–12.0)
Monocytes Absolute: 0.3 10*3/uL (ref 0.1–1.0)
NEUTROS ABS: 3.5 10*3/uL (ref 1.4–7.7)
Neutrophils Relative %: 64.4 % (ref 43.0–77.0)
Platelets: 226 10*3/uL (ref 150.0–400.0)
RBC: 3.91 Mil/uL (ref 3.87–5.11)
RDW: 12.7 % (ref 11.5–15.5)
WBC: 5.4 10*3/uL (ref 4.0–10.5)

## 2016-11-17 LAB — LIPID PANEL
Cholesterol: 252 mg/dL — ABNORMAL HIGH (ref 0–200)
HDL: 76.4 mg/dL (ref >39.00)
LDL Cholesterol: 159 mg/dL — ABNORMAL HIGH (ref 0–99)
NONHDL: 176.09
Total CHOL/HDL Ratio: 3
Triglycerides: 85 mg/dL (ref 0.0–149.0)
VLDL: 17 mg/dL (ref 0.0–40.0)

## 2016-11-17 LAB — COMPREHENSIVE METABOLIC PANEL
ALBUMIN: 4.3 g/dL (ref 3.5–5.2)
ALK PHOS: 80 U/L (ref 39–117)
ALT: 11 U/L (ref 0–35)
AST: 18 U/L (ref 0–37)
BILIRUBIN TOTAL: 0.6 mg/dL (ref 0.2–1.2)
BUN: 11 mg/dL (ref 6–23)
CO2: 29 mEq/L (ref 19–32)
CREATININE: 0.69 mg/dL (ref 0.40–1.20)
Calcium: 9.3 mg/dL (ref 8.4–10.5)
Chloride: 101 mEq/L (ref 96–112)
GFR: 89.07 mL/min (ref >60.00)
GLUCOSE: 82 mg/dL (ref 70–99)
Potassium: 3.9 mEq/L (ref 3.5–5.1)
SODIUM: 138 meq/L (ref 135–145)
TOTAL PROTEIN: 6.8 g/dL (ref 6.0–8.3)

## 2016-11-17 LAB — TSH: TSH: 3.88 u[IU]/mL (ref 0.35–4.50)

## 2016-11-17 LAB — POC URINALSYSI DIPSTICK (AUTOMATED)
BILIRUBIN UA: NEGATIVE
GLUCOSE UA: NEGATIVE
KETONES UA: NEGATIVE
NITRITE UA: NEGATIVE
Protein, UA: NEGATIVE
RBC UA: NEGATIVE
Spec Grav, UA: 1.015 (ref 1.010–1.025)
Urobilinogen, UA: 0.2 E.U./dL
pH, UA: 6 (ref 5.0–8.0)

## 2016-11-17 MED ORDER — SULFAMETHOXAZOLE-TRIMETHOPRIM 800-160 MG PO TABS: 1.0000 | ORAL_TABLET | Freq: Two times a day (BID) | ORAL | 0 refills | Status: DC

## 2016-11-21 ENCOUNTER — Ambulatory Visit (INDEPENDENT_AMBULATORY_CARE_PROVIDER_SITE_OTHER): Payer: PPO | Admitting: Internal Medicine

## 2016-11-21 ENCOUNTER — Encounter: Payer: Self-pay | Admitting: Internal Medicine

## 2016-11-21 VITALS — BP 140/78 | HR 69 | Temp 98.2°F | Ht 67.0 in | Wt 124.2 lb

## 2016-11-21 DIAGNOSIS — Z0000 Encounter for general adult medical examination without abnormal findings: Secondary | ICD-10-CM

## 2016-11-21 DIAGNOSIS — I10 Essential (primary) hypertension: Secondary | ICD-10-CM

## 2016-11-21 DIAGNOSIS — Z0001 Encounter for general adult medical examination with abnormal findings: Secondary | ICD-10-CM | POA: Diagnosis not present

## 2016-11-21 MED ORDER — METOPROLOL TARTRATE 25 MG PO TABS: 25.0000 mg | ORAL_TABLET | Freq: Two times a day (BID) | ORAL | 1 refills | Status: DC

## 2016-11-21 MED ORDER — ZOLPIDEM TARTRATE 5 MG PO TABS: 5.0000 mg | ORAL_TABLET | Freq: Every evening | ORAL | 1 refills | Status: DC | PRN

## 2016-11-21 NOTE — Patient Instructions (Addendum)
WE NOW OFFER   [name] FAST TRACK!!!  SAME DAY Appointments for ACUTE CARE  Such as: Sprains, Injuries, cuts, abrasions, rashes, muscle pain, joint pain, back pain Colds, flu, sore throats, headache, allergies, cough, fever  Ear pain, sinus and eye infections Abdominal pain, nausea, vomiting, diarrhea, upset stomach Animal/insect bites  3 Easy Ways to Schedule: Walk-In Scheduling Call in scheduling Mychart Sign-up: [email or web address]  Limit your sodium (Salt) intake  Please check your blood pressure on a regular basis.  If it is consistently greater than 150/90, please make an office appointment.    It is important that you exercise regularly, at least 20 minutes 3 to 4 times per week.  If you develop chest pain or shortness of breath seek  medical attention.         Health Maintenance for Postmenopausal Women Menopause is a normal process in which your reproductive ability comes to an end. This process happens gradually over a span of months to years, usually between the ages of 48 and 55. Menopause is complete when you have missed 12 consecutive menstrual periods. It is important to talk with your health care provider about some of the most common conditions that affect postmenopausal women, such as heart disease, cancer, and bone loss (osteoporosis). Adopting a healthy lifestyle and getting preventive care can help to promote your health and wellness. Those actions can also lower your chances of developing some of these common conditions. What should I know about menopause? During menopause, you may experience a number of symptoms, such as:  Moderate-to-severe hot flashes.  Night sweats.  Decrease in sex drive.  Mood swings.  Headaches.  Tiredness.  Irritability.  Memory problems.  Insomnia. Choosing to treat or not to treat menopausal changes is an individual decision that you make with your health care provider. What should I know  about hormone replacement therapy and supplements? Hormone therapy products are effective for treating symptoms that are associated with menopause, such as hot flashes and night sweats. Hormone replacement carries certain risks, especially as you become older. If you are thinking about using estrogen or estrogen with progestin treatments, discuss the benefits and risks with your health care provider. What should I know about heart disease and stroke? Heart disease, heart attack, and stroke become more likely as you age. This may be due, in part, to the hormonal changes that your body experiences during menopause. These can affect how your body processes dietary fats, triglycerides, and cholesterol. Heart attack and stroke are both medical emergencies. There are many things that you can do to help prevent heart disease and stroke:  Have your blood pressure checked at least every 1-2 years. High blood pressure causes heart disease and increases the risk of stroke.  If you are 55-79 years old, ask your health care provider if you should take aspirin to prevent a heart attack or a stroke.  Do not use any tobacco products, including cigarettes, chewing tobacco, or electronic cigarettes. If you need help quitting, ask your health care provider.  It is important to eat a healthy diet and maintain a healthy weight.  Be sure to include plenty of vegetables, fruits, low-fat dairy products, and lean protein.  Avoid eating foods that are high in solid fats, added sugars, or salt (sodium).  Get regular exercise. This is one of the most important things that you can do for your health.  Try to exercise for at least 150 minutes each week. The type of exercise that you do   should increase your heart rate and make you sweat. This is known as moderate-intensity exercise.  Try to do strengthening exercises at least twice each week. Do these in addition to the moderate-intensity exercise.  Know your numbers.Ask  your health care provider to check your cholesterol and your blood glucose. Continue to have your blood tested as directed by your health care provider. What should I know about cancer screening? There are several types of cancer. Take the following steps to reduce your risk and to catch any cancer development as early as possible. Breast Cancer  Practice breast self-awareness.  This means understanding how your breasts normally appear and feel.  It also means doing regular breast self-exams. Let your health care provider know about any changes, no matter how small.  If you are 40 or older, have a clinician do a breast exam (clinical breast exam or CBE) every year. Depending on your age, family history, and medical history, it may be recommended that you also have a yearly breast X-ray (mammogram).  If you have a family history of breast cancer, talk with your health care provider about genetic screening.  If you are at high risk for breast cancer, talk with your health care provider about having an MRI and a mammogram every year.  Breast cancer (BRCA) gene test is recommended for women who have family members with BRCA-related cancers. Results of the assessment will determine the need for genetic counseling and BRCA1 and for BRCA2 testing. BRCA-related cancers include these types:  Breast. This occurs in males or females.  Ovarian.  Tubal. This may also be called fallopian tube cancer.  Cancer of the abdominal or pelvic lining (peritoneal cancer).  Prostate.  Pancreatic. Cervical, Uterine, and Ovarian Cancer  Your health care provider may recommend that you be screened regularly for cancer of the pelvic organs. These include your ovaries, uterus, and vagina. This screening involves a pelvic exam, which includes checking for microscopic changes to the surface of your cervix (Pap test).  For women ages 21-65, health care providers may recommend a pelvic exam and a Pap test every three  years. For women ages 30-65, they may recommend the Pap test and pelvic exam, combined with testing for human papilloma virus (HPV), every five years. Some types of HPV increase your risk of cervical cancer. Testing for HPV may also be done on women of any age who have unclear Pap test results.  Other health care providers may not recommend any screening for nonpregnant women who are considered low risk for pelvic cancer and have no symptoms. Ask your health care provider if a screening pelvic exam is right for you.  If you have had past treatment for cervical cancer or a condition that could lead to cancer, you need Pap tests and screening for cancer for at least 20 years after your treatment. If Pap tests have been discontinued for you, your risk factors (such as having a new sexual partner) need to be reassessed to determine if you should start having screenings again. Some women have medical problems that increase the chance of getting cervical cancer. In these cases, your health care provider may recommend that you have screening and Pap tests more often.  If you have a family history of uterine cancer or ovarian cancer, talk with your health care provider about genetic screening.  If you have vaginal bleeding after reaching menopause, tell your health care provider.  There are currently no reliable tests available to screen for ovarian cancer.   Lung Cancer  Lung cancer screening is recommended for adults 55-80 years old who are at high risk for lung cancer because of a history of smoking. A yearly low-dose CT scan of the lungs is recommended if you:  Currently smoke.  Have a history of at least 30 pack-years of smoking and you currently smoke or have quit within the past 15 years. A pack-year is smoking an average of one pack of cigarettes per day for one year. Yearly screening should:  Continue until it has been 15 years since you quit.  Stop if you develop a health problem that would  prevent you from having lung cancer treatment. Colorectal Cancer  This type of cancer can be detected and can often be prevented.  Routine colorectal cancer screening usually begins at age 50 and continues through age 75.  If you have risk factors for colon cancer, your health care provider may recommend that you be screened at an earlier age.  If you have a family history of colorectal cancer, talk with your health care provider about genetic screening.  Your health care provider may also recommend using home test kits to check for hidden blood in your stool.  A small camera at the end of a tube can be used to examine your colon directly (sigmoidoscopy or colonoscopy). This is done to check for the earliest forms of colorectal cancer.  Direct examination of the colon should be repeated every 5-10 years until age 75. However, if early forms of precancerous polyps or small growths are found or if you have a family history or genetic risk for colorectal cancer, you may need to be screened more often. Skin Cancer  Check your skin from head to toe regularly.  Monitor any moles. Be sure to tell your health care provider:  About any new moles or changes in moles, especially if there is a change in a mole's shape or color.  If you have a mole that is larger than the size of a pencil eraser.  If any of your family members has a history of skin cancer, especially at a young age, talk with your health care provider about genetic screening.  Always use sunscreen. Apply sunscreen liberally and repeatedly throughout the day.  Whenever you are outside, protect yourself by wearing long sleeves, pants, a wide-brimmed hat, and sunglasses. What should I know about osteoporosis? Osteoporosis is a condition in which bone destruction happens more quickly than new bone creation. After menopause, you may be at an increased risk for osteoporosis. To help prevent osteoporosis or the bone fractures that can  happen because of osteoporosis, the following is recommended:  If you are 19-50 years old, get at least 1,000 mg of calcium and at least 600 mg of vitamin D per day.  If you are older than age 50 but younger than age 70, get at least 1,200 mg of calcium and at least 600 mg of vitamin D per day.  If you are older than age 70, get at least 1,200 mg of calcium and at least 800 mg of vitamin D per day. Smoking and excessive alcohol intake increase the risk of osteoporosis. Eat foods that are rich in calcium and vitamin D, and do weight-bearing exercises several times each week as directed by your health care provider. What should I know about how menopause affects my mental health? Depression may occur at any age, but it is more common as you become older. Common symptoms of depression include:  Low   or sad mood.  Changes in sleep patterns.  Changes in appetite or eating patterns.  Feeling an overall lack of motivation or enjoyment of activities that you previously enjoyed.  Frequent crying spells. Talk with your health care provider if you think that you are experiencing depression. What should I know about immunizations? It is important that you get and maintain your immunizations. These include:  Tetanus, diphtheria, and pertussis (Tdap) booster vaccine.  Influenza every year before the flu season begins.  Pneumonia vaccine.  Shingles vaccine. Your health care provider may also recommend other immunizations. This information is not intended to replace advice given to you by your health care provider. Make sure you discuss any questions you have with your health care provider. Document Released: 08/29/2005 Document Revised: 01/25/2016 Document Reviewed: 04/10/2015 [employer] Interactive Patient Education  2017 [employer]  

## 2016-11-21 NOTE — Progress Notes (Signed)
Pre visit review using our clinic review tool, if applicable. No additional management support is needed unless otherwise documented below in the visit note. 

## 2016-11-21 NOTE — Progress Notes (Signed)
Subjective:    Patient ID: [name], female    DOB: 12-11-1945, 71 y.o.   MRN: [id number]  HPI 71 year old patient who is seen today for a preventive health examination and Medicare wellness visit. She continues to do quite well.  She does have a history of mild COPD and chronic anxiety.  More recently she has been concern about some elevated blood pressure readings She has had some situational stress due to the death of her mother last year and her handling her estate.  She remains employed as a Radio producer  Past Medical History:  Diagnosis Date  . Acute upper respiratory infections of unspecified site   . Allergy   . ANXIETY 10/12/2008  . Arthritis   . Cancer ([place])    breast  . COPD 03/26/2009  . HERPES ZOSTER 10/12/2008  . Lumbago   . Palpitations   . Syncopal episodes 8/10   "presyncopal" prbly vasovagal-MI ruled out; EKG normal; telemetry with PVC's/PAC's. Stress echo-no evidence for ischemia. EF 65-70%. No valvular abnormalities, no pulm. HTN  . Urinary tract infection, site not specified      Social History   Social History  . Marital status: Married    Spouse name: N/A  . Number of children: 2  . Years of education: N/A   Occupational History  . Part-time preschool Radio broadcast assistant   Social History Main Topics  . Smoking status: Former Research scientist (life sciences)  . Smokeless tobacco: Never Used  . Alcohol use 0.0 oz/week    7 - 14 Glasses of wine per week     Comment: wodka tonic every night, sometimes 2.   . Drug use: No  . Sexual activity: Not on file   Other Topics Concern  . Not on file   Social History Narrative  . No narrative on file    Past Surgical History:  Procedure Laterality Date  . OOPHORECTOMY    . ROTATOR CUFF REPAIR Left 2014    Family History  Problem Relation Age of Onset  . Dementia Mother   . Parkinsonism Father   . Diabetes Brother   . Colon polyps Neg Hx   . Esophageal cancer Neg Hx   . Rectal cancer Neg Hx   . Stomach  cancer Neg Hx     Allergies  Allergen Reactions  . Codeine Phosphate   . Ephedrine Hcl   . Penicillins     REACTION: per allergy test    Current Outpatient Prescriptions on File Prior to Visit  Medication Sig Dispense Refill  . ALPRAZolam (XANAX) 0.5 MG tablet Take 1 tablet (0.5 mg total) by mouth at bedtime as needed for sleep. 90 tablet 1  . cetirizine (ZYRTEC) 10 MG tablet Take 10 mg by mouth daily.     Marland Kitchen ibuprofen (ADVIL,MOTRIN) 100 MG/5ML suspension Take 200 mg by mouth every 4 (four) hours as needed.    . sertraline (ZOLOFT) 50 MG tablet Take 1 tablet (50 mg total) by mouth daily. (Patient not taking: Reported on 11/21/2016) 90 tablet 3   No current facility-administered medications on file prior to visit.     BP 140/78 (BP Location: Left Arm, Patient Position: Sitting, Cuff Size: Normal)   Pulse 69   Temp 98.2 F (36.8 C) (Oral)   Ht '5\' 7"'$  (1.702 m)   Wt 124 lb 3.2 oz (56.3 kg)   SpO2 98%   BMI 19.45 kg/m   Medicare wellness visit   1. Risk factors, based on past  M,S,F  history.  No major cardiovascular risk factors  2.  Physical activities:remains quite active until recent fracture of the left foot remains employed as a Radio producer and quite active about the house  3.  Depression/mood:history of anxiety disorder, but no major depression  4.  Hearing:no deficits  5.  ADL's:independent  6.  Fall risk:low  7.  Home safety:no prominent identified  8.  Height weight, and visual acuity;height and weight stable no change in visual acuity  9.  Counseling:continue heart healthy diet and active lifestyle  10. Lab orders based on risk factors:laboratory profile including lipid panel reviewed  11. Referral :been appropriate at this time  12. Care plan:continue efforts at aggressive risk factor modification  13. Cognitive assessment: alert and appropriate with normal affect no cognitive dysfunction  14. Screening: Patient provided with a written and personalized  5-10 year screening schedule in the AVS.    15. Provider List Update: primary care ophthalmology and radiology    Review of Systems  Constitutional: Negative.   HENT: Negative for congestion, dental problem, hearing loss, rhinorrhea, sinus pressure, sore throat and tinnitus.   Eyes: Negative for pain, discharge and visual disturbance.  Respiratory: Negative for cough and shortness of breath.   Cardiovascular: Negative for chest pain, palpitations and leg swelling.  Gastrointestinal: Negative for abdominal distention, abdominal pain, blood in stool, constipation, diarrhea, nausea and vomiting.  Genitourinary: Negative for difficulty urinating, dysuria, flank pain, frequency, hematuria, pelvic pain, urgency, vaginal bleeding, vaginal discharge and vaginal pain.  Musculoskeletal: Negative for arthralgias, gait problem and joint swelling.       Recent fracture to the left foot  Skin: Negative for rash.  Neurological: Negative for dizziness, syncope, speech difficulty, weakness, numbness and headaches.  Hematological: Negative for adenopathy.  Psychiatric/Behavioral: Positive for sleep disturbance. Negative for agitation, behavioral problems and dysphoric mood. The patient is not nervous/anxious.        Objective:   Physical Exam  Constitutional: She is oriented to person, place, and time. She appears well-developed and well-nourished.  HENT:  Head: Normocephalic and atraumatic.  Right Ear: External ear normal.  Left Ear: External ear normal.  Mouth/Throat: Oropharynx is clear and moist.  Eyes: Conjunctivae and EOM are normal.  Neck: Normal range of motion. Neck supple. No JVD present. No thyromegaly present.  Cardiovascular: Normal rate, regular rhythm, normal heart sounds and intact distal pulses.   No murmur heard. Pulmonary/Chest: Effort normal and breath sounds normal. She has no wheezes. She has no rales.  Abdominal: Soft. Bowel sounds are normal. She exhibits no distension and  no mass. There is no tenderness. There is no rebound and no guarding.  Musculoskeletal: Normal range of motion. She exhibits no edema or tenderness.  Left foot in a soft cast  Neurological: She is alert and oriented to person, place, and time. She has normal reflexes. No cranial nerve deficit. She exhibits normal muscle tone. Coordination normal.  Skin: Skin is warm and dry. No rash noted.  Psychiatric: She has a normal mood and affect. Her behavior is normal.          Assessment & Plan:   Preventive health examination.  Colonoscopy declined.  Agreeable for Cologuard Mild hypertension.  Will increase metoprolol to 25 mg twice a day.  Continue home blood pressure monitoring Anxiety disorder Medicare wellness visit  Return in 6 months for follow-up Medications updated  continue home blood pressure monitoring  [id number],PETER Pilar Plate

## 2016-11-22 ENCOUNTER — Other Ambulatory Visit: Payer: Self-pay | Admitting: Internal Medicine

## 2016-11-24 ENCOUNTER — Telehealth: Payer: Self-pay

## 2016-11-24 ENCOUNTER — Ambulatory Visit: Payer: Self-pay | Admitting: Podiatry

## 2016-11-24 NOTE — Telephone Encounter (Signed)
Received PA request for Zolpidem Tartrate from [place]. PA has been submitted & is pending via covermymeds. Key: Toya Smothers

## 2016-11-26 NOTE — Telephone Encounter (Signed)
Pa approved, form faxed back to pharmacy.

## 2016-12-05 DIAGNOSIS — M84375D Stress fracture, left foot, subsequent encounter for fracture with routine healing: Secondary | ICD-10-CM | POA: Diagnosis not present

## 2016-12-26 DIAGNOSIS — M84375D Stress fracture, left foot, subsequent encounter for fracture with routine healing: Secondary | ICD-10-CM | POA: Diagnosis not present

## 2017-01-15 ENCOUNTER — Other Ambulatory Visit: Payer: Self-pay | Admitting: Internal Medicine

## 2017-02-18 ENCOUNTER — Other Ambulatory Visit: Payer: Self-pay | Admitting: Internal Medicine

## 2017-03-02 ENCOUNTER — Other Ambulatory Visit: Payer: Self-pay | Admitting: Internal Medicine

## 2017-04-13 ENCOUNTER — Other Ambulatory Visit: Payer: Self-pay | Admitting: Internal Medicine

## 2017-05-17 ENCOUNTER — Other Ambulatory Visit: Payer: Self-pay | Admitting: Internal Medicine

## 2017-06-08 DIAGNOSIS — R05 Cough: Secondary | ICD-10-CM | POA: Diagnosis not present

## 2017-06-08 DIAGNOSIS — J300 Vasomotor rhinitis: Secondary | ICD-10-CM | POA: Diagnosis not present

## 2017-06-08 DIAGNOSIS — J0100 Acute maxillary sinusitis, unspecified: Secondary | ICD-10-CM | POA: Diagnosis not present

## 2017-08-16 ENCOUNTER — Other Ambulatory Visit: Payer: Self-pay | Admitting: Internal Medicine

## 2017-09-04 DIAGNOSIS — S8002XA Contusion of left knee, initial encounter: Secondary | ICD-10-CM | POA: Diagnosis not present

## 2017-10-07 DIAGNOSIS — Z853 Personal history of malignant neoplasm of breast: Secondary | ICD-10-CM | POA: Diagnosis not present

## 2017-10-07 DIAGNOSIS — Z1231 Encounter for screening mammogram for malignant neoplasm of breast: Secondary | ICD-10-CM | POA: Diagnosis not present

## 2017-10-11 ENCOUNTER — Other Ambulatory Visit: Payer: Self-pay | Admitting: Internal Medicine

## 2017-10-12 ENCOUNTER — Other Ambulatory Visit: Payer: Self-pay | Admitting: Internal Medicine

## 2017-10-26 DIAGNOSIS — R05 Cough: Secondary | ICD-10-CM | POA: Diagnosis not present

## 2017-10-26 DIAGNOSIS — J0100 Acute maxillary sinusitis, unspecified: Secondary | ICD-10-CM | POA: Diagnosis not present

## 2017-10-26 DIAGNOSIS — J300 Vasomotor rhinitis: Secondary | ICD-10-CM | POA: Diagnosis not present

## 2017-11-10 ENCOUNTER — Other Ambulatory Visit: Payer: Self-pay | Admitting: Internal Medicine

## 2017-11-16 ENCOUNTER — Other Ambulatory Visit: Payer: Self-pay | Admitting: Internal Medicine

## 2017-12-13 ENCOUNTER — Other Ambulatory Visit: Payer: Self-pay | Admitting: Internal Medicine

## 2017-12-15 NOTE — Telephone Encounter (Signed)
Called patient and left message to return call to schedule CPE.

## 2017-12-17 ENCOUNTER — Other Ambulatory Visit: Payer: Self-pay | Admitting: Internal Medicine

## 2017-12-17 NOTE — Telephone Encounter (Signed)
Pt needs to make an appointment for more refills.

## 2017-12-21 NOTE — Telephone Encounter (Signed)
[name],  Patient is schedule for an appt for tomorrow

## 2017-12-21 NOTE — Telephone Encounter (Signed)
Left message to return phone call.

## 2017-12-22 ENCOUNTER — Ambulatory Visit (INDEPENDENT_AMBULATORY_CARE_PROVIDER_SITE_OTHER): Payer: PPO | Admitting: Internal Medicine

## 2017-12-22 ENCOUNTER — Encounter: Payer: Self-pay | Admitting: Internal Medicine

## 2017-12-22 VITALS — BP 132/78 | HR 62 | Temp 98.4°F | Wt 126.8 lb

## 2017-12-22 DIAGNOSIS — F411 Generalized anxiety disorder: Secondary | ICD-10-CM | POA: Diagnosis not present

## 2017-12-22 MED ORDER — ALPRAZOLAM 0.5 MG PO TABS: 0.5000 mg | ORAL_TABLET | Freq: Every evening | ORAL | 0 refills | Status: DC | PRN

## 2017-12-22 MED ORDER — SERTRALINE HCL 50 MG PO TABS: ORAL_TABLET | ORAL | 3 refills | Status: DC

## 2017-12-22 MED ORDER — METOPROLOL TARTRATE 25 MG PO TABS: 25.0000 mg | ORAL_TABLET | Freq: Two times a day (BID) | ORAL | 3 refills | Status: DC

## 2017-12-22 NOTE — Patient Instructions (Signed)
Return in the fall for your annual Medicare wellness visit and establish with a new provider

## 2017-12-22 NOTE — Progress Notes (Signed)
Subjective:    Patient ID: [name], female    DOB: 05-15-1946, 72 y.o.   MRN: [id number]  HPI  72 year old patient who is seen today after a 1 year absence.  She has a history of essential hypertension.  She has remote history of depression which has been stable but also an anxiety disorder.  She ran out of sertraline about 1 month ago and has felt more anxious and irritable. She has a history of episodic insomnia and one year ago was prescribed zolpidem which she has not taken.  She requests a refill of alprazolam to take at bedtime as needed  Past Medical History:  Diagnosis Date  . Acute upper respiratory infections of unspecified site   . Allergy   . ANXIETY 10/12/2008  . Arthritis   . Cancer ([place])    breast  . COPD 03/26/2009  . HERPES ZOSTER 10/12/2008  . Lumbago   . Palpitations   . Syncopal episodes 8/10   "presyncopal" prbly vasovagal-MI ruled out; EKG normal; telemetry with PVC's/PAC's. Stress echo-no evidence for ischemia. EF 65-70%. No valvular abnormalities, no pulm. HTN  . Urinary tract infection, site not specified      Social History   Socioeconomic History  . Marital status: Married    Spouse name: Not on file  . Number of children: 2  . Years of education: Not on file  . Highest education level: Not on file  Occupational History  . Occupation: Part-time Agricultural consultant: [employer]  Social Needs  . Financial resource strain: Not on file  . Food insecurity:    Worry: Not on file    Inability: Not on file  . Transportation needs:    Medical: Not on file    Non-medical: Not on file  Tobacco Use  . Smoking status: Former Research scientist (life sciences)  . Smokeless tobacco: Never Used  Substance and Sexual Activity  . Alcohol use: Yes    Alcohol/week: 0.0 oz    Types: 7 - 14 Glasses of wine per week    Comment: wodka tonic every night, sometimes 2.   . Drug use: No  . Sexual activity: Not on file  Lifestyle  . Physical activity:    Days per  week: Not on file    Minutes per session: Not on file  . Stress: Not on file  Relationships  . Social connections:    Talks on phone: Not on file    Gets together: Not on file    Attends religious service: Not on file    Active member of club or organization: Not on file    Attends meetings of clubs or organizations: Not on file    Relationship status: Not on file  . Intimate partner violence:    Fear of current or ex partner: Not on file    Emotionally abused: Not on file    Physically abused: Not on file    Forced sexual activity: Not on file  Other Topics Concern  . Not on file  Social History Narrative  . Not on file    Past Surgical History:  Procedure Laterality Date  . OOPHORECTOMY    . ROTATOR CUFF REPAIR Left 2014    Family History  Problem Relation Age of Onset  . Dementia Mother   . Parkinsonism Father   . Diabetes Brother   . Colon polyps Neg Hx   . Esophageal cancer Neg Hx   . Rectal cancer Neg Hx   .  Stomach cancer Neg Hx     Allergies  Allergen Reactions  . Codeine Phosphate   . Ephedrine Hcl   . Penicillins     REACTION: per allergy test    Current Outpatient Medications on File Prior to Visit  Medication Sig Dispense Refill  . cetirizine (ZYRTEC) 10 MG tablet Take 10 mg by mouth daily.     Marland Kitchen ibuprofen (ADVIL,MOTRIN) 100 MG/5ML suspension Take 200 mg by mouth every 4 (four) hours as needed.     No current facility-administered medications on file prior to visit.     BP 132/78 (BP Location: Right Arm, Patient Position: Sitting, Cuff Size: Normal)   Pulse 62   Temp 98.4 F (36.9 C) (Oral)   Wt 126 lb 12.8 oz (57.5 kg)   SpO2 98%   BMI 19.86 kg/m     Review of Systems  Psychiatric/Behavioral: Positive for sleep disturbance. The patient is nervous/anxious.        Objective:   Physical Exam  Constitutional: She appears well-developed and well-nourished. No distress.  Blood pressure 122/78  Psychiatric: She has a normal mood and  affect. Her behavior is normal. Judgment and thought content normal.          Assessment & Plan:  Hypertension well-controlled on present therapy  Anxiety disorder.  This has worsened since discontinuation of sertraline.  Will resume Insomnia we will continue as needed alprazolam.  Zolpidem discontinued  Suggested the patient return in the fall for annual health assessment with new provider  Nyoka Cowden

## 2018-01-15 ENCOUNTER — Other Ambulatory Visit: Payer: Self-pay | Admitting: Internal Medicine

## 2018-04-12 DIAGNOSIS — L821 Other seborrheic keratosis: Secondary | ICD-10-CM | POA: Diagnosis not present

## 2018-04-12 DIAGNOSIS — L570 Actinic keratosis: Secondary | ICD-10-CM | POA: Diagnosis not present

## 2018-06-07 DIAGNOSIS — R05 Cough: Secondary | ICD-10-CM | POA: Diagnosis not present

## 2018-06-07 DIAGNOSIS — J300 Vasomotor rhinitis: Secondary | ICD-10-CM | POA: Diagnosis not present

## 2018-09-13 ENCOUNTER — Encounter: Payer: Self-pay | Admitting: Family Medicine

## 2018-09-13 ENCOUNTER — Ambulatory Visit (INDEPENDENT_AMBULATORY_CARE_PROVIDER_SITE_OTHER): Payer: PPO | Admitting: Family Medicine

## 2018-09-13 VITALS — BP 124/76 | HR 63 | Temp 98.5°F | Ht 66.0 in | Wt 125.4 lb

## 2018-09-13 DIAGNOSIS — F411 Generalized anxiety disorder: Secondary | ICD-10-CM

## 2018-09-13 DIAGNOSIS — G4700 Insomnia, unspecified: Secondary | ICD-10-CM

## 2018-09-13 MED ORDER — ZOLPIDEM TARTRATE 5 MG PO TABS: 5.0000 mg | ORAL_TABLET | Freq: Every evening | ORAL | 5 refills | Status: DC | PRN

## 2018-09-13 MED ORDER — ALPRAZOLAM 0.5 MG PO TABS: 0.5000 mg | ORAL_TABLET | Freq: Every evening | ORAL | 5 refills | Status: DC | PRN

## 2018-09-13 NOTE — Progress Notes (Signed)
   Subjective:    Patient ID: [name], female    DOB: Jun 25, 1946, 73 y.o.   MRN: [id number]  HPI Here to establish with me for primary care after her previous PCP, Dr. Gerrit Halls, retired. I see her husband as well. She has been doing well except for trouble sleeping. She had been prescribed Ambien in the past and this worked very well for her, however she has been out of this for several weeks. She also uses Xanax as needed for anxiety. 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 Her school is in the process of becoming independent and they plan to be a separate entity in the next few months. Her last well exam was about 2 years ago.    Review of Systems  Constitutional: Negative.   Respiratory: Negative.   Cardiovascular: Negative.   Gastrointestinal: Negative.   Neurological: Negative.   Psychiatric/Behavioral: Positive for sleep disturbance. Negative for dysphoric mood. The patient is nervous/anxious.        Objective:   Physical Exam Constitutional:      Appearance: Normal appearance.  Cardiovascular:     Rate and Rhythm: Normal rate and regular rhythm.     Pulses: Normal pulses.     Heart sounds: Normal heart sounds.  Pulmonary:     Effort: Pulmonary effort is normal.     Breath sounds: Normal breath sounds.  Neurological:     General: No focal deficit present.     Mental Status: She is alert and oriented to person, place, and time.  Psychiatric:        Mood and Affect: Mood normal.        Thought Content: Thought content normal.        Judgment: Judgment normal.           Assessment & Plan:  Intro visit for this patient with anxiety and insomnia. We will refill the Ambien and the Xanax. She will return soon for a well exam and fasting labs.  [name], MD

## 2018-10-21 ENCOUNTER — Telehealth: Payer: Self-pay

## 2018-10-21 NOTE — Telephone Encounter (Signed)
Author phoned pt. to assess interest in scheduling virtual awv. No answer, author left detailed VM asking for return call if interested in doing with Dr. Sarajane Jews, and to reschedule CPE with Dr. Sarajane Jews for later in the year d/t covid-19. CMA made aware for possible follow-up.

## 2018-10-25 DIAGNOSIS — S62331D Displaced fracture of neck of second metacarpal bone, left hand, subsequent encounter for fracture with routine healing: Secondary | ICD-10-CM | POA: Diagnosis not present

## 2018-10-25 DIAGNOSIS — M25551 Pain in right hip: Secondary | ICD-10-CM | POA: Diagnosis not present

## 2018-10-25 DIAGNOSIS — M25642 Stiffness of left hand, not elsewhere classified: Secondary | ICD-10-CM | POA: Diagnosis not present

## 2018-10-25 DIAGNOSIS — M5416 Radiculopathy, lumbar region: Secondary | ICD-10-CM | POA: Diagnosis not present

## 2018-10-25 DIAGNOSIS — M545 Low back pain: Secondary | ICD-10-CM | POA: Diagnosis not present

## 2018-10-25 DIAGNOSIS — C3492 Malignant neoplasm of unspecified part of left bronchus or lung: Secondary | ICD-10-CM | POA: Diagnosis not present

## 2018-10-28 ENCOUNTER — Telehealth: Payer: Self-pay | Admitting: *Deleted

## 2018-10-28 NOTE — Telephone Encounter (Signed)
Copied from [place] [id number]. Topic: Appointment Scheduling - Scheduling Inquiry for Clinic >> Oct 28, 2018 10:46 AM Gustavus Messing wrote: Reason for CRM: Patient called to reschedule her 12/14/18 appointment.Anytime in June or July will work for her. She would like a call back on her home number [phone]

## 2018-11-23 DIAGNOSIS — M7061 Trochanteric bursitis, right hip: Secondary | ICD-10-CM | POA: Diagnosis not present

## 2018-11-23 DIAGNOSIS — M545 Low back pain: Secondary | ICD-10-CM | POA: Diagnosis not present

## 2018-11-25 NOTE — Telephone Encounter (Signed)
I have called the pt and lmomtcb to reschedule the appt per her request.

## 2018-11-30 NOTE — Telephone Encounter (Signed)
I have called the pt and she will keep this appt for her CPX

## 2018-12-14 ENCOUNTER — Ambulatory Visit (INDEPENDENT_AMBULATORY_CARE_PROVIDER_SITE_OTHER): Payer: PPO | Admitting: Family Medicine

## 2018-12-14 ENCOUNTER — Other Ambulatory Visit: Payer: Self-pay

## 2018-12-14 ENCOUNTER — Encounter: Payer: Self-pay | Admitting: Family Medicine

## 2018-12-14 VITALS — BP 120/64 | HR 74 | Temp 98.5°F | Ht 66.0 in | Wt 129.2 lb

## 2018-12-14 DIAGNOSIS — M810 Age-related osteoporosis without current pathological fracture: Secondary | ICD-10-CM

## 2018-12-14 DIAGNOSIS — Z0000 Encounter for general adult medical examination without abnormal findings: Secondary | ICD-10-CM

## 2018-12-14 LAB — POC URINALSYSI DIPSTICK (AUTOMATED)
Bilirubin, UA: NEGATIVE
Blood, UA: NEGATIVE
Glucose, UA: NEGATIVE
Ketones, UA: NEGATIVE
Leukocytes, UA: NEGATIVE
Nitrite, UA: NEGATIVE
Protein, UA: NEGATIVE
Spec Grav, UA: 1.02 (ref 1.010–1.025)
Urobilinogen, UA: 0.2 E.U./dL
pH, UA: 6 (ref 5.0–8.0)

## 2018-12-14 LAB — CBC WITH DIFFERENTIAL/PLATELET
Basophils Absolute: 0 10*3/uL (ref 0.0–0.1)
Basophils Relative: 1 % (ref 0.0–3.0)
Eosinophils Absolute: 0.3 10*3/uL (ref 0.0–0.7)
Eosinophils Relative: 5.6 % — ABNORMAL HIGH (ref 0.0–5.0)
HCT: 35.7 % — ABNORMAL LOW (ref 36.0–46.0)
Hemoglobin: 12.4 g/dL (ref 12.0–15.0)
Lymphocytes Relative: 21.3 % (ref 12.0–46.0)
Lymphs Abs: 1.1 10*3/uL (ref 0.7–4.0)
MCHC: 34.8 g/dL (ref 30.0–36.0)
MCV: 99 fl (ref 78.0–100.0)
Monocytes Absolute: 0.3 10*3/uL (ref 0.1–1.0)
Monocytes Relative: 6.8 % (ref 3.0–12.0)
Neutro Abs: 3.3 10*3/uL (ref 1.4–7.7)
Neutrophils Relative %: 65.3 % (ref 43.0–77.0)
Platelets: 206 10*3/uL (ref 150.0–400.0)
RBC: 3.6 Mil/uL — ABNORMAL LOW (ref 3.87–5.11)
RDW: 13.7 % (ref 11.5–15.5)
WBC: 5 10*3/uL (ref 4.0–10.5)

## 2018-12-14 LAB — BASIC METABOLIC PANEL
BUN: 10 mg/dL (ref 6–23)
CO2: 28 mEq/L (ref 19–32)
Calcium: 8.7 mg/dL (ref 8.4–10.5)
Chloride: 106 mEq/L (ref 96–112)
Creatinine, Ser: 0.72 mg/dL (ref 0.40–1.20)
GFR: 79.32 mL/min (ref >60.00)
Glucose, Bld: 86 mg/dL (ref 70–99)
Potassium: 4.5 mEq/L (ref 3.5–5.1)
Sodium: 140 mEq/L (ref 135–145)

## 2018-12-14 LAB — HEPATIC FUNCTION PANEL
ALT: 12 U/L (ref 0–35)
AST: 28 U/L (ref 0–37)
Albumin: 3.8 g/dL (ref 3.5–5.2)
Alkaline Phosphatase: 77 U/L (ref 39–117)
Bilirubin, Direct: 0.1 mg/dL (ref 0.0–0.3)
Total Bilirubin: 0.8 mg/dL (ref 0.2–1.2)
Total Protein: 6.1 g/dL (ref 6.0–8.3)

## 2018-12-14 LAB — LIPID PANEL
Cholesterol: 275 mg/dL — ABNORMAL HIGH (ref 0–200)
HDL: 68.8 mg/dL (ref >39.00)
LDL Cholesterol: 185 mg/dL — ABNORMAL HIGH (ref 0–99)
NonHDL: 206.68
Total CHOL/HDL Ratio: 4
Triglycerides: 109 mg/dL (ref 0.0–149.0)
VLDL: 21.8 mg/dL (ref 0.0–40.0)

## 2018-12-14 LAB — TSH: TSH: 2.23 u[IU]/mL (ref 0.35–4.50)

## 2018-12-14 MED ORDER — METOPROLOL TARTRATE 25 MG PO TABS: 25.0000 mg | ORAL_TABLET | Freq: Two times a day (BID) | ORAL | 3 refills | Status: DC

## 2018-12-14 NOTE — Progress Notes (Signed)
   Subjective:    Patient ID: [name], female    DOB: 1946-06-25, 73 y.o.   MRN: [id number]  HPI Here for a well exam. She feels great. She walks every day for exercise. She is sleeping well with Ambien. Here last colonoscopy was in 2004, and her last DEXA with Dr. Cherylann Banas was over 10 years ago.    Review of Systems  Constitutional: Negative.   HENT: Negative.   Eyes: Negative.   Respiratory: Negative.   Cardiovascular: Negative.   Gastrointestinal: Negative.   Genitourinary: Negative for decreased urine volume, difficulty urinating, dyspareunia, dysuria, enuresis, flank pain, frequency, hematuria, pelvic pain and urgency.  Musculoskeletal: Negative.   Skin: Negative.   Neurological: Negative.   Psychiatric/Behavioral: Negative.        Objective:   Physical Exam Constitutional:      General: She is not in acute distress.    Appearance: She is well-developed.  HENT:     Head: Normocephalic and atraumatic.     Right Ear: External ear normal.     Left Ear: External ear normal.     Nose: Nose normal.     Mouth/Throat:     Pharynx: No oropharyngeal exudate.  Eyes:     General: No scleral icterus.    Conjunctiva/sclera: Conjunctivae normal.     Pupils: Pupils are equal, round, and reactive to light.  Neck:     Musculoskeletal: Normal range of motion and neck supple.     Thyroid: No thyromegaly.     Vascular: No JVD.  Cardiovascular:     Rate and Rhythm: Normal rate and regular rhythm.     Heart sounds: Normal heart sounds. No murmur. No friction rub. No gallop.   Pulmonary:     Effort: Pulmonary effort is normal. No respiratory distress.     Breath sounds: Normal breath sounds. No wheezing or rales.  Chest:     Chest wall: No tenderness.  Abdominal:     General: Bowel sounds are normal. There is no distension.     Palpations: Abdomen is soft. There is no mass.     Tenderness: There is no abdominal tenderness. There is no guarding or rebound.  Musculoskeletal:  Normal range of motion.        General: No tenderness.  Lymphadenopathy:     Cervical: No cervical adenopathy.  Skin:    General: Skin is warm and dry.     Findings: No erythema or rash.  Neurological:     Mental Status: She is alert and oriented to person, place, and time.     Cranial Nerves: No cranial nerve deficit.     Motor: No abnormal muscle tone.     Coordination: Coordination normal.     Deep Tendon Reflexes: Reflexes are normal and symmetric. Reflexes normal.  Psychiatric:        Behavior: Behavior normal.        Thought Content: Thought content normal.        Judgment: Judgment normal.           Assessment & Plan:  Well exam. We discussed diet and exercise. Get fasting labs. Set up a colonoscopy and a DEXA soon.  [name], MD

## 2018-12-28 ENCOUNTER — Other Ambulatory Visit: Payer: Self-pay | Admitting: Sports Medicine

## 2018-12-28 DIAGNOSIS — M545 Low back pain, unspecified: Secondary | ICD-10-CM

## 2019-01-05 ENCOUNTER — Telehealth: Payer: Self-pay

## 2019-01-05 NOTE — Telephone Encounter (Signed)
Lm on vm; called regarding screening

## 2019-01-10 NOTE — Telephone Encounter (Signed)
Phone screening complete 

## 2019-01-11 ENCOUNTER — Ambulatory Visit (INDEPENDENT_AMBULATORY_CARE_PROVIDER_SITE_OTHER): Payer: PPO | Admitting: Internal Medicine

## 2019-01-11 ENCOUNTER — Encounter: Payer: Self-pay | Admitting: Internal Medicine

## 2019-01-11 VITALS — Ht 66.0 in | Wt 129.0 lb

## 2019-01-11 DIAGNOSIS — Z1211 Encounter for screening for malignant neoplasm of colon: Secondary | ICD-10-CM | POA: Diagnosis not present

## 2019-01-11 NOTE — Patient Instructions (Signed)
Your provider has ordered Cologuard testing as an option for colon cancer screening. This is performed by [employer] [facility] and may be out of network with your insurance. PRIOR to completing the test, it is YOUR responsibility to contact your insurance about covered benefits for this test. Your out of pocket expense could be anywhere from $0.00 to $649.00.   When you call to check coverage with your insurer, please provide the following information:   -The ONLY provider of Cologuard is [place] code for Cologuard is [phone].  [employer] NPI # [id number]  -[employer] Tax ID # [id number]   We have already sent your demographic and insurance information to [facility] (phone number [phone]) and they should contact you within the next week regarding your test. If you have not heard from them within the next week, please call our office at [phone].  Thank you for entrusting me with your care and choosing [facility].  Dr Henrene Pastor

## 2019-01-11 NOTE — Progress Notes (Signed)
HISTORY OF PRESENT ILLNESS:  [name] is a 73 y.o. female with history of breast cancer who schedules this telehealth medicine visit during the coronavirus pandemic at the encouragement of her primary care provider Dr. Sarajane Jews to discuss colon cancer screening strategies.  She last underwent complete colonoscopy April 20, 2003.  Examination was normal.  She has not had follow-up screening in any form since.  Her GI review of systems is entirely negative.  There is no family history of colon cancer.  Review of her outside laboratories from 03/16/2019 shows normal comprehensive metabolic panel and normal CBC with hemoglobin 12.4.  Previous relevant x-ray includes CT scan of the abdomen 2008 which was unremarkable.  In addition to optical colonoscopy, with which she is familiar, she wanted to discuss other strategies such as Cologuard.  REVIEW OF SYSTEMS:  All non-GI ROS negative unless otherwise stated in the HPI except for allergies, anxiety  Past Medical History:  Diagnosis Date  . Acute upper respiratory infections of unspecified site   . Allergy   . ANXIETY 10/12/2008  . Arthritis   . Cancer ([place])    breast  . COPD 03/26/2009  . HERPES ZOSTER 10/12/2008  . Lumbago   . Palpitations   . Syncopal episodes 8/10   "presyncopal" prbly vasovagal-MI ruled out; EKG normal; telemetry with PVC's/PAC's. Stress echo-no evidence for ischemia. EF 65-70%. No valvular abnormalities, no pulm. HTN  . Urinary tract infection, site not specified     Past Surgical History:  Procedure Laterality Date  . OOPHORECTOMY    . ROTATOR CUFF REPAIR Left 2014    Social History [name]  reports that she has quit smoking. She has never used smokeless tobacco. She reports current alcohol use of about 7.0 - 14.0 standard drinks of alcohol per week. She reports that she does not use drugs.  family history includes Alzheimer's disease in her mother; Dementia in her mother; Diabetes in her brother;  Parkinsonism in her father.  Allergies  Allergen Reactions  . Codeine Phosphate   . Ephedrine Hcl   . Penicillins     REACTION: per allergy test       PHYSICAL EXAMINATION: No physical examination with telehealth medicine visit   ASSESSMENT:  1.  Colon cancer screening.  Previous normal colonoscopy 2004.  Patient is an appropriate candidate without contraindication.  PLAN:  1.  I reviewed with her in detail colon cancer screening strategies.  We discussed FIT stool testing (as an annual strategy), Cologuard stool testing (as an every 3-year strategy) and optical colonoscopy.  I discussed with her the sensitivity and specificity of each strategy, cost analysis of each strategy, risks and benefits of each strategy, and the implication of a positive result with either stool based strategy.  To this end she would like to have Cologuard performed as her screening strategy at this time.  We will arrange this through my office.  Contact her with results when available 2.  [name], please arrange for this patient to have Cologuard testing. This telehealth medicine visit was initiated by and consented for by the patient who was in her home while I was in my office during the encounter.  She understands her may be associated professional charge for this service which was 20 minutes in total

## 2019-01-15 ENCOUNTER — Ambulatory Visit
Admission: RE | Admit: 2019-01-15 | Discharge: 2019-01-15 | Disposition: A | Discharge status: Home / Self Care | Payer: PPO | Source: Ambulatory Visit | Attending: Sports Medicine | Admitting: Sports Medicine

## 2019-01-15 ENCOUNTER — Other Ambulatory Visit: Payer: Self-pay

## 2019-01-15 DIAGNOSIS — M5136 Other intervertebral disc degeneration, lumbar region: Secondary | ICD-10-CM | POA: Diagnosis not present

## 2019-01-15 DIAGNOSIS — M545 Low back pain, unspecified: Secondary | ICD-10-CM

## 2019-01-17 DIAGNOSIS — M25551 Pain in right hip: Secondary | ICD-10-CM | POA: Diagnosis not present

## 2019-01-24 DIAGNOSIS — Z1211 Encounter for screening for malignant neoplasm of colon: Secondary | ICD-10-CM | POA: Diagnosis not present

## 2019-01-24 DIAGNOSIS — Z1212 Encounter for screening for malignant neoplasm of rectum: Secondary | ICD-10-CM | POA: Diagnosis not present

## 2019-01-27 DIAGNOSIS — Z853 Personal history of malignant neoplasm of breast: Secondary | ICD-10-CM | POA: Diagnosis not present

## 2019-01-27 DIAGNOSIS — Z1231 Encounter for screening mammogram for malignant neoplasm of breast: Secondary | ICD-10-CM | POA: Diagnosis not present

## 2019-01-27 LAB — HM MAMMOGRAPHY

## 2019-01-28 ENCOUNTER — Other Ambulatory Visit: Payer: Self-pay

## 2019-01-28 LAB — COLOGUARD: Cologuard: NEGATIVE

## 2019-03-25 ENCOUNTER — Other Ambulatory Visit: Payer: Self-pay | Admitting: Family Medicine

## 2019-03-25 NOTE — Telephone Encounter (Signed)
Last filled 09/13/2018 Last OV 12/14/2018  Ok to fill?

## 2019-04-06 DIAGNOSIS — L244 Irritant contact dermatitis due to drugs in contact with skin: Secondary | ICD-10-CM | POA: Diagnosis not present

## 2019-05-04 DIAGNOSIS — M7061 Trochanteric bursitis, right hip: Secondary | ICD-10-CM | POA: Diagnosis not present

## 2019-08-05 ENCOUNTER — Ambulatory Visit: Payer: Medicare Other | Attending: Internal Medicine

## 2019-08-05 DIAGNOSIS — Z23 Encounter for immunization: Secondary | ICD-10-CM | POA: Insufficient documentation

## 2019-08-05 NOTE — Progress Notes (Signed)
   Covid-19 Vaccination Clinic  Name:  [name]    MRN: [id number] DOB: 28-Oct-1945  08/05/2019  [name] was observed post Covid-19 immunization for 15 minutes without incidence. She was provided with Vaccine Information Sheet and instruction to access the V-Safe system.   [name] was instructed to call 911 with any severe reactions post vaccine: Marland Kitchen Difficulty breathing  . Swelling of your face and throat  . A fast heartbeat  . A bad rash all over your body  . Dizziness and weakness    Immunizations Administered    Name Date Dose VIS Date Route   Pfizer COVID-19 Vaccine 08/05/2019 10:07 AM 0.3 mL 07/01/2019 Intramuscular   Manufacturer: [employer], [employer]   Lot: [id number]   [place]: [id number]

## 2019-08-24 ENCOUNTER — Ambulatory Visit: Payer: PPO

## 2019-08-24 ENCOUNTER — Ambulatory Visit: Payer: PPO | Attending: Internal Medicine

## 2019-08-24 DIAGNOSIS — Z23 Encounter for immunization: Secondary | ICD-10-CM

## 2019-08-24 NOTE — Progress Notes (Signed)
   Covid-19 Vaccination Clinic  Name:  [name]    MRN: [id number] DOB: [birth year]  08/24/2019  [name] was observed post Covid-19 immunization for 15 minutes without incidence. She was provided with Vaccine Information Sheet and instruction to access the V-Safe system.   [name] was instructed to call 911 with any severe reactions post vaccine: Marland Kitchen Difficulty breathing  . Swelling of your face and throat  . A fast heartbeat  . A bad rash all over your body  . Dizziness and weakness    Immunizations Administered    Name Date Dose VIS Date Route   Pfizer COVID-19 Vaccine 08/24/2019 10:43 AM 0.3 mL 07/01/2019 Intramuscular   Manufacturer: [place]   Lot: [id number]   [place]: [id number]

## 2019-09-12 ENCOUNTER — Other Ambulatory Visit: Payer: Self-pay

## 2019-09-12 ENCOUNTER — Ambulatory Visit (INDEPENDENT_AMBULATORY_CARE_PROVIDER_SITE_OTHER): Payer: PPO | Admitting: Family Medicine

## 2019-09-12 ENCOUNTER — Encounter: Payer: Self-pay | Admitting: Family Medicine

## 2019-09-12 VITALS — BP 130/64 | HR 73 | Temp 97.4°F | Wt 120.2 lb

## 2019-09-12 DIAGNOSIS — G4700 Insomnia, unspecified: Secondary | ICD-10-CM

## 2019-09-12 DIAGNOSIS — I10 Essential (primary) hypertension: Secondary | ICD-10-CM | POA: Diagnosis not present

## 2019-09-12 MED ORDER — ZOLPIDEM TARTRATE 5 MG PO TABS: ORAL_TABLET | ORAL | 5 refills | Status: DC

## 2019-09-12 NOTE — Progress Notes (Signed)
   Subjective:    Patient ID: [name], female    DOB: 1945/10/30, 74 y.o.   MRN: [id number]  HPI Here to follow up on insomnia. She uses Ambien 5 mg nightly and asks for a refill. She also says she falls asleep initially fairly quickly but often wakes up 3 hours later and cannot go back to sleep. She asks if it would be okay to wait and take the Ambien when she wakes up in the middle of the night rather than at bedtime. Her BP has been stable.    Review of Systems  Constitutional: Negative.   Respiratory: Negative.   Cardiovascular: Negative.   Neurological: Negative.   Psychiatric/Behavioral: Negative.        Objective:   Physical Exam Constitutional:      Appearance: Normal appearance.  Cardiovascular:     Rate and Rhythm: Normal rate and regular rhythm.     Pulses: Normal pulses.     Heart sounds: Normal heart sounds.  Pulmonary:     Effort: Pulmonary effort is normal.     Breath sounds: Normal breath sounds.  Neurological:     General: No focal deficit present.     Mental Status: She is alert and oriented to person, place, and time.           Assessment & Plan:  Her HTN and insomnia are stable. I advised her it would be fine to take the Ambien in the middle of the night as long as she doesn't have to get up early the next morning.  [name], MD

## 2019-09-30 ENCOUNTER — Other Ambulatory Visit: Payer: Self-pay | Admitting: Family Medicine

## 2019-12-16 ENCOUNTER — Encounter: Payer: PPO | Admitting: Family Medicine

## 2019-12-16 ENCOUNTER — Other Ambulatory Visit: Payer: Self-pay

## 2019-12-20 ENCOUNTER — Other Ambulatory Visit: Payer: Self-pay | Admitting: Family Medicine

## 2019-12-20 ENCOUNTER — Encounter: Payer: Self-pay | Admitting: Family Medicine

## 2019-12-20 ENCOUNTER — Ambulatory Visit (INDEPENDENT_AMBULATORY_CARE_PROVIDER_SITE_OTHER): Payer: PPO | Admitting: Family Medicine

## 2019-12-20 ENCOUNTER — Other Ambulatory Visit: Payer: Self-pay

## 2019-12-20 VITALS — BP 130/80 | HR 68 | Temp 97.2°F | Ht 66.0 in | Wt 120.2 lb

## 2019-12-20 DIAGNOSIS — E785 Hyperlipidemia, unspecified: Secondary | ICD-10-CM | POA: Diagnosis not present

## 2019-12-20 DIAGNOSIS — Z0000 Encounter for general adult medical examination without abnormal findings: Secondary | ICD-10-CM

## 2019-12-20 LAB — CBC WITH DIFFERENTIAL/PLATELET
Basophils Absolute: 0.1 10*3/uL (ref 0.0–0.1)
Basophils Relative: 1 % (ref 0.0–3.0)
Eosinophils Absolute: 0 10*3/uL (ref 0.0–0.7)
Eosinophils Relative: 0.6 % (ref 0.0–5.0)
HCT: 39 % (ref 36.0–46.0)
Hemoglobin: 13.2 g/dL (ref 12.0–15.0)
Lymphocytes Relative: 25.5 % (ref 12.0–46.0)
Lymphs Abs: 1.5 10*3/uL (ref 0.7–4.0)
MCHC: 33.9 g/dL (ref 30.0–36.0)
MCV: 99.5 fl (ref 78.0–100.0)
Monocytes Absolute: 0.4 10*3/uL (ref 0.1–1.0)
Monocytes Relative: 7.6 % (ref 3.0–12.0)
Neutro Abs: 3.8 10*3/uL (ref 1.4–7.7)
Neutrophils Relative %: 65.3 % (ref 43.0–77.0)
Platelets: 274 10*3/uL (ref 150.0–400.0)
RBC: 3.92 Mil/uL (ref 3.87–5.11)
RDW: 13 % (ref 11.5–15.5)
WBC: 5.8 10*3/uL (ref 4.0–10.5)

## 2019-12-20 LAB — LIPID PANEL
Cholesterol: 291 mg/dL — ABNORMAL HIGH (ref 0–200)
HDL: 82.1 mg/dL (ref >39.00)
LDL Cholesterol: 194 mg/dL — ABNORMAL HIGH (ref 0–99)
NonHDL: 209.27
Total CHOL/HDL Ratio: 4
Triglycerides: 74 mg/dL (ref 0.0–149.0)
VLDL: 14.8 mg/dL (ref 0.0–40.0)

## 2019-12-20 LAB — BASIC METABOLIC PANEL
BUN: 16 mg/dL (ref 6–23)
CO2: 28 mEq/L (ref 19–32)
Calcium: 9.5 mg/dL (ref 8.4–10.5)
Chloride: 101 mEq/L (ref 96–112)
Creatinine, Ser: 0.71 mg/dL (ref 0.40–1.20)
GFR: 80.39 mL/min (ref >60.00)
Glucose, Bld: 87 mg/dL (ref 70–99)
Potassium: 4.4 mEq/L (ref 3.5–5.1)
Sodium: 137 mEq/L (ref 135–145)

## 2019-12-20 LAB — HEPATIC FUNCTION PANEL
ALT: 13 U/L (ref 0–35)
AST: 23 U/L (ref 0–37)
Albumin: 4.6 g/dL (ref 3.5–5.2)
Alkaline Phosphatase: 83 U/L (ref 39–117)
Bilirubin, Direct: 0.1 mg/dL (ref 0.0–0.3)
Total Bilirubin: 0.6 mg/dL (ref 0.2–1.2)
Total Protein: 7 g/dL (ref 6.0–8.3)

## 2019-12-20 LAB — T4, FREE: Free T4: 0.82 ng/dL (ref 0.60–1.60)

## 2019-12-20 LAB — TSH: TSH: 2.36 u[IU]/mL (ref 0.35–4.50)

## 2019-12-20 LAB — T3, FREE: T3, Free: 3.2 pg/mL (ref 2.3–4.2)

## 2019-12-20 MED ORDER — ALPRAZOLAM 0.5 MG PO TABS: 0.5000 mg | ORAL_TABLET | Freq: Two times a day (BID) | ORAL | 5 refills | Status: DC | PRN

## 2019-12-20 MED ORDER — METOPROLOL TARTRATE 25 MG PO TABS: 12.5000 mg | ORAL_TABLET | Freq: Two times a day (BID) | ORAL | 3 refills | Status: DC

## 2019-12-20 NOTE — Progress Notes (Signed)
   Subjective:    Patient ID: [name], female    DOB: 05/14/1946, 74 y.o.   MRN: [id number]  HPI Here for a well exam. She asks for a refill on Xanax because she has had a lot of stress lately. Her family dog passed away in 11-27-22, and she recently learned that her brother is terminal with idiopathic pulmonary fibrosis. Physically she feels well.    Review of Systems  Constitutional: Negative.   HENT: Negative.   Eyes: Negative.   Respiratory: Negative.   Cardiovascular: Negative.   Gastrointestinal: Negative.   Genitourinary: Negative for decreased urine volume, difficulty urinating, dyspareunia, dysuria, enuresis, flank pain, frequency, hematuria, pelvic pain and urgency.  Musculoskeletal: Negative.   Skin: Negative.   Neurological: Negative.   Psychiatric/Behavioral: Negative.        Objective:   Physical Exam Constitutional:      General: She is not in acute distress.    Appearance: She is well-developed.  HENT:     Head: Normocephalic and atraumatic.     Right Ear: External ear normal.     Left Ear: External ear normal.     Nose: Nose normal.     Mouth/Throat:     Pharynx: No oropharyngeal exudate.  Eyes:     General: No scleral icterus.    Conjunctiva/sclera: Conjunctivae normal.     Pupils: Pupils are equal, round, and reactive to light.  Neck:     Thyroid: No thyromegaly.     Vascular: No JVD.  Cardiovascular:     Rate and Rhythm: Normal rate and regular rhythm.     Heart sounds: Normal heart sounds. No murmur. No friction rub. No gallop.   Pulmonary:     Effort: Pulmonary effort is normal. No respiratory distress.     Breath sounds: Normal breath sounds. No wheezing or rales.  Chest:     Chest wall: No tenderness.  Abdominal:     General: Bowel sounds are normal. There is no distension.     Palpations: Abdomen is soft. There is no mass.     Tenderness: There is no abdominal tenderness. There is no guarding or rebound.  Musculoskeletal:      General: No tenderness. Normal range of motion.     Cervical back: Normal range of motion and neck supple.  Lymphadenopathy:     Cervical: No cervical adenopathy.  Skin:    General: Skin is warm and dry.     Findings: No erythema or rash.  Neurological:     Mental Status: She is alert and oriented to person, place, and time.     Cranial Nerves: No cranial nerve deficit.     Motor: No abnormal muscle tone.     Coordination: Coordination normal.     Deep Tendon Reflexes: Reflexes are normal and symmetric. Reflexes normal.  Psychiatric:        Behavior: Behavior normal.        Thought Content: Thought content normal.        Judgment: Judgment normal.           Assessment & Plan:  Well exam. We discussed diet and exercise. Get fasting labs.  [name], MD

## 2019-12-21 DIAGNOSIS — M7061 Trochanteric bursitis, right hip: Secondary | ICD-10-CM | POA: Diagnosis not present

## 2019-12-22 ENCOUNTER — Ambulatory Visit (INDEPENDENT_AMBULATORY_CARE_PROVIDER_SITE_OTHER)
Admission: RE | Admit: 2019-12-22 | Discharge: 2019-12-22 | Disposition: A | Discharge status: Home / Self Care | Payer: PPO | Source: Ambulatory Visit | Attending: Family Medicine | Admitting: Family Medicine

## 2019-12-22 ENCOUNTER — Other Ambulatory Visit: Payer: Self-pay

## 2019-12-22 DIAGNOSIS — M810 Age-related osteoporosis without current pathological fracture: Secondary | ICD-10-CM | POA: Diagnosis not present

## 2019-12-22 MED ORDER — ATORVASTATIN CALCIUM 10 MG PO TABS
10.0000 mg | ORAL_TABLET | Freq: Every day | ORAL | 3 refills | Status: DC
Start: 2019-12-22 — End: 2020-01-02

## 2019-12-22 NOTE — Addendum Note (Signed)
Addended by: Agnes Lawrence on: 12/22/2019 02:11 PM   Modules accepted: Orders

## 2019-12-30 ENCOUNTER — Telehealth: Payer: Self-pay | Admitting: Family Medicine

## 2019-12-30 ENCOUNTER — Other Ambulatory Visit: Payer: Self-pay

## 2019-12-30 NOTE — Telephone Encounter (Signed)
Left a message for the pt to return my call.  

## 2019-12-30 NOTE — Telephone Encounter (Signed)
Tell her to stop the Lipitor. Yes she can take 1/2 Xanax

## 2019-12-30 NOTE — Telephone Encounter (Signed)
Pt stated she has been on Lipitor for 9 days and has felt off. She decided that she will not take the medication today. Pt explained that her head feels fuzzy/off and complains of chest tightness that comes and goes.   She is wondering if it is okay to take half of the [facility]?  She took half of one on Sunday when going to see her brother and has not take any since then but has been anxious since then.   Transferred pt to Nurse Triage due to the chest tightness. Pt is scheduled for Monday June 14th at 8:45am   Pt can be reached at [phone]

## 2019-12-30 NOTE — Telephone Encounter (Signed)
Patient called back and was informed of the message below.

## 2020-01-02 ENCOUNTER — Ambulatory Visit (INDEPENDENT_AMBULATORY_CARE_PROVIDER_SITE_OTHER): Payer: PPO | Admitting: Family Medicine

## 2020-01-02 ENCOUNTER — Encounter: Payer: Self-pay | Admitting: Family Medicine

## 2020-01-02 ENCOUNTER — Other Ambulatory Visit: Payer: Self-pay

## 2020-01-02 VITALS — BP 140/70 | HR 65 | Temp 97.2°F | Wt 115.6 lb

## 2020-01-02 DIAGNOSIS — E785 Hyperlipidemia, unspecified: Secondary | ICD-10-CM

## 2020-01-02 NOTE — Progress Notes (Signed)
   Subjective:    Patient ID: [name], female    DOB: [birth year], 74 y.o.   MRN: [id number]  HPI Here to follow up on high cholesterol. We recently started her on Lipitor, but she stopped taking this after one week because of leg cramps. She wants to try diet alone if possible.    Review of Systems  Constitutional: Negative.   Respiratory: Negative.   Cardiovascular: Negative.   Musculoskeletal: Positive for myalgias.       Objective:   Physical Exam Constitutional:      Appearance: Normal appearance.  Cardiovascular:     Rate and Rhythm: Normal rate and regular rhythm.     Pulses: Normal pulses.     Heart sounds: Normal heart sounds.  Pulmonary:     Effort: Pulmonary effort is normal.     Breath sounds: Normal breath sounds.  Neurological:     Mental Status: She is alert.           Assessment & Plan:  Dyslipidemia. We agreed to try diet alone, so I referred her to Nutrition to give her some assistance. We will recheck lipids in 90 days. [name], MD

## 2020-02-02 DIAGNOSIS — Z1231 Encounter for screening mammogram for malignant neoplasm of breast: Secondary | ICD-10-CM | POA: Diagnosis not present

## 2020-02-06 ENCOUNTER — Telehealth: Payer: Self-pay | Admitting: Family Medicine

## 2020-02-06 NOTE — Telephone Encounter (Signed)
Left message for patient to schedule Annual Wellness Visit.  Please schedule with Nurse Health Advisor Shannon Crews, RN at [facility]  

## 2020-02-20 NOTE — Addendum Note (Signed)
Addended by: Marrion Coy on: 02/20/2020 04:15 PM   Modules accepted: Orders

## 2020-02-21 ENCOUNTER — Ambulatory Visit: Payer: PPO | Admitting: Dietician

## 2020-03-27 ENCOUNTER — Ambulatory Visit: Payer: Self-pay

## 2020-03-27 ENCOUNTER — Other Ambulatory Visit: Payer: PPO

## 2020-03-27 ENCOUNTER — Other Ambulatory Visit: Payer: Self-pay

## 2020-03-27 DIAGNOSIS — E785 Hyperlipidemia, unspecified: Secondary | ICD-10-CM

## 2020-03-28 LAB — LIPID PANEL
Cholesterol: 265 mg/dL — ABNORMAL HIGH (ref <200)
HDL: 75 mg/dL (ref >=50)
LDL Cholesterol (Calc): 172 mg/dL (calc) — ABNORMAL HIGH
Non-HDL Cholesterol (Calc): 190 mg/dL (calc) — ABNORMAL HIGH (ref <130)
Total CHOL/HDL Ratio: 3.5 (calc) (ref <5.0)
Triglycerides: 78 mg/dL (ref <150)

## 2020-04-05 ENCOUNTER — Other Ambulatory Visit: Payer: Self-pay | Admitting: Family Medicine

## 2020-04-05 NOTE — Telephone Encounter (Signed)
Forwarding to PCP for approval. Next OV 04/16/2020

## 2020-04-06 ENCOUNTER — Ambulatory Visit: Payer: PPO | Admitting: Family Medicine

## 2020-04-09 ENCOUNTER — Encounter (HOSPITAL_COMMUNITY): Payer: Self-pay

## 2020-04-09 ENCOUNTER — Emergency Department (HOSPITAL_COMMUNITY): Payer: PPO

## 2020-04-09 ENCOUNTER — Other Ambulatory Visit: Payer: Self-pay

## 2020-04-09 ENCOUNTER — Emergency Department (HOSPITAL_COMMUNITY)
Admission: EM | Admit: 2020-04-09 | Discharge: 2020-04-09 | Disposition: A | Discharge status: Home / Self Care | Payer: PPO | Attending: Emergency Medicine | Admitting: Emergency Medicine

## 2020-04-09 ENCOUNTER — Telehealth: Payer: Self-pay

## 2020-04-09 DIAGNOSIS — C50919 Malignant neoplasm of unspecified site of unspecified female breast: Secondary | ICD-10-CM | POA: Diagnosis not present

## 2020-04-09 DIAGNOSIS — R42 Dizziness and giddiness: Secondary | ICD-10-CM | POA: Diagnosis not present

## 2020-04-09 DIAGNOSIS — J449 Chronic obstructive pulmonary disease, unspecified: Secondary | ICD-10-CM | POA: Diagnosis not present

## 2020-04-09 DIAGNOSIS — Z79899 Other long term (current) drug therapy: Secondary | ICD-10-CM | POA: Diagnosis not present

## 2020-04-09 DIAGNOSIS — K219 Gastro-esophageal reflux disease without esophagitis: Secondary | ICD-10-CM | POA: Insufficient documentation

## 2020-04-09 DIAGNOSIS — N854 Malposition of uterus: Secondary | ICD-10-CM | POA: Diagnosis not present

## 2020-04-09 DIAGNOSIS — Z20822 Contact with and (suspected) exposure to covid-19: Secondary | ICD-10-CM | POA: Insufficient documentation

## 2020-04-09 DIAGNOSIS — I700 Atherosclerosis of aorta: Secondary | ICD-10-CM | POA: Diagnosis not present

## 2020-04-09 DIAGNOSIS — R1013 Epigastric pain: Secondary | ICD-10-CM | POA: Diagnosis present

## 2020-04-09 DIAGNOSIS — Z87891 Personal history of nicotine dependence: Secondary | ICD-10-CM | POA: Diagnosis not present

## 2020-04-09 DIAGNOSIS — R109 Unspecified abdominal pain: Secondary | ICD-10-CM | POA: Diagnosis not present

## 2020-04-09 DIAGNOSIS — I10 Essential (primary) hypertension: Secondary | ICD-10-CM | POA: Insufficient documentation

## 2020-04-09 DIAGNOSIS — R110 Nausea: Secondary | ICD-10-CM | POA: Diagnosis not present

## 2020-04-09 DIAGNOSIS — R9431 Abnormal electrocardiogram [ECG] [EKG]: Secondary | ICD-10-CM | POA: Diagnosis not present

## 2020-04-09 LAB — COMPREHENSIVE METABOLIC PANEL
ALT: 15 U/L (ref 0–44)
AST: 24 U/L (ref 15–41)
Albumin: 4.4 g/dL (ref 3.5–5.0)
Alkaline Phosphatase: 67 U/L (ref 38–126)
Anion gap: 10 (ref 5–15)
BUN: 13 mg/dL (ref 8–23)
CO2: 24 mmol/L (ref 22–32)
Calcium: 9.3 mg/dL (ref 8.9–10.3)
Chloride: 102 mmol/L (ref 98–111)
Creatinine, Ser: 0.7 mg/dL (ref 0.44–1.00)
GFR calc Af Amer: >60 mL/min (ref >60)
GFR calc non Af Amer: >60 mL/min (ref >60)
Glucose, Bld: 114 mg/dL — ABNORMAL HIGH (ref 70–99)
Potassium: 4.5 mmol/L (ref 3.5–5.1)
Sodium: 136 mmol/L (ref 135–145)
Total Bilirubin: 0.9 mg/dL (ref 0.3–1.2)
Total Protein: 7.4 g/dL (ref 6.5–8.1)

## 2020-04-09 LAB — URINALYSIS, ROUTINE W REFLEX MICROSCOPIC
Bilirubin Urine: NEGATIVE
Glucose, UA: NEGATIVE mg/dL
Hgb urine dipstick: NEGATIVE
Ketones, ur: 20 mg/dL — AB
Leukocytes,Ua: NEGATIVE
Nitrite: NEGATIVE
Protein, ur: NEGATIVE mg/dL
Specific Gravity, Urine: 1.016 (ref 1.005–1.030)
pH: 7 (ref 5.0–8.0)

## 2020-04-09 LAB — CBC
HCT: 38.1 % (ref 36.0–46.0)
Hemoglobin: 13.1 g/dL (ref 12.0–15.0)
MCH: 33.9 pg (ref 26.0–34.0)
MCHC: 34.4 g/dL (ref 30.0–36.0)
MCV: 98.7 fL (ref 80.0–100.0)
Platelets: 207 10*3/uL (ref 150–400)
RBC: 3.86 MIL/uL — ABNORMAL LOW (ref 3.87–5.11)
RDW: 11.9 % (ref 11.5–15.5)
WBC: 7.2 10*3/uL (ref 4.0–10.5)
nRBC: 0 % (ref 0.0–0.2)

## 2020-04-09 LAB — LIPASE, BLOOD: Lipase: 29 U/L (ref 11–51)

## 2020-04-09 LAB — SARS CORONAVIRUS 2 BY RT PCR (HOSPITAL ORDER, PERFORMED IN ~~LOC~~ HOSPITAL LAB): SARS Coronavirus 2: NEGATIVE

## 2020-04-09 MED ORDER — ONDANSETRON 4 MG PO TBDP: 4.0000 mg | ORAL_TABLET | Freq: Three times a day (TID) | ORAL | 0 refills | Status: DC | PRN

## 2020-04-09 MED ORDER — SODIUM CHLORIDE 0.9 % IV BOLUS
500.0000 mL | Freq: Once | INTRAVENOUS | Status: AC
  Administered 2020-04-09: 500 mL via INTRAVENOUS

## 2020-04-09 MED ORDER — IOHEXOL 300 MG/ML  SOLN
100.0000 mL | Freq: Once | INTRAMUSCULAR | Status: AC | PRN
  Administered 2020-04-09: 100 mL via INTRAVENOUS

## 2020-04-09 NOTE — Discharge Instructions (Signed)
Please return for any problem. Follow-up with Dr. Henrene Pastor of GI as previously arranged.  Use Zofran as necessary for nausea. Continue use of famotidine. Drink plenty of fluids and avoid fatty, fried foods.   Of note, your COVID test today is negative.

## 2020-04-09 NOTE — ED Triage Notes (Addendum)
Patient c/o abdominal pain,nausea, indigestion, and anxious. Patient states, "I feel like I am in a panic." patient stated, "My stomach is making a lot of noise." Patient also reports that she is in the process of getting a gastro appointment today.

## 2020-04-09 NOTE — ED Provider Notes (Signed)
[name] DEPT Provider Note   CSN: [id number] Arrival date & time: 04/09/20  0945     History Chief Complaint  Patient presents with  . Abdominal Pain  . Dizziness  . Nausea    [name] is a 74 y.o. female.  74 year old female with prior medical history as detailed below presents for evaluation. Patient reports intermittent epigastric discomfort. This is been ongoing for the last 1 to 2 weeks. She reports this discomfort is associated with a bitter taste in her mouth. She started taking famotidine at home with minimal improvement. Symptoms were worse this morning. She decided to come to the ED for evaluation.  Of note, patient is known to Dr. Henrene Pastor of GI. She has an appointment with Dr. Henrene Pastor for later this week for evaluation of the same complaint.  The history is provided by the patient and medical records.  Abdominal Pain Pain location:  Epigastric Pain quality: aching   Pain radiates to:  Does not radiate Pain severity:  No pain Onset quality:  Gradual Duration:  3 weeks Timing:  Constant Progression:  Waxing and waning Chronicity:  New Relieved by:  Antacids Worsened by:  Nothing Dizziness      Past Medical History:  Diagnosis Date  . Acute upper respiratory infections of unspecified site   . Allergy   . ANXIETY 10/12/2008  . Arthritis   . Cancer ([facility])    breast  . COPD 03/26/2009  . HERPES ZOSTER 10/12/2008  . Lumbago   . Palpitations   . Syncopal episodes 8/10   "presyncopal" prbly vasovagal-MI ruled out; EKG normal; telemetry with PVC's/PAC's. Stress echo-no evidence for ischemia. EF 65-70%. No valvular abnormalities, no pulm. HTN  . Urinary tract infection, site not specified     Patient Active Problem List   Diagnosis Date Noted  . Dyslipidemia 01/02/2020  . HTN (hypertension) 09/12/2019  . Insomnia 09/13/2018  . COPD, mild ([place]) 03/26/2009  . HERPES ZOSTER 10/12/2008  . Anxiety state 10/12/2008    Past  Surgical History:  Procedure Laterality Date  . COLONOSCOPY  04/20/2003   per Dr. Henrene Pastor, clear. Cologuard 01-24-19 negative (repeat in 3 yrs)   . OOPHORECTOMY    . ROTATOR CUFF REPAIR Left 2014     OB History   No obstetric history on file.     Family History  Problem Relation Age of Onset  . Dementia Mother   . Alzheimer's disease Mother   . Parkinsonism Father   . Diabetes Brother   . Colon polyps Neg Hx   . Esophageal cancer Neg Hx   . Rectal cancer Neg Hx   . Stomach cancer Neg Hx     Social History   Tobacco Use  . Smoking status: Former Research scientist (life sciences)  . Smokeless tobacco: Never Used  Vaping Use  . Vaping Use: Never used  Substance Use Topics  . Alcohol use: Yes    Alcohol/week: 7.0 - 14.0 standard drinks    Types: 7 - 14 Glasses of wine per week    Comment: wodka tonic every night, sometimes 2.   . Drug use: No    Home Medications Prior to Admission medications   Medication Sig Start Date End Date Taking? Authorizing Provider  ALPRAZolam Duanne Moron) 0.5 MG tablet Take 1 tablet (0.5 mg total) by mouth 2 (two) times daily as needed for anxiety or sleep. 12/20/19   Laurey Morale, MD  cetirizine (ZYRTEC) 10 MG tablet Take 10 mg by mouth daily.  [provider]  ibuprofen (ADVIL,MOTRIN) 100 MG/5ML suspension Take 200 mg by mouth every 4 (four) hours as needed.    [provider]  metoprolol tartrate (LOPRESSOR) 25 MG tablet TAKE 1 TABLET(25 MG) BY MOUTH TWICE DAILY 12/20/19   Laurey Morale, MD  ondansetron (ZOFRAN-ODT) 4 MG disintegrating tablet Take 1 tablet (4 mg total) by mouth every 8 (eight) hours as needed for nausea or vomiting. 04/09/20   Valarie Merino, MD  zolpidem (AMBIEN) 5 MG tablet TAKE 1 TABLET(5 MG) BY MOUTH AT BEDTIME AS NEEDED FOR SLEEP 04/06/20   Laurey Morale, MD    Allergies    Codeine phosphate, Ephedrine hcl, Lipitor [atorvastatin], and Penicillins  Review of Systems   Review of Systems  Gastrointestinal: Positive for abdominal  pain.  Neurological: Positive for dizziness.  All other systems reviewed and are negative.   Physical Exam Updated Vital Signs BP (!) 196/156 (BP Location: Left Arm)   Pulse 75   Temp 97.9 F (36.6 C) (Oral)   Resp 18   Ht 5\' 7"  (1.702 m)   Wt 53.5 kg   SpO2 100%   BMI 18.48 kg/m   Physical Exam Vitals and nursing note reviewed.  Constitutional:      General: She is not in acute distress.    Appearance: She is well-developed.  HENT:     Head: Normocephalic and atraumatic.  Eyes:     Conjunctiva/sclera: Conjunctivae normal.     Pupils: Pupils are equal, round, and reactive to light.  Cardiovascular:     Rate and Rhythm: Normal rate and regular rhythm.     Heart sounds: Normal heart sounds.  Pulmonary:     Effort: Pulmonary effort is normal. No respiratory distress.     Breath sounds: Normal breath sounds.  Abdominal:     General: There is no distension.     Palpations: Abdomen is soft.     Tenderness: There is no abdominal tenderness.  Musculoskeletal:        General: No deformity. Normal range of motion.     Cervical back: Normal range of motion and neck supple.  Skin:    General: Skin is warm and dry.  Neurological:     Mental Status: She is alert and oriented to person, place, and time.     ED Results / Procedures / Treatments   Labs (all labs ordered are listed, but only abnormal results are displayed) Labs Reviewed  COMPREHENSIVE METABOLIC PANEL - Abnormal; Notable for the following components:      Result Value   Glucose, Bld 114 (*)    All other components within normal limits  CBC - Abnormal; Notable for the following components:   RBC 3.86 (*)    All other components within normal limits  URINALYSIS, ROUTINE W REFLEX MICROSCOPIC - Abnormal; Notable for the following components:   Ketones, ur 20 (*)    All other components within normal limits  SARS CORONAVIRUS 2 BY RT PCR [facility] ORDER, [facility])  LIPASE, BLOOD     EKG EKG Interpretation  Date/Time:  Monday April 09 2020 12:05:26 EDT Ventricular Rate:  66 PR Interval:    QRS Duration: 85 QT Interval:  398 QTC Calculation: 417 R Axis:   66 Text Interpretation: Sinus rhythm Atrial premature complexes RSR' in V1 or V2, probably normal variant ST depr, consider ischemia, inferior leads Minimal ST elevation, lateral leads Confirmed by Dene Gentry [phone]) on 04/09/2020 12:07:36 PM  Radiology CT ABDOMEN PELVIS W CONTRAST  Result Date: 04/09/2020 CLINICAL DATA:  Abdominal pain, nausea EXAM: CT ABDOMEN AND PELVIS WITH CONTRAST TECHNIQUE: Multidetector CT imaging of the abdomen and pelvis was performed using the standard protocol following bolus administration of intravenous contrast. CONTRAST:  176mL OMNIPAQUE IOHEXOL 300 MG/ML  SOLN COMPARISON:  None. FINDINGS: Lower chest: No acute abnormality. Hepatobiliary: No focal liver abnormality is seen. No gallstones, gallbladder wall thickening, or biliary dilatation. Pancreas: Unremarkable. No pancreatic ductal dilatation or surrounding inflammatory changes. Spleen: Normal in size without focal abnormality. Adrenals/Urinary Tract: Unremarkable adrenal glands. Kidneys enhance symmetrically without focal lesion, stone, or hydronephrosis. Ureters are nondilated. Urinary bladder appears unremarkable. Stomach/Bowel: Stomach is within normal limits. Appendix not definitively visualized. No evidence of bowel wall thickening, distention, or inflammatory changes. Vascular/Lymphatic: Scattered aortoiliac atherosclerosis. Circumaortic left renal vein, an anatomic variant. No enlarged abdominal or pelvic lymph nodes. Reproductive: Retroverted slightly atrophic uterus. Mild prominence of the endometrial stripe without focal lesion. Postsurgical changes in the region of the right adnexa. No adnexal masses. Other: No free fluid. No abdominopelvic fluid collection. No pneumoperitoneum. No abdominal wall hernia. Musculoskeletal:  No acute or significant osseous findings. IMPRESSION: 1. No acute abdominopelvic findings. 2. Prominent appearance of the endometrial stripe which could reflect underlying endometrial hyperplasia. Consider pelvic ultrasound for further evaluation. 3. Aortic atherosclerosis. (ICD10-I70.0). Electronically Signed   By: Davina Poke D.O.   On: 04/09/2020 12:45    Procedures Procedures (including critical care time)  Medications Ordered in ED Medications  sodium chloride 0.9 % bolus 500 mL (500 mLs Intravenous New Bag/Given 04/09/20 1203)  iohexol (OMNIPAQUE) 300 MG/ML solution 100 mL (100 mLs Intravenous Contrast Given 04/09/20 1221)    ED Course  I have reviewed the triage vital signs and the nursing notes.  Pertinent labs & imaging results that were available during my care of the patient were reviewed by me and considered in my medical decision making (see chart for details).    MDM Rules/Calculators/A&P                          MDM  Screen complete  [name] was evaluated in Emergency Department on 04/09/2020 for the symptoms described in the history of present illness. She was evaluated in the context of the global COVID-19 pandemic, which necessitated consideration that the patient might be at risk for infection with the SARS-CoV-2 virus that causes COVID-19. Institutional protocols and algorithms that pertain to the evaluation of patients at risk for COVID-19 are in a state of rapid change based on information released by regulatory bodies including the CDC and federal and state organizations. These policies and algorithms were followed during the patient's care in the ED.   Patient is presenting for evaluation of symptoms most suggestive of GERD.  Patient's work-up in the ED is without significant acute findings. Patient does feel improved. She desires discharge. She does have already established follow-up with GI later this week.  Patient requests nausea medication for  possible home use. She desires to continue to use her famotidine until she can be seen by Dr. Henrene Pastor later this week.  Importance of close follow-up is stressed. Strict return precautions given and understood.  Final Clinical Impression(s) / ED Diagnoses Final diagnoses:  Gastroesophageal reflux disease, unspecified whether esophagitis present    Rx / DC Orders ED Discharge Orders         Ordered    ondansetron (ZOFRAN-ODT) 4 MG disintegrating tablet  Every 8 hours PRN        04/09/20 1328           Valarie Merino, MD 04/09/20 1335

## 2020-04-09 NOTE — Telephone Encounter (Signed)
Pt called with abdominal complaints and spoke with Dr. Henrene Pastor. Per Dr. Henrene Pastor pt to be scheduled 04/11/20@11 :40am. Pt aware of appt.

## 2020-04-10 ENCOUNTER — Telehealth: Payer: Self-pay

## 2020-04-10 NOTE — Telephone Encounter (Signed)
Pt stated to radiology that the last ct she had the contrast made her feel like her tongue was thick. Per Dr. Norman Herrlich pt to do 13 hour prep. Scripts for prednisone and benadryl sent to pharmacy and pt aware.

## 2020-04-11 ENCOUNTER — Ambulatory Visit: Payer: PPO | Admitting: Internal Medicine

## 2020-04-11 ENCOUNTER — Encounter: Payer: Self-pay | Admitting: Internal Medicine

## 2020-04-11 VITALS — BP 118/70 | HR 71 | Ht 66.0 in | Wt 118.8 lb

## 2020-04-11 DIAGNOSIS — R1013 Epigastric pain: Secondary | ICD-10-CM

## 2020-04-11 DIAGNOSIS — R438 Other disturbances of smell and taste: Secondary | ICD-10-CM | POA: Diagnosis not present

## 2020-04-11 DIAGNOSIS — F411 Generalized anxiety disorder: Secondary | ICD-10-CM

## 2020-04-11 MED ORDER — PANTOPRAZOLE SODIUM 40 MG PO TBEC
40.0000 mg | DELAYED_RELEASE_TABLET | Freq: Every day | ORAL | 3 refills | Status: DC
Start: 2020-04-11 — End: 2021-04-01

## 2020-04-11 NOTE — Patient Instructions (Signed)
We have sent the following medications to your pharmacy for you to pick up at your convenience:  You have been scheduled for an endoscopy. Please follow written instructions given to you at your visit today. If you use inhalers (even only as needed), please bring them with you on the day of your procedure.

## 2020-04-11 NOTE — Progress Notes (Addendum)
HISTORY OF PRESENT ILLNESS:  [name] is a 74 y.o. female with a history of breast cancer and anxiety who is worked into today's office schedule with complaints of dyspepsia and bad taste in her mouth.  She is accompanied by her husband Al.  Patient was last seen in this office virtually January 11, 2019 regarding colon cancer screening.  After detailed and balanced discussion she elected for Cologuard testing-which returned negative or normal.  She was well until about 5 months ago when she suffered a series of tragedies including the death of her longtime canine companion, her younger brother, and then, in August, a close friend.  She is tearful when describing the series of unfortunate events.  She has been having progressive difficulty sleeping and anhedonia.  Typically likes to work in her yard and get out and about, but has not.  Over the past 3 weeks she describes sensation of indigestion and a bitter taste in her mouth.  She feels like her life is falling apart.  Symptoms seem to worsen as the day goes on.  She will occasionally have sharp discomfort running from the chest to the lower abdomen in the midline.  Typically last 2 or 3 minutes.  She does not have classic reflux symptoms or dysphagia.  She has noticed increased gurgling in her abdomen.  No change in bowel habits.  Some nausea but no vomiting.  2 days ago she developed nausea and dizziness.  She was evaluated in the emergency room.  Myriad of laboratories including comprehensive metabolic panel, CBC, and urinalysis were unremarkable.  Covid testing was negative.  CT scan of the abdomen and pelvis was also unremarkable.  She was noted to have a prominent endometrial stripe.  She was told that she had acid reflux and to see her neurologist as scheduled.  She has been trying famotidine frequently.  Possibly helps.  She takes Ambien 5 mg at night.  She sparingly uses alprazolam (0.25 mg 2 or 3 times per month).  She has completed her Covid  vaccination series.  She did undergo complete colonoscopy 2004.  This was normal  REVIEW OF SYSTEMS:  All non-GI ROS negative unless otherwise stated in the HPI except for anxiety, depression, sleeping problems  Past Medical History:  Diagnosis Date  . Acute upper respiratory infections of unspecified site   . Allergy   . ANXIETY 10/12/2008  . Arthritis   . Cancer ([place])    breast  . COPD 03/26/2009  . HERPES ZOSTER 10/12/2008  . Lumbago   . Palpitations   . Syncopal episodes 8/10   "presyncopal" prbly vasovagal-MI ruled out; EKG normal; telemetry with PVC's/PAC's. Stress echo-no evidence for ischemia. EF 65-70%. No valvular abnormalities, no pulm. HTN  . Urinary tract infection, site not specified     Past Surgical History:  Procedure Laterality Date  . COLONOSCOPY  04/20/2003   per Dr. Henrene Pastor, clear. Cologuard 01-24-19 negative (repeat in 3 yrs)   . OOPHORECTOMY    . ROTATOR CUFF REPAIR Left 2014    Social History MONIKA CHESTANG  reports that she has quit smoking. She has never used smokeless tobacco. She reports current alcohol use of about 7.0 - 14.0 standard drinks of alcohol per week. She reports that she does not use drugs.  family history includes Alzheimer's disease in her mother; Dementia in her mother; Diabetes in her brother; Parkinsonism in her father.  Allergies  Allergen Reactions  . Codeine Phosphate   . Ephedrine Hcl   .  Lipitor [Atorvastatin]     Muscle cramps   . Penicillins     REACTION: per allergy test       PHYSICAL EXAMINATION: Vital signs: BP 118/70   Pulse 71   Ht '5\' 6"'  (1.676 m)   Wt 118 lb 12.8 oz (53.9 kg)   BMI 19.17 kg/m   Constitutional: generally well-appearing, no acute distress Psychiatric: alert and oriented x3, cooperative Eyes: extraocular movements intact, anicteric, conjunctiva pink Mouth: oral pharynx moist, no lesions Neck: supple no lymphadenopathy Cardiovascular: heart regular rate and rhythm, no murmur Lungs: clear to  auscultation bilaterally Abdomen: soft, nontender, nondistended, no obvious ascites, no peritoneal signs, normal bowel sounds, no organomegaly Rectal: Omitted Extremities: no clubbing, cyanosis, or lower extremity edema bilaterally Skin: no lesions on visible extremities Neuro: No focal deficits.  Cranial nerves intact  ASSESSMENT:  1.  Dyspepsia.  I suspect this is functional and related to anxiety/depression.  Associated health related anxiety 2.  Anxiety/depression.  Exacerbated by multiple situational death events.  Ongoing.  Untreated 3.  Normal colonoscopy in 2004.  Negative Cologuard testing last year   PLAN:  1.  Prescribe pantoprazole 40 mg daily for dyspepsia.  Medication risks reviewed 2.  Schedule upper endoscopy.The nature of the procedure, as well as the risks, benefits, and alternatives were carefully and thoroughly reviewed with the patient. Ample time for discussion and questions allowed. The patient understood, was satisfied, and agreed to proceed. 3.  If endoscopy unrevealing, prescribe Zoloft 25 mg at night.  Titrate over time and arrange follow-up.  A total time of 40 minutes was spent preparing to see the patient, reviewing outside test, x-rays, emergency room evaluation.  Also, obtaining comprehensive history and performing comprehensive physical exam.  In addition, counseling and educating the patient and her husband regarding her above listed issues.  Ordering medications and advanced endoscopic procedures.  Finally, documenting clinical information in the health record.

## 2020-04-12 ENCOUNTER — Other Ambulatory Visit: Payer: Self-pay

## 2020-04-12 ENCOUNTER — Ambulatory Visit (AMBULATORY_SURGERY_CENTER): Payer: PPO | Admitting: Internal Medicine

## 2020-04-12 ENCOUNTER — Encounter: Payer: Self-pay | Admitting: Internal Medicine

## 2020-04-12 VITALS — BP 133/62 | HR 63 | Temp 96.8°F | Resp 13 | Ht 66.0 in | Wt 118.0 lb

## 2020-04-12 DIAGNOSIS — K222 Esophageal obstruction: Secondary | ICD-10-CM

## 2020-04-12 DIAGNOSIS — K449 Diaphragmatic hernia without obstruction or gangrene: Secondary | ICD-10-CM | POA: Diagnosis not present

## 2020-04-12 DIAGNOSIS — I10 Essential (primary) hypertension: Secondary | ICD-10-CM | POA: Diagnosis not present

## 2020-04-12 DIAGNOSIS — R1013 Epigastric pain: Secondary | ICD-10-CM | POA: Diagnosis not present

## 2020-04-12 DIAGNOSIS — J449 Chronic obstructive pulmonary disease, unspecified: Secondary | ICD-10-CM | POA: Diagnosis not present

## 2020-04-12 DIAGNOSIS — K219 Gastro-esophageal reflux disease without esophagitis: Secondary | ICD-10-CM | POA: Diagnosis not present

## 2020-04-12 MED ORDER — SODIUM CHLORIDE 0.9 % IV SOLN
500.0000 mL | Freq: Once | INTRAVENOUS | Status: DC
Start: 2020-04-12 — End: 2020-04-12

## 2020-04-12 MED ORDER — SERTRALINE HCL 25 MG PO TABS: ORAL_TABLET | ORAL | 3 refills | Status: DC

## 2020-04-12 NOTE — Op Note (Signed)
[place] Patient Name: [name] Procedure Date: 04/12/2020 10:28 AM MRN: [id number] Endoscopist: Docia Chuck. Henrene Pastor , MD Age: 74 Referring MD:  Date of Birth: [birth year] Gender: Female Account #: [id number] Procedure:                Upper GI endoscopy Indications:              Dyspepsia Medicines:                Monitored Anesthesia Care Procedure:                Pre-Anesthesia Assessment:                           - Prior to the procedure, a History and Physical                            was performed, and patient medications and                            allergies were reviewed. The patient's tolerance of                            previous anesthesia was also reviewed. The risks                            and benefits of the procedure and the sedation                            options and risks were discussed with the patient.                            All questions were answered, and informed consent                            was obtained. Prior Anticoagulants: The patient has                            taken no previous anticoagulant or antiplatelet                            agents. ASA Grade Assessment: II - A patient with                            mild systemic disease. After reviewing the risks                            and benefits, the patient was deemed in                            satisfactory condition to undergo the procedure.                           After obtaining informed consent, the endoscope was  passed under direct vision. Throughout the                            procedure, the patient's blood pressure, pulse, and                            oxygen saturations were monitored continuously. The                            Endoscope was introduced through the mouth, and                            advanced to the third part of duodenum. The upper                            GI endoscopy was accomplished without  difficulty.                            The patient tolerated the procedure well. Scope In: Scope Out: Findings:                 The esophagus revealed a large caliber benign                            distal esophageal ring. The esophagus was otherwise                            normal.                           The stomach was normal, save small hiatal hernia.                           The examined duodenum was normal.                           The cardia and gastric fundus were normal on                            retroflexion. Complications:            No immediate complications. Estimated Blood Loss:     Estimated blood loss: none. Impression:               1. Benign esophageal ring                           2. Otherwise normal EGD. Recommendation:           1. Patient has a contact number available for                            emergencies. The signs and symptoms of potential                            delayed complications were discussed with the  patient. Return to normal activities tomorrow.                            Written discharge instructions were provided to the                            patient.                           2. Resume previous diet.                           3. Continue present medications.                           4. Prescribe Zoloft (generic equivalent) 25 mg at                            night; #30; 3 refills                           5. Office follow-up with Dr. Henrene Pastor in 4 weeks Docia Chuck. Henrene Pastor, MD 04/12/2020 10:45:00 AM This report has been signed electronically.

## 2020-04-12 NOTE — Progress Notes (Signed)
VS- Christell Constant RN

## 2020-04-12 NOTE — Patient Instructions (Addendum)
Zoloft Rx to your pharmacy  Office follow up with Dr Henrene Pastor in 4 weeks- make this appointment   YOU HAD AN ENDOSCOPIC PROCEDURE TODAY AT [facility]:   Refer to the procedure report that was given to you for any specific questions about what was found during the examination.  If the procedure report does not answer your questions, please call your gastroenterologist to clarify.  If you requested that your care partner not be given the details of your procedure findings, then the procedure report has been included in a sealed envelope for you to review at your convenience later.  YOU SHOULD EXPECT: Some feelings of bloating in the abdomen. Passage of more gas than usual.  Walking can help get rid of the air that was put into your GI tract during the procedure and reduce the bloating. If you had a lower endoscopy (such as a colonoscopy or flexible sigmoidoscopy) you may notice spotting of blood in your stool or on the toilet paper. If you underwent a bowel prep for your procedure, you may not have a normal bowel movement for a few days.  Please Note:  You might notice some irritation and congestion in your nose or some drainage.  This is from the oxygen used during your procedure.  There is no need for concern and it should clear up in a day or so.  SYMPTOMS TO REPORT IMMEDIATELY:     Following upper endoscopy (EGD)  Vomiting of blood or coffee ground material  New chest pain or pain under the shoulder blades  Painful or persistently difficult swallowing  New shortness of breath  Fever of 100F or higher  Black, tarry-looking stools  For urgent or emergent issues, a gastroenterologist can be reached at any hour by calling [phone]. Do not use MyChart messaging for urgent concerns.    DIET:  We do recommend a small meal at first, but then you may proceed to your regular diet.  Drink plenty of fluids but you should avoid alcoholic beverages for 24 hours.  ACTIVITY:   You should plan to take it easy for the rest of today and you should NOT DRIVE or use heavy machinery until tomorrow (because of the sedation medicines used during the test).    FOLLOW UP: Our staff will call the number listed on your records 48-72 hours following your procedure to check on you and address any questions or concerns that you may have regarding the information given to you following your procedure. If we do not reach you, we will leave a message.  We will attempt to reach you two times.  During this call, we will ask if you have developed any symptoms of COVID 19. If you develop any symptoms (ie: fever, flu-like symptoms, shortness of breath, cough etc.) before then, please call [phone].  If you test positive for Covid 19 in the 2 weeks post procedure, please call and report this information to [place].    If any biopsies were taken you will be contacted by phone or by letter within the next 1-3 weeks.  Please call us at [phone] if you have not heard about the biopsies in 3 weeks.    SIGNATURES/CONFIDENTIALITY: You and/or your care partner have signed paperwork which will be entered into your electronic medical record.  These signatures attest to the fact that that the information above on your After Visit Summary has been reviewed and is understood.  Full responsibility of the confidentiality of  this discharge information lies with you and/or your care-partner.

## 2020-04-12 NOTE — Progress Notes (Signed)
Report to PACU, RN, vss, BBS= Clear.  

## 2020-04-13 ENCOUNTER — Other Ambulatory Visit: Payer: Self-pay

## 2020-04-16 ENCOUNTER — Ambulatory Visit (INDEPENDENT_AMBULATORY_CARE_PROVIDER_SITE_OTHER): Payer: PPO | Admitting: Family Medicine

## 2020-04-16 ENCOUNTER — Telehealth: Payer: Self-pay | Admitting: *Deleted

## 2020-04-16 ENCOUNTER — Encounter: Payer: Self-pay | Admitting: Family Medicine

## 2020-04-16 ENCOUNTER — Other Ambulatory Visit: Payer: Self-pay

## 2020-04-16 VITALS — BP 160/80 | HR 85 | Temp 98.2°F | Ht 66.0 in | Wt 118.2 lb

## 2020-04-16 DIAGNOSIS — R739 Hyperglycemia, unspecified: Secondary | ICD-10-CM

## 2020-04-16 DIAGNOSIS — E785 Hyperlipidemia, unspecified: Secondary | ICD-10-CM | POA: Diagnosis not present

## 2020-04-16 DIAGNOSIS — F411 Generalized anxiety disorder: Secondary | ICD-10-CM | POA: Diagnosis not present

## 2020-04-16 NOTE — Telephone Encounter (Signed)
  Follow up Call-  Call back number 04/12/2020  Post procedure Call Back phone  # [phone]  Permission to leave phone message Yes  Some recent data might be hidden     Patient questions:  Do you have a fever, pain , or abdominal swelling? No. Pain Score  0 *  Have you tolerated food without any problems? Yes.    Have you been able to return to your normal activities? Yes.    Do you have any questions about your discharge instructions: Diet   No. Medications  No. Follow up visit  No.  Do you have questions or concerns about your Care? No.  Actions: * If pain score is 4 or above: No action needed, pain <4  1. Have you developed a fever since your procedure? NO  2.   Have you had an respiratory symptoms (SOB or cough) since your procedure? NO  3.   Have you tested positive for COVID 19 since your procedure NO  4.   Have you had any family members/close contacts diagnosed with the COVID 19 since your procedure?  NO   If yes to any of these questions please route to Joylene John, RN and Joella Prince, RN

## 2020-04-16 NOTE — Progress Notes (Signed)
   Subjective:    Patient ID: [name], female    DOB: 01/24/1946, 74 y.o.   MRN: [id number]  HPI Here with her husband to discuss several issues. First she is concerned about her high cholesterol. Her recent LDL was 172, even though she eats a very healthy diet. She realizes that there is likely a strong genetic component to this. She tried Lipitor some years ago but it caused muscle pains so she stopped it. She would be up for trying a different statin however. Also she has been seeing Dr. Henrene Pastor for multiple abdominal complaints including pain and bloating. He started her on Pantoprazole, and this has helped a bit. She underwent an upper endoscopy which was unrevealing. He then felt that a lot of her problems were caused by stress, and she agrees. She admits that she has been very stressed about some family members and friends who have medical issues, and she worries about her own health. Dr. Henrene Pastor started her on Zoloft 25 mg daily, but she has only taken this for less than a week, so she cannot really tell if it will help. She is also worried that she may be developing diabetes. A recent glucose was 114, but this was not fasting.    Review of Systems  Constitutional: Negative.   Respiratory: Negative.   Cardiovascular: Negative.   Gastrointestinal: Positive for abdominal distention, abdominal pain and constipation. Negative for blood in stool, nausea and vomiting.  Psychiatric/Behavioral: Negative for dysphoric mood. The patient is nervous/anxious.        Objective:   Physical Exam Constitutional:      Appearance: Normal appearance.  Cardiovascular:     Rate and Rhythm: Normal rate and regular rhythm.     Pulses: Normal pulses.     Heart sounds: Normal heart sounds.  Pulmonary:     Effort: Pulmonary effort is normal.     Breath sounds: Normal breath sounds.  Neurological:     Mental Status: She is alert.  Psychiatric:        Behavior: Behavior normal.        Thought Content:  Thought content normal.        Judgment: Judgment normal.     Comments: Anxious, almost tearful           Assessment & Plan:  I agree that most of her GI issues are related to anxiety, and I think Zoloft will be helpful for her. I asked her to finish taking 25 mg daily for one week and then to increase this to 50 mg daily. We may need to titrate this up further over the naxt few weeks. As for her high cholesterol, I suggested she try Crestor, and she agreed. We will wait a bit for her GO issues to settle down first. We will send her for an A1c today to address her concerns about possible diabetes. [name], MD

## 2020-04-17 DIAGNOSIS — M7061 Trochanteric bursitis, right hip: Secondary | ICD-10-CM | POA: Diagnosis not present

## 2020-04-17 LAB — HEMOGLOBIN A1C
Hgb A1c MFr Bld: 5.4 % of total Hgb (ref <5.7)
Mean Plasma Glucose: 108 (calc)
eAG (mmol/L): 6 (calc)

## 2020-04-20 ENCOUNTER — Telehealth: Payer: Self-pay | Admitting: Internal Medicine

## 2020-04-20 ENCOUNTER — Other Ambulatory Visit: Payer: Self-pay

## 2020-04-20 MED ORDER — SERTRALINE HCL 50 MG PO TABS: 50.0000 mg | ORAL_TABLET | Freq: Every day | ORAL | 2 refills | Status: DC

## 2020-04-20 NOTE — Telephone Encounter (Signed)
Not surprising.  It often takes weeks for the medication to take effect.  If she is tolerating the medication well, then increase to Zoloft 50 mg at night (currently on 25 mg of Zoloft at night).  Give [place] an update in 2 weeks.  Thanks

## 2020-04-20 NOTE — Telephone Encounter (Signed)
Spoke with pt and she is aware and new script sent to pharmacy.

## 2020-04-20 NOTE — Telephone Encounter (Signed)
Pt states that medication is not working and she is still having the same sxs as last week. She would like some advise.

## 2020-05-21 ENCOUNTER — Telehealth: Payer: Self-pay | Admitting: Family Medicine

## 2020-05-21 NOTE — Telephone Encounter (Signed)
Left message for patient to call back and schedule Medicare Annual Wellness Visit (AWV) either virtually or in office.  Last AWV 11/21/16; please schedule at anytime with [facility] Nurse Health Advisor 1.  This should be a 45 minute visit.

## 2020-05-25 ENCOUNTER — Encounter: Payer: Self-pay | Admitting: Internal Medicine

## 2020-05-25 ENCOUNTER — Ambulatory Visit: Payer: PPO | Admitting: Internal Medicine

## 2020-05-25 VITALS — BP 154/66 | HR 78 | Ht 66.0 in | Wt 118.0 lb

## 2020-05-25 DIAGNOSIS — R1013 Epigastric pain: Secondary | ICD-10-CM | POA: Diagnosis not present

## 2020-05-25 DIAGNOSIS — R438 Other disturbances of smell and taste: Secondary | ICD-10-CM

## 2020-05-25 DIAGNOSIS — F411 Generalized anxiety disorder: Secondary | ICD-10-CM | POA: Diagnosis not present

## 2020-05-25 NOTE — Progress Notes (Signed)
HISTORY OF PRESENT ILLNESS:  [name] is a 74 y.o. female presents today for follow-up regarding dyspepsia related to anxiety.  She was seen in the office April 11, 2020 as an initial evaluation for the same.  Schedule patient.  Upper endoscopy the following day was normal except for an incidental esophageal ring.  She was treated with Zoloft 25 mg at night.  As well pantoprazole 40 mg daily.  Since that time her Zoloft has been increased to 50 mg at night.  She is tolerating this well.  Number of her dyspeptic symptoms have improved.  She still has depressive symptoms such as anhedonia, sadness, difficulty sleeping, and lack of interest in eating.  She has been slightly more active.  Her appetite has improved a little.  Her weight has been stable.  No new complaints.  Still tolerating her medication well.  She does take 0.25 mg of alprazolam in the early afternoon.  This helps.  She has a strong support network at home.  He has completed her Covid vaccination series  REVIEW OF SYSTEMS:  All non-GI ROS negative unless otherwise stated in the HPI except for fatigue  Past Medical History:  Diagnosis Date   Acute upper respiratory infections of unspecified site    Allergy    ANXIETY 10/12/2008   Arthritis    Cancer ([place])    breast- right   COPD 03/26/2009   pt denies this   GERD (gastroesophageal reflux disease)    HERPES ZOSTER 10/12/2008   Hyperlipidemia    Hypertension    Lumbago    Palpitations    Syncopal episodes 8/10   "presyncopal" prbly vasovagal-MI ruled out; EKG normal; telemetry with PVC's/PAC's. Stress echo-no evidence for ischemia. EF 65-70%. No valvular abnormalities, no pulm. HTN   Urinary tract infection, site not specified     Past Surgical History:  Procedure Laterality Date   BREAST SURGERY     2004- pt denies lymph node removal   COLONOSCOPY  04/20/2003   per Dr. Henrene Pastor, clear. Cologuard 01-24-19 negative (repeat in 3 yrs)    OOPHORECTOMY      ROTATOR CUFF REPAIR Left 2014    Social History [name]  reports that she has quit smoking. She has never used smokeless tobacco. She reports current alcohol use of about 7.0 - 14.0 standard drinks of alcohol per week. She reports that she does not use drugs.  family history includes Alzheimer's disease in her mother; Dementia in her mother; Diabetes in her brother; Parkinsonism in her father.  Allergies  Allergen Reactions   Codeine Phosphate    Ephedrine Hcl    Lipitor [Atorvastatin]     Muscle cramps    Penicillins     REACTION: per allergy test       PHYSICAL EXAMINATION: Vital signs: BP (!) 154/66    Pulse 78    Ht 5\' 6"  (1.676 m)    Wt 118 lb (53.5 kg)    BMI 19.05 kg/m   Constitutional: generally well-appearing, no acute distress Psychiatric: alert and oriented x3, cooperative Eyes: Anicteric Abdomen: Not reexamined Skin: No jaundice Neuro: No gross focal deficits.   ASSESSMENT:  1.  Functional dyspepsia 2.  Anxiety/depression   PLAN:  1.  Increase Zoloft to 75 mg at night. 2.  Continue pantoprazole 40 mg daily 3.  Instructed to schedule herself for activities and eat even if it does not seem to be appealing.  So-called "chores". 4.  Office follow-up in 2 months

## 2020-05-25 NOTE — Patient Instructions (Signed)
Continue your Pantoprazole and Zoloft  Dr. Henrene Pastor has increased your Zoloft to 75mg  (1 1/2 tablet)  Do Your Chores!!

## 2020-06-19 ENCOUNTER — Other Ambulatory Visit: Payer: Self-pay | Admitting: Internal Medicine

## 2020-06-19 MED ORDER — SERTRALINE HCL 50 MG PO TABS
75.0000 mg | ORAL_TABLET | Freq: Every day | ORAL | 2 refills | Status: DC
Start: 2020-06-19 — End: 2020-09-28

## 2020-06-19 NOTE — Telephone Encounter (Signed)
Called and spoke to pt.  She needs a refill on Zoloft.   Dr. Henrene Pastor increased her to 75 mg (1 1/2 tablets) at her last appt in November 5.  Refill sent to pharmacy in chart per pt.

## 2020-06-19 NOTE — Telephone Encounter (Signed)
Call her at [phone] today

## 2020-06-20 ENCOUNTER — Other Ambulatory Visit: Payer: Self-pay | Admitting: Family Medicine

## 2020-06-20 ENCOUNTER — Ambulatory Visit: Payer: PPO | Admitting: Internal Medicine

## 2020-07-30 ENCOUNTER — Ambulatory Visit: Payer: PPO

## 2020-08-27 DIAGNOSIS — M7061 Trochanteric bursitis, right hip: Secondary | ICD-10-CM | POA: Diagnosis not present

## 2020-09-28 ENCOUNTER — Other Ambulatory Visit: Payer: Self-pay | Admitting: Internal Medicine

## 2020-10-01 DIAGNOSIS — Z20822 Contact with and (suspected) exposure to covid-19: Secondary | ICD-10-CM | POA: Diagnosis not present

## 2020-10-03 DIAGNOSIS — J069 Acute upper respiratory infection, unspecified: Secondary | ICD-10-CM | POA: Diagnosis not present

## 2020-10-03 DIAGNOSIS — J300 Vasomotor rhinitis: Secondary | ICD-10-CM | POA: Diagnosis not present

## 2020-10-03 DIAGNOSIS — R059 Cough, unspecified: Secondary | ICD-10-CM | POA: Diagnosis not present

## 2020-10-17 ENCOUNTER — Ambulatory Visit: Payer: PPO

## 2020-11-06 NOTE — Progress Notes (Unsigned)
Subjective:   [name] is a 75 y.o. female who presents for Medicare Annual (Subsequent) preventive examination.  Review of Systems    N/a        Objective:    There were no vitals filed for this visit. There is no height or weight on file to calculate BMI.  Advanced Directives 04/09/2020 10/13/2016  Does Patient Have a Medical Advance Directive? No No  Would patient like information on creating a medical advance directive? No - Patient declined -    Current Medications (verified) Outpatient Encounter Medications as of 11/07/2020  Medication Sig  . ALPRAZolam (XANAX) 0.5 MG tablet Take 1 tablet (0.5 mg total) by mouth 2 (two) times daily as needed for anxiety or sleep.  . cetirizine (ZYRTEC) 10 MG tablet Take 10 mg by mouth daily as needed.   . metoprolol tartrate (LOPRESSOR) 25 MG tablet TAKE 1 TABLET(25 MG) BY MOUTH TWICE DAILY  . pantoprazole (PROTONIX) 40 MG tablet Take 1 tablet (40 mg total) by mouth daily.  . sertraline (ZOLOFT) 50 MG tablet TAKE 1 AND 1/2 TABLETS(75 MG) BY MOUTH AT BEDTIME  . zolpidem (AMBIEN) 5 MG tablet TAKE 1 TABLET(5 MG) BY MOUTH AT BEDTIME AS NEEDED FOR SLEEP   No facility-administered encounter medications on file as of 11/07/2020.    Allergies (verified) Codeine phosphate, Ephedrine hcl, Lipitor [atorvastatin], and Penicillins   History: Past Medical History:  Diagnosis Date  . Acute upper respiratory infections of unspecified site   . Allergy   . ANXIETY 10/12/2008  . Arthritis   . Cancer [facility])    breast- right  . COPD 03/26/2009   pt denies this  . GERD (gastroesophageal reflux disease)   . HERPES ZOSTER 10/12/2008  . Hyperlipidemia   . Hypertension   . Lumbago   . Palpitations   . Syncopal episodes 8/10   "presyncopal" prbly vasovagal-MI ruled out; EKG normal; telemetry with PVC's/PAC's. Stress echo-no evidence for ischemia. EF 65-70%. No valvular abnormalities, no pulm. HTN  . Urinary tract infection, site not specified     Past Surgical History:  Procedure Laterality Date  . BREAST SURGERY     2004- pt denies lymph node removal  . COLONOSCOPY  04/20/2003   per Dr. Henrene Pastor, clear. Cologuard 01-24-19 negative (repeat in 3 yrs)   . OOPHORECTOMY    . ROTATOR CUFF REPAIR Left 2014   Family History  Problem Relation Age of Onset  . Dementia Mother   . Alzheimer's disease Mother   . Parkinsonism Father   . Diabetes Brother   . Colon polyps Neg Hx   . Esophageal cancer Neg Hx   . Rectal cancer Neg Hx   . Stomach cancer Neg Hx    Social History   Socioeconomic History  . Marital status: Married    Spouse name: Not on file  . Number of children: 2  . Years of education: Not on file  . Highest education level: Not on file  Occupational History  . Occupation: Part-time Agricultural consultant: [employer]  Tobacco Use  . Smoking status: Former Research scientist (life sciences)  . Smokeless tobacco: Never Used  Vaping Use  . Vaping Use: Never used  Substance and Sexual Activity  . Alcohol use: Yes    Alcohol/week: 7.0 - 14.0 standard drinks    Types: 7 - 14 Glasses of wine per week    Comment: wodka tonic every night, sometimes 2.   . Drug use: No  . Sexual  activity: Not on file  Other Topics Concern  . Not on file  Social History Narrative  . Not on file   Social Determinants of Health   Financial Resource Strain: Not on file  Food Insecurity: Not on file  Transportation Needs: Not on file  Physical Activity: Not on file  Stress: Not on file  Social Connections: Not on file    Tobacco Counseling Counseling given: Not Answered   Clinical Intake:                 Diabetic?no         Activities of Daily Living No flowsheet data found.  Patient Care Team: Laurey Morale, MD as PCP - General (Family Medicine)  Indicate any recent Medical Services you may have received from other than [facility] providers in the past year (date may be approximate).     Assessment:   This is a routine  wellness examination for [place].  Hearing/Vision screen No exam data present  Dietary issues and exercise activities discussed:    Goals   None    Depression Screen PHQ 2/9 Scores 09/03/2015 06/12/2014 06/02/2013  PHQ - 2 Score 0 0 0    Fall Risk Fall Risk  09/03/2015 06/12/2014 06/02/2013  Falls in the past year? No No No    FALL RISK PREVENTION PERTAINING TO THE HOME:  Any stairs in or around the home? {YES/NO:21197} If so, are there any without handrails? Yes  Home free of loose throw rugs in walkways, pet beds, electrical cords, etc? Yes  Adequate lighting in your home to reduce risk of falls? Yes   ASSISTIVE DEVICES UTILIZED TO PREVENT FALLS:  Life alert? {YES/NO:21197} Use of a cane, walker or w/c? {YES/NO:21197} Grab bars in the bathroom? {YES/NO:21197} Shower chair or bench in shower? {YES/NO:21197} Elevated toilet seat or a handicapped toilet? {YES/NO:21197}  TIMED UP AND GO:  Was the test performed? {YES/NO:21197}.  Length of time to ambulate 10 feet: *** sec.   {Appearance of VFIE:3329518}  Cognitive Function:     Normal cognitive status assessed by direct observation by this Nurse Health Advisor. No abnormalities found.      Immunizations Immunization History  Administered Date(s) Administered  . Influenza Split 05/19/2011, 05/25/2012  . Influenza Whole 05/24/2007  . Influenza, High Dose Seasonal PF 06/02/2013, 06/12/2014, 06/07/2015, 06/06/2018  . Influenza-Unspecified 07/16/2017  . PFIZER(Purple Top)SARS-COV-2 Vaccination 08/05/2019, 08/24/2019  . Pneumococcal Conjugate-13 06/12/2014  . Pneumococcal Polysaccharide-23 05/24/2007, 05/30/2011  . Tdap 05/30/2011    TDAP status: Up to date  Flu Vaccine status: Up to date  Pneumococcal vaccine status: Up to date  Covid-19 vaccine status: Completed vaccines  Qualifies for Shingles Vaccine? Yes   Zostavax completed No   Shingrix Completed?: No.    Education has been provided regarding the  importance of this vaccine. Patient has been advised to call insurance company to determine out of pocket expense if they have not yet received this vaccine. Advised may also receive vaccine at local pharmacy or Health Dept. Verbalized acceptance and understanding.  Screening Tests Health Maintenance  Topic Date Due  . Hepatitis C Screening  Never done  . COLONOSCOPY (Pts 45-22yrs Insurance coverage will need to be confirmed)  04/19/2013  . MAMMOGRAM  02/01/2021  . INFLUENZA VACCINE  02/18/2021  . TETANUS/TDAP  05/29/2021  . DEXA SCAN  Completed  . COVID-19 Vaccine  Completed  . PNA vac Low Risk Adult  Completed  . HPV VACCINES  Aged Out  Health Maintenance  Health Maintenance Due  Topic Date Due  . Hepatitis C Screening  Never done  . COLONOSCOPY (Pts 45-21yrs Insurance coverage will need to be confirmed)  04/19/2013    Colorectal cancer screening: Referral to GI placed pt declines . Pt aware the office will call re: appt.  Mammogram status: Completed 0/7/15/21. Repeat every year  Bone Density status: Completed 12/22/2019. Results reflect: Bone density results: NORMAL. Repeat every 10 years.  Lung Cancer Screening: (Low Dose CT Chest recommended if Age 18-80 years, 30 pack-year currently smoking OR have quit w/in 15years.) does not qualify.   Lung Cancer Screening Referral: n/a  Additional Screening:  Hepatitis C Screening: does qualify  Vision Screening: Recommended annual ophthalmology exams for early detection of glaucoma and other disorders of the eye. Is the patient up to date with their annual eye exam?  {YES/NO:21197} Who is the provider or what is the name of the office in which the patient attends annual eye exams? *** If pt is not established with a provider, would they like to be referred to a provider to establish care? {YES/NO:21197}.   Dental Screening: Recommended annual dental exams for proper oral hygiene  Community Resource Referral / Chronic Care  Management: CRR required this visit?  No   CCM required this visit?  No      Plan:     I have personally reviewed and noted the following in the patient's chart:   . Medical and social history . Use of alcohol, tobacco or illicit drugs  . Current medications and supplements . Functional ability and status . Nutritional status . Physical activity . Advanced directives . List of other physicians . Hospitalizations, surgeries, and ER visits in previous 12 months . Vitals . Screenings to include cognitive, depression, and falls . Referrals and appointments  In addition, I have reviewed and discussed with patient certain preventive protocols, quality metrics, and best practice recommendations. A written personalized care plan for preventive services as well as general preventive health recommendations were provided to patient.     Randel Pigg, LPN   [id number]   Nurse Notes: none

## 2020-11-07 ENCOUNTER — Ambulatory Visit: Payer: PPO

## 2020-11-16 ENCOUNTER — Other Ambulatory Visit: Payer: Self-pay | Admitting: Family Medicine

## 2020-11-19 DIAGNOSIS — L570 Actinic keratosis: Secondary | ICD-10-CM | POA: Diagnosis not present

## 2020-11-19 DIAGNOSIS — L659 Nonscarring hair loss, unspecified: Secondary | ICD-10-CM | POA: Diagnosis not present

## 2020-11-29 ENCOUNTER — Telehealth: Payer: Self-pay | Admitting: Family Medicine

## 2020-11-29 DIAGNOSIS — J45991 Cough variant asthma: Secondary | ICD-10-CM | POA: Diagnosis not present

## 2020-11-29 DIAGNOSIS — J209 Acute bronchitis, unspecified: Secondary | ICD-10-CM | POA: Diagnosis not present

## 2020-11-29 DIAGNOSIS — J300 Vasomotor rhinitis: Secondary | ICD-10-CM | POA: Diagnosis not present

## 2020-11-29 NOTE — Telephone Encounter (Signed)
I schedule patient AWV and follow up with pcp. 12/12/20   Patient had questions regarding chol meds.  Patient wanted to know if she could get labs prior to appointment.  Please advise

## 2020-11-30 ENCOUNTER — Other Ambulatory Visit: Payer: Self-pay

## 2020-11-30 DIAGNOSIS — E785 Hyperlipidemia, unspecified: Secondary | ICD-10-CM

## 2020-11-30 NOTE — Telephone Encounter (Signed)
Pt has been scheduled for fasting lab appointment  (Lipids) on 12/03/2020. Lipid order on Epic

## 2020-12-06 ENCOUNTER — Ambulatory Visit: Payer: PPO | Admitting: Family Medicine

## 2020-12-06 ENCOUNTER — Ambulatory Visit: Payer: PPO

## 2020-12-10 ENCOUNTER — Other Ambulatory Visit: Payer: PPO

## 2020-12-12 ENCOUNTER — Ambulatory Visit: Payer: PPO

## 2020-12-12 ENCOUNTER — Ambulatory Visit: Payer: PPO | Admitting: Family Medicine

## 2020-12-17 ENCOUNTER — Other Ambulatory Visit: Payer: Self-pay | Admitting: Family Medicine

## 2020-12-18 NOTE — Telephone Encounter (Signed)
Pt needs appointment for further refills 

## 2020-12-19 ENCOUNTER — Other Ambulatory Visit: Payer: Self-pay

## 2020-12-19 ENCOUNTER — Other Ambulatory Visit (INDEPENDENT_AMBULATORY_CARE_PROVIDER_SITE_OTHER): Payer: PPO

## 2020-12-19 DIAGNOSIS — E785 Hyperlipidemia, unspecified: Secondary | ICD-10-CM

## 2020-12-19 LAB — LIPID PANEL
Cholesterol: 273 mg/dL — ABNORMAL HIGH (ref 0–200)
HDL: 76.1 mg/dL (ref >39.00)
LDL Cholesterol: 179 mg/dL — ABNORMAL HIGH (ref 0–99)
NonHDL: 197.27
Total CHOL/HDL Ratio: 4
Triglycerides: 90 mg/dL (ref 0.0–149.0)
VLDL: 18 mg/dL (ref 0.0–40.0)

## 2020-12-20 ENCOUNTER — Other Ambulatory Visit: Payer: Self-pay

## 2020-12-21 ENCOUNTER — Encounter: Payer: Self-pay | Admitting: Family Medicine

## 2020-12-21 ENCOUNTER — Ambulatory Visit (INDEPENDENT_AMBULATORY_CARE_PROVIDER_SITE_OTHER): Payer: PPO | Admitting: Family Medicine

## 2020-12-21 VITALS — BP 140/78 | HR 71 | Temp 98.3°F | Wt 117.6 lb

## 2020-12-21 DIAGNOSIS — E785 Hyperlipidemia, unspecified: Secondary | ICD-10-CM

## 2020-12-21 NOTE — Progress Notes (Signed)
   Subjective:    Patient ID: [name], female    DOB: May 21, 1946, 75 y.o.   MRN: [id number]  HPI Here to discuss her high cholesterol. She has consistently had high LDL levels, and most recently this was 179. She eats a very healthy diet. She has tried statins and could not tolerate them due to mylagias. She wants to discuss her options.   Review of Systems  Constitutional: Negative.   Respiratory: Negative.   Cardiovascular: Negative.        Objective:   Physical Exam Constitutional:      Appearance: Normal appearance.  Cardiovascular:     Rate and Rhythm: Normal rate and regular rhythm.     Pulses: Normal pulses.     Heart sounds: Normal heart sounds.  Pulmonary:     Effort: Pulmonary effort is normal.     Breath sounds: Normal breath sounds.  Neurological:     Mental Status: She is alert.           Assessment & Plan:  Dyslipidemia. We discussed some of the treatment options, and we agreed that she would see the Lipid Clinic to discuss these further. We will put in this referral. [name], MD

## 2020-12-24 ENCOUNTER — Telehealth: Payer: Self-pay | Admitting: Family Medicine

## 2020-12-24 NOTE — Telephone Encounter (Signed)
Left message for patient to call back and schedule Medicare Annual Wellness Visit (AWV) either virtually or in office.   Last AWV 11/21/16 please schedule at anytime with [facility] Nurse Health Advisor 1 or 2   This should be a 45 minute visit.

## 2020-12-25 ENCOUNTER — Telehealth: Payer: Self-pay | Admitting: Family Medicine

## 2020-12-25 DIAGNOSIS — E785 Hyperlipidemia, unspecified: Secondary | ICD-10-CM

## 2020-12-25 NOTE — Telephone Encounter (Signed)
Pt is calling in to see if the referral to the [facility] has been placed and if not if it can be placed as soon as possible so that she can get on their schedule.

## 2020-12-25 NOTE — Telephone Encounter (Signed)
See 12/21/20, office note.

## 2020-12-25 NOTE — Telephone Encounter (Signed)
I just now did the referral to the [facility]

## 2020-12-26 NOTE — Telephone Encounter (Signed)
Spoke with pt advised  that Dr Sarajane Jews has placed referral to Lipid clinic and she should get a call from the clinic to schedule

## 2021-01-02 ENCOUNTER — Telehealth: Payer: Self-pay | Admitting: Family Medicine

## 2021-01-02 DIAGNOSIS — R42 Dizziness and giddiness: Secondary | ICD-10-CM | POA: Diagnosis not present

## 2021-01-02 NOTE — Telephone Encounter (Signed)
Patient is calling and stated that she was referred to a cardiologist and she can't get scheduled until 10/3. Pt wanted to know if provider would prescribe something until she can be seen, please advise. CB is [phone]

## 2021-01-02 NOTE — Telephone Encounter (Signed)
Last office visit- 12/21/2020

## 2021-01-03 ENCOUNTER — Ambulatory Visit: Payer: PPO | Admitting: Podiatry

## 2021-01-03 ENCOUNTER — Other Ambulatory Visit: Payer: Self-pay

## 2021-01-03 ENCOUNTER — Encounter: Payer: Self-pay | Admitting: Podiatry

## 2021-01-03 DIAGNOSIS — M779 Enthesopathy, unspecified: Secondary | ICD-10-CM | POA: Diagnosis not present

## 2021-01-03 NOTE — Telephone Encounter (Signed)
She was referred to the Lipid Clinic. No, there is nothing to prescribe her in the meantime. Just keep watching the diet

## 2021-01-03 NOTE — Telephone Encounter (Signed)
Discussed Dr Sarajane Jews advise with pt verbalized understanding

## 2021-01-03 NOTE — Progress Notes (Signed)
Subjective:   Patient ID: [name], female   DOB: 75 y.o.   MRN: [id number]   HPI Patient presents stating she has had some cold feet and may be nerve issues and was concerned because she has high lipid count.  Wanted to get everything checked and had previous bunion surgery by [place]   Review of Systems  All other systems reviewed and are negative.      Objective:  Physical Exam Vitals and nursing note reviewed.  Constitutional:      Appearance: She is well-developed.  Pulmonary:     Effort: Pulmonary effort is normal.  Musculoskeletal:        General: Normal range of motion.  Skin:    General: Skin is warm.  Neurological:     Mental Status: She is alert.    Neurovascular status found to be intact muscle strength found to be adequate range of motion adequate with good muscle strength and good digital perfusion.  Patient does have mild discomfort which may be more inflammation based and does have mild bunion deformity left foot with correction of the right foot having been done     Assessment:  I do not think this is a significant pathology with good circulatory status and not concerned about coldness she gets at night and I did discuss possible low-grade tendinitis bunion deformity H&P     Plan:  Reviewed condition do not recommend treatment except for wearing warm socks and patient will be seen back as needed

## 2021-01-23 ENCOUNTER — Ambulatory Visit (INDEPENDENT_AMBULATORY_CARE_PROVIDER_SITE_OTHER): Payer: PPO | Admitting: Family Medicine

## 2021-01-23 ENCOUNTER — Encounter: Payer: Self-pay | Admitting: Family Medicine

## 2021-01-23 ENCOUNTER — Other Ambulatory Visit: Payer: Self-pay

## 2021-01-23 VITALS — BP 118/74 | HR 64 | Temp 97.6°F | Ht 66.0 in | Wt 118.2 lb

## 2021-01-23 DIAGNOSIS — R42 Dizziness and giddiness: Secondary | ICD-10-CM

## 2021-01-23 DIAGNOSIS — Z0000 Encounter for general adult medical examination without abnormal findings: Secondary | ICD-10-CM | POA: Diagnosis not present

## 2021-01-23 LAB — CBC WITH DIFFERENTIAL/PLATELET
Basophils Absolute: 0.1 10*3/uL (ref 0.0–0.1)
Basophils Relative: 1.8 % (ref 0.0–3.0)
Eosinophils Absolute: 0.3 10*3/uL (ref 0.0–0.7)
Eosinophils Relative: 7 % — ABNORMAL HIGH (ref 0.0–5.0)
HCT: 38.7 % (ref 36.0–46.0)
Hemoglobin: 13.1 g/dL (ref 12.0–15.0)
Lymphocytes Relative: 28.4 % (ref 12.0–46.0)
Lymphs Abs: 1.2 10*3/uL (ref 0.7–4.0)
MCHC: 33.9 g/dL (ref 30.0–36.0)
MCV: 97.6 fl (ref 78.0–100.0)
Monocytes Absolute: 0.3 10*3/uL (ref 0.1–1.0)
Monocytes Relative: 6.9 % (ref 3.0–12.0)
Neutro Abs: 2.3 10*3/uL (ref 1.4–7.7)
Neutrophils Relative %: 55.9 % (ref 43.0–77.0)
Platelets: 237 10*3/uL (ref 150.0–400.0)
RBC: 3.97 Mil/uL (ref 3.87–5.11)
RDW: 12.3 % (ref 11.5–15.5)
WBC: 4.1 10*3/uL (ref 4.0–10.5)

## 2021-01-23 LAB — HEPATIC FUNCTION PANEL
ALT: 14 U/L (ref 0–35)
AST: 20 U/L (ref 0–37)
Albumin: 4.3 g/dL (ref 3.5–5.2)
Alkaline Phosphatase: 72 U/L (ref 39–117)
Bilirubin, Direct: 0.1 mg/dL (ref 0.0–0.3)
Total Bilirubin: 0.6 mg/dL (ref 0.2–1.2)
Total Protein: 6.7 g/dL (ref 6.0–8.3)

## 2021-01-23 LAB — VITAMIN B12: Vitamin B-12: 159 pg/mL — ABNORMAL LOW (ref 211–911)

## 2021-01-23 LAB — TSH: TSH: 2.68 u[IU]/mL (ref 0.35–5.50)

## 2021-01-23 LAB — BASIC METABOLIC PANEL
BUN: 11 mg/dL (ref 6–23)
CO2: 29 mEq/L (ref 19–32)
Calcium: 9.2 mg/dL (ref 8.4–10.5)
Chloride: 103 mEq/L (ref 96–112)
Creatinine, Ser: 0.77 mg/dL (ref 0.40–1.20)
GFR: 75.43 mL/min (ref >60.00)
Glucose, Bld: 85 mg/dL (ref 70–99)
Potassium: 4.2 mEq/L (ref 3.5–5.1)
Sodium: 138 mEq/L (ref 135–145)

## 2021-01-23 LAB — HEMOGLOBIN A1C: Hgb A1c MFr Bld: 5.8 % (ref 4.6–6.5)

## 2021-01-23 LAB — T3, FREE: T3, Free: 3.3 pg/mL (ref 2.3–4.2)

## 2021-01-23 LAB — T4, FREE: Free T4: 0.84 ng/dL (ref 0.60–1.60)

## 2021-01-23 MED ORDER — ALPRAZOLAM 0.5 MG PO TABS: 0.5000 mg | ORAL_TABLET | Freq: Two times a day (BID) | ORAL | 5 refills | Status: DC | PRN

## 2021-01-23 NOTE — Progress Notes (Signed)
   Subjective:    Patient ID: [name], female    DOB: [birth year], 75 y.o.   MRN: [id number]  HPI Here for a well exam. She has only one concern. She has been dealing with some vertigo and she made herself an appt with  PT she knows who does vestibular rehab. She has been to 2 visits and she already feels better. She asks for a referral to ENT as well. She is scheduled to see the lipid Clinic on 04-22-21.    Review of Systems  Constitutional: Negative.   HENT: Negative.    Eyes: Negative.   Respiratory: Negative.    Cardiovascular: Negative.   Gastrointestinal: Negative.   Genitourinary:  Negative for decreased urine volume, difficulty urinating, dyspareunia, dysuria, enuresis, flank pain, frequency, hematuria, pelvic pain and urgency.  Musculoskeletal: Negative.   Skin: Negative.   Neurological:  Positive for dizziness. Negative for headaches.  Psychiatric/Behavioral: Negative.        Objective:   Physical Exam Constitutional:      General: She is not in acute distress.    Appearance: Normal appearance. She is well-developed.  HENT:     Head: Normocephalic and atraumatic.     Right Ear: External ear normal.     Left Ear: External ear normal.     Nose: Nose normal.     Mouth/Throat:     Pharynx: No oropharyngeal exudate.  Eyes:     General: No scleral icterus.    Conjunctiva/sclera: Conjunctivae normal.     Pupils: Pupils are equal, round, and reactive to light.  Neck:     Thyroid: No thyromegaly.     Vascular: No JVD.  Cardiovascular:     Rate and Rhythm: Normal rate and regular rhythm.     Heart sounds: Normal heart sounds. No murmur heard.   No friction rub. No gallop.  Pulmonary:     Effort: Pulmonary effort is normal. No respiratory distress.     Breath sounds: Normal breath sounds. No wheezing or rales.  Chest:     Chest wall: No tenderness.  Abdominal:     General: Bowel sounds are normal. There is no distension.     Palpations: Abdomen is soft. There  is no mass.     Tenderness: There is no abdominal tenderness. There is no guarding or rebound.  Musculoskeletal:        General: No tenderness. Normal range of motion.     Cervical back: Normal range of motion and neck supple.  Lymphadenopathy:     Cervical: No cervical adenopathy.  Skin:    General: Skin is warm and dry.     Findings: No erythema or rash.  Neurological:     Mental Status: She is alert and oriented to person, place, and time.     Cranial Nerves: No cranial nerve deficit.     Motor: No abnormal muscle tone.     Coordination: Coordination normal.     Deep Tendon Reflexes: Reflexes are normal and symmetric. Reflexes normal.  Psychiatric:        Behavior: Behavior normal.        Thought Content: Thought content normal.        Judgment: Judgment normal.          Assessment & Plan:  Well exam. We discussed diet and exercise. Get fasting labs. We will refer her to ENT as above.  [name], MD

## 2021-01-24 ENCOUNTER — Other Ambulatory Visit: Payer: Self-pay | Admitting: Family Medicine

## 2021-01-24 DIAGNOSIS — E538 Deficiency of other specified B group vitamins: Secondary | ICD-10-CM

## 2021-01-24 DIAGNOSIS — R42 Dizziness and giddiness: Secondary | ICD-10-CM | POA: Diagnosis not present

## 2021-01-25 ENCOUNTER — Other Ambulatory Visit: Payer: Self-pay

## 2021-01-25 ENCOUNTER — Ambulatory Visit (INDEPENDENT_AMBULATORY_CARE_PROVIDER_SITE_OTHER): Payer: PPO

## 2021-01-25 DIAGNOSIS — E538 Deficiency of other specified B group vitamins: Secondary | ICD-10-CM

## 2021-01-25 MED ORDER — CYANOCOBALAMIN 1000 MCG/ML IJ SOLN
1000.0000 ug | Freq: Once | INTRAMUSCULAR | Status: AC
  Administered 2021-01-25: 1000 ug via INTRAMUSCULAR

## 2021-01-25 NOTE — Progress Notes (Signed)
Per orders of Dr. Fry, injection of Cyanocobalamin 1000 mcg given by Ott Zimmerle L Fiza Nation. °Patient tolerated injection well.  °

## 2021-02-01 ENCOUNTER — Ambulatory Visit (INDEPENDENT_AMBULATORY_CARE_PROVIDER_SITE_OTHER): Payer: PPO

## 2021-02-01 ENCOUNTER — Other Ambulatory Visit: Payer: Self-pay

## 2021-02-01 DIAGNOSIS — E538 Deficiency of other specified B group vitamins: Secondary | ICD-10-CM | POA: Diagnosis not present

## 2021-02-01 MED ORDER — CYANOCOBALAMIN 1000 MCG/ML IJ SOLN
1000.0000 ug | Freq: Once | INTRAMUSCULAR | Status: AC
  Administered 2021-02-01: 1000 ug via INTRAMUSCULAR

## 2021-02-01 NOTE — Progress Notes (Signed)
Per orders of Dr. Sarajane Jews , injection of b12  given by Sandford Craze RN in rt deltoid  Patient tolerated injection well. Will return in a week.

## 2021-02-06 DIAGNOSIS — R42 Dizziness and giddiness: Secondary | ICD-10-CM | POA: Diagnosis not present

## 2021-02-08 ENCOUNTER — Ambulatory Visit (INDEPENDENT_AMBULATORY_CARE_PROVIDER_SITE_OTHER): Payer: PPO

## 2021-02-08 ENCOUNTER — Other Ambulatory Visit: Payer: Self-pay

## 2021-02-08 DIAGNOSIS — E538 Deficiency of other specified B group vitamins: Secondary | ICD-10-CM

## 2021-02-08 MED ORDER — CYANOCOBALAMIN 1000 MCG/ML IJ SOLN
1000.0000 ug | Freq: Once | INTRAMUSCULAR | Status: AC
  Administered 2021-02-08: 1000 ug via INTRAMUSCULAR

## 2021-02-08 NOTE — Progress Notes (Signed)
Per orders of Laurey Morale, MD, injection of B12 given in  Right deltoid by Esias Mory D Manna Gose. Patient tolerated injection well.  Lab Results  Component Value Date   VITAMINB12 159 (L) 01/23/2021

## 2021-02-14 DIAGNOSIS — Z1231 Encounter for screening mammogram for malignant neoplasm of breast: Secondary | ICD-10-CM | POA: Diagnosis not present

## 2021-02-14 LAB — HM MAMMOGRAPHY

## 2021-02-15 ENCOUNTER — Ambulatory Visit (INDEPENDENT_AMBULATORY_CARE_PROVIDER_SITE_OTHER): Payer: PPO

## 2021-02-15 ENCOUNTER — Other Ambulatory Visit: Payer: Self-pay

## 2021-02-15 ENCOUNTER — Encounter: Payer: Self-pay | Admitting: Family Medicine

## 2021-02-15 DIAGNOSIS — E538 Deficiency of other specified B group vitamins: Secondary | ICD-10-CM | POA: Diagnosis not present

## 2021-02-15 MED ORDER — CYANOCOBALAMIN 1000 MCG/ML IJ SOLN
1000.0000 ug | Freq: Once | INTRAMUSCULAR | Status: AC
  Administered 2021-02-15: 1000 ug via INTRAMUSCULAR

## 2021-02-15 NOTE — Progress Notes (Signed)
Per orders of Laurey Morale, MD, injection of B12 given in left  deltoid by Nysha Koplin D Breeley Bischof. Patient tolerated injection well.  Lab Results  Component Value Date   VITAMINB12 159 (L) 01/23/2021

## 2021-02-18 ENCOUNTER — Telehealth: Payer: Self-pay | Admitting: Family Medicine

## 2021-02-18 NOTE — Telephone Encounter (Signed)
Left message for patient to call back and schedule Medicare Annual Wellness Visit (AWV) either virtually or in office.   Last AWV  11/21/16 ; please schedule at anytime with [facility] Nurse Health Advisor 1 or 2   This should be a 45 minute visit.

## 2021-02-22 ENCOUNTER — Ambulatory Visit (INDEPENDENT_AMBULATORY_CARE_PROVIDER_SITE_OTHER): Payer: PPO

## 2021-02-22 ENCOUNTER — Other Ambulatory Visit: Payer: Self-pay

## 2021-02-22 DIAGNOSIS — E538 Deficiency of other specified B group vitamins: Secondary | ICD-10-CM | POA: Diagnosis not present

## 2021-02-22 MED ORDER — CYANOCOBALAMIN 1000 MCG/ML IJ SOLN
1000.0000 ug | Freq: Once | INTRAMUSCULAR | Status: AC
  Administered 2021-02-22: 1000 ug via INTRAMUSCULAR

## 2021-02-22 NOTE — Progress Notes (Signed)
Per orders of Dr. Fry, injection of Cyanocobalamin 1,000 mcg/mL given by Makenleigh Crownover N Ell Tiso. Patient tolerated injection well.  

## 2021-03-04 ENCOUNTER — Ambulatory Visit (INDEPENDENT_AMBULATORY_CARE_PROVIDER_SITE_OTHER): Payer: PPO

## 2021-03-04 ENCOUNTER — Other Ambulatory Visit: Payer: Self-pay

## 2021-03-04 DIAGNOSIS — E538 Deficiency of other specified B group vitamins: Secondary | ICD-10-CM

## 2021-03-04 MED ORDER — CYANOCOBALAMIN 1000 MCG/ML IJ SOLN
1000.0000 ug | Freq: Once | INTRAMUSCULAR | Status: AC
  Administered 2021-03-04: 1000 ug via INTRAMUSCULAR

## 2021-03-04 NOTE — Progress Notes (Addendum)
Per orders of Laurey Morale, MD, injection of B12 given in Left deltoid by Franco Collet. Patient tolerated injection well.  Lab Results  Component Value Date   VITAMINB12 159 (L) 01/23/2021

## 2021-03-04 NOTE — Patient Instructions (Signed)
Health Maintenance Due  Topic Date Due   Hepatitis C Screening  Never done   Zoster Vaccines- Shingrix (1 of 2) Never done   COLONOSCOPY (Pts 45-77yrs Insurance coverage will need to be confirmed)  04/19/2013   COVID-19 Vaccine (4 - Booster for Pfizer series) 08/31/2020   INFLUENZA VACCINE  02/18/2021    Depression screen [facility] 2/9 01/23/2021 09/03/2015 06/12/2014  Decreased Interest 0 0 0  Down, Depressed, Hopeless 0 0 0  PHQ - 2 Score 0 0 0  Altered sleeping 0 - -  Tired, decreased energy 1 - -  Change in appetite 0 - -  Feeling bad or failure about yourself  0 - -  Trouble concentrating 0 - -  Moving slowly or fidgety/restless 0 - -  Suicidal thoughts 0 - -  PHQ-9 Score 1 - -  Difficult doing work/chores Not difficult at all - -

## 2021-03-06 DIAGNOSIS — M7061 Trochanteric bursitis, right hip: Secondary | ICD-10-CM | POA: Diagnosis not present

## 2021-03-11 ENCOUNTER — Other Ambulatory Visit: Payer: Self-pay

## 2021-03-11 ENCOUNTER — Ambulatory Visit (INDEPENDENT_AMBULATORY_CARE_PROVIDER_SITE_OTHER): Payer: PPO

## 2021-03-11 DIAGNOSIS — E538 Deficiency of other specified B group vitamins: Secondary | ICD-10-CM | POA: Diagnosis not present

## 2021-03-11 MED ORDER — CYANOCOBALAMIN 1000 MCG/ML IJ SOLN
1000.0000 ug | Freq: Once | INTRAMUSCULAR | Status: AC
  Administered 2021-03-11: 1000 ug via INTRAMUSCULAR

## 2021-03-11 NOTE — Progress Notes (Signed)
Per orders of Dr. Sarajane Jews, injection of b12 given by Rodrigo Ran. Patient tolerated injection well.

## 2021-03-16 ENCOUNTER — Other Ambulatory Visit: Payer: Self-pay | Admitting: Family Medicine

## 2021-03-18 ENCOUNTER — Other Ambulatory Visit: Payer: Self-pay

## 2021-03-18 ENCOUNTER — Ambulatory Visit (INDEPENDENT_AMBULATORY_CARE_PROVIDER_SITE_OTHER): Payer: PPO | Admitting: Family Medicine

## 2021-03-18 ENCOUNTER — Ambulatory Visit: Payer: PPO

## 2021-03-18 ENCOUNTER — Encounter: Payer: Self-pay | Admitting: Family Medicine

## 2021-03-18 VITALS — BP 128/82 | HR 54 | Temp 98.2°F | Wt 116.0 lb

## 2021-03-18 DIAGNOSIS — E538 Deficiency of other specified B group vitamins: Secondary | ICD-10-CM

## 2021-03-18 LAB — VITAMIN B12: Vitamin B-12: 595 pg/mL (ref 211–911)

## 2021-03-18 NOTE — Progress Notes (Signed)
   Subjective:    Patient ID: [name], female    DOB: [birth year], 75 y.o.   MRN: [id number]  HPI Here to discuss her B12 shots. On 01-23-21 she was found to have a low B12 level at 159. She was started on B12 shots weekly, and so far she has received 7 of these. She wants to stop them because she has a bitter taste in her mouth that makes it difficult to eat. Her last B12 shot was one week ago.    Review of Systems  Constitutional: Negative.   Respiratory: Negative.    Cardiovascular: Negative.       Objective:   Physical Exam Constitutional:      Appearance: Normal appearance.  Cardiovascular:     Rate and Rhythm: Normal rate and regular rhythm.     Pulses: Normal pulses.     Heart sounds: Normal heart sounds.  Pulmonary:     Effort: Pulmonary effort is normal.     Breath sounds: Normal breath sounds.  Neurological:     Mental Status: She is alert.          Assessment & Plan:  B12 deficiency. We will stop the B12 shots. Check a level today. I would anticipate changing her to an oral B12 regimen. [name], MD

## 2021-03-19 NOTE — Addendum Note (Signed)
Addended by: Agnes Lawrence on: 03/19/2021 11:45 AM   Modules accepted: Orders

## 2021-04-01 ENCOUNTER — Other Ambulatory Visit: Payer: Self-pay | Admitting: Internal Medicine

## 2021-04-08 ENCOUNTER — Telehealth: Payer: Self-pay | Admitting: Family Medicine

## 2021-04-22 ENCOUNTER — Other Ambulatory Visit: Payer: Self-pay

## 2021-04-22 ENCOUNTER — Ambulatory Visit (HOSPITAL_BASED_OUTPATIENT_CLINIC_OR_DEPARTMENT_OTHER): Payer: PPO | Admitting: Internal Medicine

## 2021-04-22 VITALS — BP 172/82 | HR 58 | Ht 66.0 in | Wt 118.0 lb

## 2021-04-22 DIAGNOSIS — E785 Hyperlipidemia, unspecified: Secondary | ICD-10-CM

## 2021-04-22 DIAGNOSIS — I700 Atherosclerosis of aorta: Secondary | ICD-10-CM | POA: Diagnosis not present

## 2021-04-22 DIAGNOSIS — I10 Essential (primary) hypertension: Secondary | ICD-10-CM | POA: Diagnosis not present

## 2021-04-22 DIAGNOSIS — M791 Myalgia, unspecified site: Secondary | ICD-10-CM | POA: Diagnosis not present

## 2021-04-22 DIAGNOSIS — T466X5A Adverse effect of antihyperlipidemic and antiarteriosclerotic drugs, initial encounter: Secondary | ICD-10-CM

## 2021-04-22 MED ORDER — ROSUVASTATIN CALCIUM 20 MG PO TABS: 20.0000 mg | ORAL_TABLET | Freq: Every day | ORAL | 3 refills | Status: DC

## 2021-04-22 NOTE — Patient Instructions (Signed)
Medication Instructions:  START rosuvastatin (crestor) 20mg  daily  *If you need a refill on your cardiac medications before your next appointment, please call your pharmacy*   Lab Work: FASTING lab work to check cholesterol before your next appointment   If you have labs (blood work) drawn today and your tests are completely normal, you will receive your results only by: [place] (if you have MyChart) OR A paper copy in the mail If you have any lab test that is abnormal or we need to change your treatment, we will call you to review the results.   Follow-Up: At [facility], you and your health needs are our priority.  As part of our continuing mission to provide you with exceptional heart care, we have created designated Provider Care Teams.  These Care Teams include your primary Cardiologist (physician) and Advanced Practice Providers (APPs -  Physician Assistants and Nurse Practitioners) who all work together to provide you with the care you need, when you need it.  We recommend signing up for the patient portal called "MyChart".  Sign up information is provided on this After Visit Summary.  MyChart is used to connect with patients for Virtual Visits (Telemedicine).  Patients are able to view lab/test results, encounter notes, upcoming appointments, etc.  Non-urgent messages can be sent to your provider as well.   To learn more about what you can do with MyChart, go to [email or web address].    Your next appointment:   4-5 month(s) - lipid clinic  The format for your next appointment:   In Person  Provider:   K. Mali Hilty, MD   Other Instructions

## 2021-04-26 NOTE — Progress Notes (Signed)
LIPID CLINIC CONSULT NOTE  Chief Complaint:  Manage dyslipidemia  Primary Care Physician: Laurey Morale, MD  Primary Cardiologist:  None  HPI:  [name] is a 75 y.o. female who is being seen today for the evaluation of dyslipidemia at the request of Laurey Morale, MD. This is a pleasant 75 year old female kindly referred for evaluation and management of dyslipidemia.  Past medical history is that written for dyslipidemia, hypertension, palpitations and presyncopal episodes.  Most recent labs in June 2022 showed total cholesterol 273, HDL 76, LDL 179 and triglycerides 90.  Unfortunately, she has a history of statin intolerance including muscle cramps before and atorvastatin.  She also had a prior CT of the abdomen and pelvis with contrast in September 2021 which showed aortic atherosclerosis.  PMHx:  Past Medical History:  Diagnosis Date   Acute upper respiratory infections of unspecified site    Allergy    ANXIETY 10/12/2008   Arthritis    Cancer ([place])    breast- right   COPD 03/26/2009   pt denies this   GERD (gastroesophageal reflux disease)    HERPES ZOSTER 10/12/2008   Hyperlipidemia    Hypertension    Lumbago    Palpitations    Syncopal episodes 8/10   "presyncopal" prbly vasovagal-MI ruled out; EKG normal; telemetry with PVC's/PAC's. Stress echo-no evidence for ischemia. EF 65-70%. No valvular abnormalities, no pulm. HTN   Urinary tract infection, site not specified     Past Surgical History:  Procedure Laterality Date   BREAST SURGERY     2004- pt denies lymph node removal   COLONOSCOPY  04/20/2003   per Dr. Henrene Pastor, clear. Cologuard 01-24-19 negative (repeat in 3 yrs)    OOPHORECTOMY     ROTATOR CUFF REPAIR Left 2014    FAMHx:  Family History  Problem Relation Age of Onset   Dementia Mother    Alzheimer's disease Mother    Parkinsonism Father    Diabetes Brother    Colon polyps Neg Hx    Esophageal cancer Neg Hx    Rectal cancer Neg Hx    Stomach  cancer Neg Hx     SOCHx:   reports that she has quit smoking. She has never used smokeless tobacco. She reports current alcohol use of about 7.0 - 14.0 standard drinks per week. She reports that she does not use drugs.  ALLERGIES:  Allergies  Allergen Reactions   Codeine Phosphate    Ephedrine Hcl    Lipitor [Atorvastatin]     Muscle cramps    Penicillins     REACTION: per allergy test    ROS: Pertinent items noted in HPI and remainder of comprehensive ROS otherwise negative.  HOME MEDS: Current Outpatient Medications on File Prior to Visit  Medication Sig Dispense Refill   albuterol (VENTOLIN HFA) 108 (90 Base) MCG/ACT inhaler 2 puffs as needed     ALPRAZolam (XANAX) 0.5 MG tablet Take 1 tablet (0.5 mg total) by mouth 2 (two) times daily as needed for anxiety or sleep. 60 tablet 5   cetirizine (ZYRTEC) 10 MG tablet Take 10 mg by mouth daily as needed.      metoprolol tartrate (LOPRESSOR) 25 MG tablet TAKE 1 TABLET(25 MG) BY MOUTH TWICE DAILY 180 tablet 0   pantoprazole (PROTONIX) 40 MG tablet Take 1 tablet (40 mg total) by mouth daily. Office visit for further refills 90 tablet 0   zolpidem (AMBIEN) 5 MG tablet TAKE 1 TABLET(5 MG) BY MOUTH AT  BEDTIME AS NEEDED FOR SLEEP 30 tablet 5   No current facility-administered medications on file prior to visit.    LABS/IMAGING: No results found for this or any previous visit (from the past 48 hour(s)). No results found.  LIPID PANEL:    Component Value Date/Time   CHOL 273 (H) 12/19/2020 0816   TRIG 90.0 12/19/2020 0816   HDL 76.10 12/19/2020 0816   CHOLHDL 4 12/19/2020 0816   VLDL 18.0 12/19/2020 0816   LDLCALC 179 (H) 12/19/2020 0816   LDLCALC 172 (H) 03/27/2020 0830    WEIGHTS: Wt Readings from Last 3 Encounters:  04/22/21 118 lb (53.5 kg)  03/18/21 116 lb (52.6 kg)  01/23/21 118 lb 4 oz (53.6 kg)    VITALS: BP (!) 172/82 (BP Location: Right Arm, Patient Position: Sitting, Cuff Size: Normal)   Pulse (!) 58   Ht 5'  6" (1.676 m)   Wt 118 lb (53.5 kg)   SpO2 98%   BMI 19.05 kg/m   EXAM: General appearance: alert and no distress Neck: no carotid bruit, no JVD, and thyroid not enlarged, symmetric, no tenderness/mass/nodules Lungs: clear to auscultation bilaterally Heart: regular rate and rhythm, S1, S2 normal, no murmur, click, rub or gallop Abdomen: soft, non-tender; bowel sounds normal; no masses,  no organomegaly Extremities: extremities normal, atraumatic, no cyanosis or edema Pulses: 2+ and symmetric Skin: Skin color, texture, turgor normal. No rashes or lesions Neurologic: Grossly normal : Pleasant  EKG: N/A  ASSESSMENT: Mixed dyslipidemia Hypertension Aortic atherosclerosis Statin myalgia  PLAN: 1.   Ms. Ureste has a mixed dyslipidemia with a high LDL cholesterol.  She does have some ASCVD in the sense that she has some aortic atherosclerosis and hypertension.  Given her age she needs more aggressive lipid-lowering.  She really only failed atorvastatin in the past and therefore will need to trial another statin and I would recommend rosuvastatin 20 mg daily.  If this continues to be tolerated we will repeat lipids in 3 to 4 months and follow-up with me at that time.  Thanks again for the kind referral.  Pixie Casino, MD, FACC, [place] Director of the [facility]  [facility] Diplomate of the American Board of Clinical Lipidology Attending Cardiologist  Direct Dial: [phone]  Fax: [phone]  Website:  [email or web address] 04/26/2021, 11:16 PM

## 2021-05-19 ENCOUNTER — Other Ambulatory Visit: Payer: Self-pay | Admitting: Family Medicine

## 2021-06-16 ENCOUNTER — Other Ambulatory Visit: Payer: Self-pay | Admitting: Family Medicine

## 2021-06-17 ENCOUNTER — Telehealth: Payer: Self-pay | Admitting: Family Medicine

## 2021-06-17 NOTE — Telephone Encounter (Signed)
Requesting 90 day supply of Zolpidem.    Last refill - 05/21/21 Last office visit- 02/2921

## 2021-06-17 NOTE — Telephone Encounter (Signed)
Patient is requesting a refill for zolpidem (AMBIEN) 5 MG tablet [[id number]] . She stated that she was prescribed some by [facility] but it wasn't enough due to Dr.Fry normally prescribing her a 3 month supply so she wouldn't have to call in more.   Patient is requesting a phone call back at [phone].  Please advise.

## 2021-06-18 MED ORDER — ZOLPIDEM TARTRATE 5 MG PO TABS: 5.0000 mg | ORAL_TABLET | Freq: Every evening | ORAL | 5 refills | Status: DC | PRN

## 2021-06-18 NOTE — Telephone Encounter (Signed)
Lvm refill has been sent to the pharmacy.

## 2021-06-18 NOTE — Telephone Encounter (Signed)
Done

## 2021-06-26 DIAGNOSIS — M1812 Unilateral primary osteoarthritis of first carpometacarpal joint, left hand: Secondary | ICD-10-CM | POA: Diagnosis not present

## 2021-06-27 ENCOUNTER — Telehealth (INDEPENDENT_AMBULATORY_CARE_PROVIDER_SITE_OTHER): Payer: PPO | Admitting: Family Medicine

## 2021-06-27 ENCOUNTER — Other Ambulatory Visit: Payer: Self-pay

## 2021-06-27 ENCOUNTER — Encounter: Payer: Self-pay | Admitting: Family Medicine

## 2021-06-27 VITALS — Wt 114.0 lb

## 2021-06-27 DIAGNOSIS — U071 COVID-19: Secondary | ICD-10-CM | POA: Diagnosis not present

## 2021-06-27 MED ORDER — MOLNUPIRAVIR EUA 200MG CAPSULE: 4.0000 | ORAL_CAPSULE | Freq: Two times a day (BID) | ORAL | 0 refills | Status: AC

## 2021-06-27 MED ORDER — BENZONATATE 100 MG PO CAPS: ORAL_CAPSULE | ORAL | 0 refills | Status: DC

## 2021-06-27 NOTE — Progress Notes (Signed)
Virtual Visit via Telephone Note  I connected with [name] on 06/27/21 at  4:00 PM EST by telephone and verified that I am speaking with the correct person using two identifiers.   I discussed the limitations, risks, security and privacy concerns of performing an evaluation and management service by telephone and the availability of in person appointments. I also discussed with the patient that there may be a patient responsible charge related to this service. The patient expressed understanding and agreed to proceed.  Location patient: home, [place] Location provider: work or home office Participants present for the call: patient, provider Patient did not have a visit with me in the prior 7 days to address this/these issue(s).   History of Present Illness:  Acute telemedicine visit for Covid19: -Onset: 1 day ago; tested positive for covid -Symptoms include:cough, congestion, ha, chills, body aches, feels tire -Denies: fever (had one yesterday), CP, SOB, NVD, inability to eat/drink/get out of bed -Has tried:tylenol -Pertinent past medical history: see below -Pertinent medication allergies:  Allergies  Allergen Reactions   Codeine Phosphate    Ephedrine Hcl    Lipitor [Atorvastatin]     Muscle cramps    Penicillins     REACTION: per allergy test  -COVID-19 vaccine status: reports has had a booster Immunization History  Administered Date(s) Administered   Influenza Split 05/19/2011, 05/25/2012   Influenza Whole 05/24/2007   Influenza, High Dose Seasonal PF 06/02/2013, 06/12/2014, 06/07/2015, 06/06/2018   Influenza-Unspecified 07/16/2017, 05/21/2021   PFIZER(Purple Top)SARS-COV-2 Vaccination 08/05/2019, 08/24/2019   Pneumococcal Conjugate-13 06/12/2014   Pneumococcal Polysaccharide-23 05/24/2007, 05/30/2011   Tdap 05/30/2011  -GFR > 60 in July  Past Medical History:  Diagnosis Date   Acute upper respiratory infections of unspecified site    Allergy    ANXIETY 10/12/2008    Arthritis    Cancer ([place])    breast- right   COPD 03/26/2009   pt denies this   GERD (gastroesophageal reflux disease)    HERPES ZOSTER 10/12/2008   Hyperlipidemia    Hypertension    Lumbago    Palpitations    Syncopal episodes 8/10   "presyncopal" prbly vasovagal-MI ruled out; EKG normal; telemetry with PVC's/PAC's. Stress echo-no evidence for ischemia. EF 65-70%. No valvular abnormalities, no pulm. HTN   Urinary tract infection, site not specified    Current Outpatient Medications on File Prior to Visit  Medication Sig Dispense Refill   ALPRAZolam (XANAX) 0.5 MG tablet Take 1 tablet (0.5 mg total) by mouth 2 (two) times daily as needed for anxiety or sleep. 60 tablet 5   cetirizine (ZYRTEC) 10 MG tablet Take 10 mg by mouth daily as needed.      metoprolol tartrate (LOPRESSOR) 25 MG tablet TAKE 1 TABLET(25 MG) BY MOUTH TWICE DAILY 180 tablet 0   pantoprazole (PROTONIX) 40 MG tablet Take 1 tablet (40 mg total) by mouth daily. Office visit for further refills 90 tablet 0   rosuvastatin (CRESTOR) 20 MG tablet Take 1 tablet (20 mg total) by mouth daily. 90 tablet 3   zolpidem (AMBIEN) 5 MG tablet Take 1 tablet (5 mg total) by mouth at bedtime as needed for sleep. 30 tablet 5   No current facility-administered medications on file prior to visit.      Observations/Objective: Patient sounds cheerful and well on the phone. I do not appreciate any SOB. Speech and thought processing are grossly intact. Patient reported vitals:  Assessment and Plan:  COVID-19   Discussed treatment options and risk of  drug interactions, ideal treatment window, potential complications, isolation and precautions for COVID-19.  Discussed possibility of rebound with antivirals and the need to reisolate if it should occur for 5 days. After lengthy discussion, the patient opted for treatment with Legevrio due to being higher risk for complications of covid or severe disease and other factors. Discussed EUA  status of this drug and the fact that there is preliminary limited knowledge of risks/interactions/side effects per EUA document vs possible benefits and precautions. This information was shared with patient during the visit and also was provided in patient instructions.The patient did want a prescription for cough, Tessalon Rx sent.  Other symptomatic care measures summarized in patient instructions. Advised to seek prompt vv or in person care if worsening, new symptoms arise, or if is not improving with treatment. Advised of options for inperson care in case PCP office not available. Did let the patient know that I only do telemedicine shifts for [facility] on Tuesdays and Thursdays and advised a follow up visit with PCP or at an [facility] if has further questions or concerns.   Follow Up Instructions:  I did not refer this patient for an OV with me in the next 24 hours for this/these issue(s).  I discussed the assessment and treatment plan with the patient. The patient was provided an opportunity to ask questions and all were answered. The patient agreed with the plan and demonstrated an understanding of the instructions.   I spent 22 minutes on the date of this visit in the care of this patient. See summary of tasks completed to properly care for this patient in the detailed notes above which also included counseling of above, review of PMH, medications, allergies, evaluation of the patient and ordering and/or  instructing patient on testing and care options.     Lucretia Kern, DO

## 2021-06-27 NOTE — Patient Instructions (Addendum)
HOME CARE TIPS:  -I sent the medication(s) we discussed to your pharmacy: Meds ordered this encounter  Medications   molnupiravir EUA (LAGEVRIO) 200 mg CAPS capsule    Sig: Take 4 capsules (800 mg total) by mouth 2 (two) times daily for 5 days.    Dispense:  40 capsule    Refill:  0   benzonatate (TESSALON PERLES) 100 MG capsule    Sig: 1-2 capsules up to twice daily as needed for cough    Dispense:  30 capsule    Refill:  0    -I sent in the [place] treatment or referral you requested per our discussion. Please see the information provided below and discuss further with the pharmacist/treatment team.   -there is a chance of rebound illness after finishing your treatment. If you become sick again please isolate for an additional 5 days, plus 5 more days of masking.   -can use tylenol if needed for fevers, aches and pains per instructions  -can use nasal saline a few times per day if you have nasal congestion  -stay hydrated, drink plenty of fluids and eat small healthy meals - avoid dairy  -can take 1000 IU (12mg) Vit D3 and 100-500 mg of Vit C daily per instructions  -follow up with your doctor in 2-3 days unless improving and feeling better  -stay home while sick, except to seek medical care. If you have COVID19, you will likely be contagious for 7-10 days. Flu or Influenza is likely contagious for about 7 days. Other respiratory viral infections remain contagious for 5-10+ days depending on the virus and many other factors. Wear a good mask that fits snugly (such as N95 or KN95) if around others to reduce the risk of transmission.  It was nice to meet you today, and I really hope you are feeling better soon. I help  out with telemedicine visits on Tuesdays and Thursdays and am happy to help if you need a follow up virtual visit on those days. Otherwise, if you have any concerns or questions following this visit please schedule a follow up visit with your Primary Care  doctor or seek care at a local urgent care clinic to avoid delays in care.    Seek in person care or schedule a follow up video visit promptly if your symptoms worsen, new concerns arise or you are not improving with treatment. Call 911 and/or seek emergency care if your symptoms are severe or life threatening.  Recommendations per orders an instructions, risks and use of medications and return precautions discussed.    Fact Sheet for Patients And Caregivers Emergency Use Authorization (EUA) Of LAGEVRIOT (molnupiravir) capsules For Coronavirus Disease 2019 (COVID-19)  What is the most important information I should know about LAGEVRIO? LAGEVRIO may cause serious side effects, including: ? LAGEVRIO may cause harm to your unborn baby. It is not known if LAGEVRIO will harm your baby if you take LAGEVRIO during pregnancy. o LAGEVRIO is not recommended for use in pregnancy. o LAGEVRIO has not been studied in pregnancy. LAGEVRIO was studied in pregnant animals only. When LAGEVRIO was given to pregnant animals, LAGEVRIO caused harm to their unborn babies. o You and your healthcare provider may decide that you should take LAGEVRIO during pregnancy if there are no other COVID-19 treatment options approved or authorized by the FDA that are accessible or clinically appropriate for you. o If you and your healthcare provider decide that you should take LRio Blancoduring pregnancy, you and your healthcare provider  should discuss the known and potential benefits and the potential risks of taking LAGEVRIO during pregnancy. For individuals who are able to become pregnant: ? You should use a reliable method of birth control (contraception) consistently and correctly during treatment with LAGEVRIO and for 4 days after the last dose of LAGEVRIO. Talk to your healthcare provider about reliable birth control methods. ? Before starting treatment with [facility] your healthcare provider may do a pregnancy test to  see if you are pregnant before starting treatment with LAGEVRIO. ? Tell your healthcare provider right away if you become pregnant or think you may be pregnant during treatment with LAGEVRIO. Pregnancy Surveillance Program: ? There is a pregnancy surveillance program for individuals who take LAGEVRIO during pregnancy. The purpose of this program is to collect information about the health of you and your baby. Talk to your healthcare provider about how to take part in this program. ? If you take LAGEVRIO during pregnancy and you agree to participate in the pregnancy surveillance program and allow your healthcare provider to share your information with [place], then your healthcare provider will report your use of [place] during pregnancy to [facility] [place]. by calling [phone] or PeacefulBlog.es. For individuals who are sexually active with partners who are able to become pregnant: ? It is not known if LAGEVRIO can affect sperm. While the risk is regarded as low, animal studies to fully assess the potential for LAGEVRIO to affect the babies of males treated with LAGEVRIO have not been completed. A reliable method of birth control (contraception) should be used consistently and correctly during treatment with LAGEVRIO and for at least 3 months after the last dose. The risk to sperm beyond 3 months is not known. Studies to understand the risk to sperm beyond 3 months are ongoing. Talk to your healthcare provider about reliable birth control methods. Talk to your healthcare provider if you have questions or concerns about how LAGEVRIO may affect sperm. You are being given this fact sheet because your healthcare provider believes it is necessary to provide you with LAGEVRIO for the treatment of adults with mild-to-moderate coronavirus disease 2019 (COVID-19) with positive results of direct SARS-CoV-2 viral testing, and who are at high risk for progression to  severe COVID-19 including hospitalization or death, and for whom other COVID-19 treatment options approved or authorized by the FDA are not accessible or clinically appropriate. The U.S. Food and Drug Administration (FDA) has issued an Emergency Use Authorization (EUA) to make LAGEVRIO available during the COVID-19 pandemic (for more details about an EUA please see "What is an Emergency Use Authorization?" at the end of this document). LAGEVRIO is not an FDA-approved medicine in the [place]. Read this Fact Sheet for information about LAGEVRIO. Talk to your healthcare provider about your options if you have any questions. It is your choice to take LAGEVRIO.  What is COVID-19? COVID-19 is caused by a virus called a coronavirus. You can get COVID-19 through close contact with another person who has the virus. COVID-19 illnesses have ranged from very mild-to-severe, including illness resulting in death. While information so far suggests that most COVID-19 illness is mild, serious illness can happen and may cause some of your other medical conditions to become worse. Older people and people of all ages with severe, long lasting (chronic) medical conditions like heart disease, lung disease and diabetes, for example seem to be at higher risk of being hospitalized for COVID-19.  What is LAGEVRIO? LAGEVRIO is an  investigational medicine used to treat mild-to-moderate COVID-19 in adults: ? with positive results of direct SARS-CoV-2 viral testing, and ? who are at high risk for progression to severe COVID-19 including hospitalization or death, and for whom other COVID-19 treatment options approved or authorized by the FDA are not accessible or clinically appropriate. The FDA has authorized the emergency use of LAGEVRIO for the treatment of mild-tomoderate COVID-19 in adults under an EUA. For more information on EUA, see the "What is an Emergency Use Authorization (EUA)?" section at the end of  this Fact Sheet. LAGEVRIO is not authorized: ? for use in people less than 23 years of age. ? for prevention of COVID-19. ? for people needing hospitalization for COVID-19. ? for use for longer than 5 consecutive days.  What should I tell my healthcare provider before I take LAGEVRIO? Tell your healthcare provider if you: ? Have any allergies ? Are breastfeeding or plan to breastfeed ? Have any serious illnesses ? Are taking any medicines (prescription, over-the-counter, vitamins, or herbal products).  How do I take LAGEVRIO? ? Take LAGEVRIO exactly as your healthcare provider tells you to take it. ? Take 4 capsules of LAGEVRIO every 12 hours (for example, at 8 am and at 8 pm) ? Take LAGEVRIO for 5 days. It is important that you complete the full 5 days of treatment with LAGEVRIO. Do not stop taking LAGEVRIO before you complete the full 5 days of treatment, even if you feel better. ? Take LAGEVRIO with or without food. ? You should stay in isolation for as long as your healthcare provider tells you to. Talk to your healthcare provider if you are not sure about how to properly isolate while you have COVID-19. ? Swallow LAGEVRIO capsules whole. Do not open, break, or crush the capsules. If you cannot swallow capsules whole, tell your healthcare provider. ? What to do if you miss a dose: o If it has been less than 10 hours since the missed dose, take it as soon as you remember o If it has been more than 10 hours since the missed dose, skip the missed dose and take your dose at the next scheduled time. ? Do not double the dose of LAGEVRIO to make up for a missed dose.  What are the important possible side effects of LAGEVRIO? ? See, "What is the most important information I should know about LAGEVRIO?" ? Allergic Reactions. Allergic reactions can happen in people taking LAGEVRIO, even after only 1 dose. Stop taking LAGEVRIO and call your healthcare provider right away if you get any  of the following symptoms of an allergic reaction: o hives o rapid heartbeat o trouble swallowing or breathing o swelling of the mouth, lips, or face o throat tightness o hoarseness o skin rash The most common side effects of LAGEVRIO are: ? diarrhea ? nausea ? dizziness These are not all the possible side effects of LAGEVRIO. Not many people have taken LAGEVRIO. Serious and unexpected side effects may happen. This medicine is still being studied, so it is possible that all of the risks are not known at this time.  What other treatment choices are there?  Veklury (remdesivir) is FDA-approved as an intravenous (IV) infusion for the treatment of mildto-moderate LFYBO-17 in certain adults and children. Talk with your doctor to see if Marijean Heath is appropriate for you. Like LAGEVRIO, FDA may also allow for the emergency use of other medicines to treat people with COVID-19. Go to LacrosseProperties.si for more information. It is  your choice to be treated or not to be treated with LAGEVRIO. Should you decide not to take it, it will not change your standard medical care.  What if I am breastfeeding? Breastfeeding is not recommended during treatment with LAGEVRIO and for 4 days after the last dose of LAGEVRIO. If you are breastfeeding or plan to breastfeed, talk to your healthcare provider about your options and specific situation before taking LAGEVRIO.  How do I report side effects with LAGEVRIO? Contact your healthcare provider if you have any side effects that bother you or do not go away. Report side effects to FDA MedWatch at SmoothHits.hu or call 1-800-FDA-1088 (1- 878-648-5715).  How should I store [place]? ? Store LAGEVRIO capsules at room temperature between 48F to 65F (20C to 25C). ? Keep LAGEVRIO and all medicines out of the reach of children and pets. How can I  learn more about COVID-19? ? Ask your healthcare provider. ? Visit SeekRooms.co.uk ? Contact your local or state public health department. ? Call [place] at [phone] (toll free in the [place] ? Visit www.molnupiravir.com  What Is an Emergency Use Authorization (EUA)? The [place] FDA has made [place] available under an emergency access mechanism called an Emergency Use Authorization (EUA) The EUA is supported by a Presenter, broadcasting Health and Human Service (HHS) declaration that circumstances exist to justify emergency use of drugs and biological products during the COVID-19 pandemic. LAGEVRIO for the treatment of mild-to-moderate COVID-19 in adults with positive results of direct SARS-CoV-2 viral testing, who are at high risk for progression to severe COVID-19, including hospitalization or death, and for whom alternative COVID-19 treatment options approved or authorized by FDA are not accessible or clinically appropriate, has not undergone the same type of review as an FDA-approved product. In issuing an EUA under the FIEPP-29 public health emergency, the FDA has determined, among other things, that based on the total amount of scientific evidence available including data from adequate and well-controlled clinical trials, if available, it is reasonable to believe that the product may be effective for diagnosing, treating, or preventing COVID-19, or a serious or life-threatening disease or condition caused by COVID-19; that the known and potential benefits of the product, when used to diagnose, treat, or prevent such disease or condition, outweigh the known and potential risks of such product; and that there are no adequate, approved, and available alternatives.  All of these criteria must be met to allow for the product to be used in the treatment of patients during the COVID-19 pandemic. The EUA for LAGEVRIO is in effect for the duration of the COVID-19 declaration  justifying emergency use of LAGEVRIO, unless terminated or revoked (after which LAGEVRIO may no longer be used under the EUA). For patent information: http://rogers.info/ Copyright  2021-2022 [employer] [place] [place] and its affiliates. All rights reserved. [id number] Revised: March 2022

## 2021-06-28 ENCOUNTER — Other Ambulatory Visit: Payer: Self-pay | Admitting: Internal Medicine

## 2021-07-25 NOTE — Telephone Encounter (Signed)
error 

## 2021-08-07 DIAGNOSIS — E785 Hyperlipidemia, unspecified: Secondary | ICD-10-CM | POA: Diagnosis not present

## 2021-08-08 LAB — LIPOPROTEIN A (LPA): Lipoprotein (a): 42.3 nmol/L (ref <75.0)

## 2021-08-08 LAB — NMR, LIPOPROFILE
Cholesterol, Total: 192 mg/dL (ref 100–199)
HDL Particle Number: 43.6 umol/L (ref >=30.5)
HDL-C: 89 mg/dL (ref >39)
LDL Particle Number: 914 nmol/L (ref <1000)
LDL Size: 21 nm (ref >20.5)
LDL-C (NIH Calc): 92 mg/dL (ref 0–99)
LP-IR Score: 27 (ref <=45)
Small LDL Particle Number: <90 nmol/L (ref <=527)
Triglycerides: 58 mg/dL (ref 0–149)

## 2021-08-12 ENCOUNTER — Encounter (HOSPITAL_BASED_OUTPATIENT_CLINIC_OR_DEPARTMENT_OTHER): Payer: Self-pay | Admitting: Internal Medicine

## 2021-08-12 ENCOUNTER — Ambulatory Visit (HOSPITAL_BASED_OUTPATIENT_CLINIC_OR_DEPARTMENT_OTHER): Payer: PPO | Admitting: Internal Medicine

## 2021-08-12 ENCOUNTER — Other Ambulatory Visit: Payer: Self-pay

## 2021-08-12 VITALS — BP 148/80 | HR 67 | Ht 66.0 in | Wt 114.5 lb

## 2021-08-12 DIAGNOSIS — M791 Myalgia, unspecified site: Secondary | ICD-10-CM

## 2021-08-12 DIAGNOSIS — E785 Hyperlipidemia, unspecified: Secondary | ICD-10-CM | POA: Diagnosis not present

## 2021-08-12 DIAGNOSIS — I700 Atherosclerosis of aorta: Secondary | ICD-10-CM | POA: Diagnosis not present

## 2021-08-12 DIAGNOSIS — T466X5D Adverse effect of antihyperlipidemic and antiarteriosclerotic drugs, subsequent encounter: Secondary | ICD-10-CM

## 2021-08-12 DIAGNOSIS — I10 Essential (primary) hypertension: Secondary | ICD-10-CM

## 2021-08-12 DIAGNOSIS — T466X5A Adverse effect of antihyperlipidemic and antiarteriosclerotic drugs, initial encounter: Secondary | ICD-10-CM

## 2021-08-12 NOTE — Patient Instructions (Signed)

## 2021-08-12 NOTE — Progress Notes (Signed)
LIPID CLINIC CONSULT NOTE  Chief Complaint:  Follow-up dyslipidemia  Primary Care Physician: Laurey Morale, MD  Primary Cardiologist:  None  HPI:  [name] is a 76 y.o. female who is being seen today for the evaluation of dyslipidemia at the request of Laurey Morale, MD. This is a pleasant 76 year old female kindly referred for evaluation and management of dyslipidemia.  Past medical history is that written for dyslipidemia, hypertension, palpitations and presyncopal episodes.  Most recent labs in June 2022 showed total cholesterol 273, HDL 76, LDL 179 and triglycerides 90.  Unfortunately, she has a history of statin intolerance including muscle cramps before and atorvastatin.  She also had a prior CT of the abdomen and pelvis with contrast in September 2021 which showed aortic atherosclerosis.  08/12/2021  [name] returns today for follow-up.  Overall she seems to be doing well and is tolerating rosuvastatin 20 mg daily.  I did test her LP(a) which was negative at 42.3.  Her lipids have improved significantly with a total cholesterol now 192, HDL 89, LDL 92 and triglycerides 58.  Her LDL particle number was 914 which is at target.  She did inquire about whether or not she had any coronary disease.  I told her because she has aortic atherosclerosis were treating her more aggressively but that if she was interested we could get a calcium score.  She did not commit to that at this visit.  PMHx:  Past Medical History:  Diagnosis Date   Acute upper respiratory infections of unspecified site    Allergy    ANXIETY 10/12/2008   Arthritis    Cancer ([place])    breast- right   COPD 03/26/2009   pt denies this   GERD (gastroesophageal reflux disease)    HERPES ZOSTER 10/12/2008   Hyperlipidemia    Hypertension    Lumbago    Palpitations    Syncopal episodes 8/10   "presyncopal" prbly vasovagal-MI ruled out; EKG normal; telemetry with PVC's/PAC's. Stress echo-no evidence for  ischemia. EF 65-70%. No valvular abnormalities, no pulm. HTN   Urinary tract infection, site not specified     Past Surgical History:  Procedure Laterality Date   BREAST SURGERY     2004- pt denies lymph node removal   COLONOSCOPY  04/20/2003   per Dr. Henrene Pastor, clear. Cologuard 01-24-19 negative (repeat in 3 yrs)    OOPHORECTOMY     ROTATOR CUFF REPAIR Left 2014    FAMHx:  Family History  Problem Relation Age of Onset   Dementia Mother    Alzheimer's disease Mother    Parkinsonism Father    Diabetes Brother    Colon polyps Neg Hx    Esophageal cancer Neg Hx    Rectal cancer Neg Hx    Stomach cancer Neg Hx     SOCHx:   reports that she has quit smoking. She has never used smokeless tobacco. She reports current alcohol use of about 7.0 - 14.0 standard drinks per week. She reports that she does not use drugs.  ALLERGIES:  Allergies  Allergen Reactions   Codeine Phosphate    Ephedrine Hcl    Lipitor [Atorvastatin]     Muscle cramps    Penicillins     REACTION: per allergy test    ROS: Pertinent items noted in HPI and remainder of comprehensive ROS otherwise negative.  HOME MEDS: Current Outpatient Medications on File Prior to Visit  Medication Sig Dispense Refill   ALPRAZolam (XANAX) 0.5  MG tablet Take 1 tablet (0.5 mg total) by mouth 2 (two) times daily as needed for anxiety or sleep. 60 tablet 5   cetirizine (ZYRTEC) 10 MG tablet Take 10 mg by mouth daily as needed.      ibuprofen (ADVIL) 400 MG tablet Take 400 mg by mouth every 4 (four) hours as needed.     metoprolol tartrate (LOPRESSOR) 25 MG tablet TAKE 1 TABLET(25 MG) BY MOUTH TWICE DAILY 180 tablet 0   pantoprazole (PROTONIX) 40 MG tablet TAKE 1 TABLET BY MOUTH DAILY 90 tablet 0   rosuvastatin (CRESTOR) 20 MG tablet Take 1 tablet (20 mg total) by mouth daily. 90 tablet 3   zolpidem (AMBIEN) 5 MG tablet Take 1 tablet (5 mg total) by mouth at bedtime as needed for sleep. 30 tablet 5   No current  facility-administered medications on file prior to visit.    LABS/IMAGING: No results found for this or any previous visit (from the past 48 hour(s)). No results found.  LIPID PANEL:    Component Value Date/Time   CHOL 273 (H) 12/19/2020 0816   TRIG 90.0 12/19/2020 0816   HDL 76.10 12/19/2020 0816   CHOLHDL 4 12/19/2020 0816   VLDL 18.0 12/19/2020 0816   LDLCALC 179 (H) 12/19/2020 0816   LDLCALC 172 (H) 03/27/2020 0830    WEIGHTS: Wt Readings from Last 3 Encounters:  08/12/21 114 lb 8 oz (51.9 kg)  06/27/21 114 lb (51.7 kg)  04/22/21 118 lb (53.5 kg)    VITALS: BP (!) 148/80    Pulse 67    Ht 5\' 6"  (1.676 m)    Wt 114 lb 8 oz (51.9 kg)    SpO2 99%    BMI 18.48 kg/m   EXAM: Deferred  EKG: N/A  ASSESSMENT: Mixed dyslipidemia Hypertension Aortic atherosclerosis Statin myalgia  PLAN: 1.   [name] has had significant lipid lowering on 20 mg rosuvastatin.  LDL is now less than 100.  LDL particle number is less than at thousand.  I think given her lack of known coronary disease and minimal family history of heart disease she is adequately treated however if she ultimately gets a calcium score and it shows significant coronary disease, I may try to drive her LDL lower.  For the time being she is content with her current dose of medicine and can follow-up with me as needed as long as her PCP is willing to prescribe her statin.  Thanks for allowing me to participate in her care  Pixie Casino, MD, [facility], [place] Director of the [facility] Lipid [facility]  Cardiovascular [facility] Diplomate of the American Board of Clinical Lipidology Attending Cardiologist  Direct Dial: [phone]   Fax: [phone]  Website:  [email or web address] 08/12/2021, 9:05 AM

## 2021-08-23 ENCOUNTER — Encounter: Payer: Self-pay | Admitting: Family Medicine

## 2021-08-23 ENCOUNTER — Ambulatory Visit (INDEPENDENT_AMBULATORY_CARE_PROVIDER_SITE_OTHER): Payer: PPO | Admitting: Family Medicine

## 2021-08-23 VITALS — BP 130/86 | HR 61 | Temp 98.0°F | Wt 111.4 lb

## 2021-08-23 DIAGNOSIS — E538 Deficiency of other specified B group vitamins: Secondary | ICD-10-CM | POA: Diagnosis not present

## 2021-08-23 LAB — VITAMIN B12: Vitamin B-12: 246 pg/mL (ref 211–911)

## 2021-08-23 NOTE — Progress Notes (Signed)
° °  Subjective:    Patient ID: [name], female    DOB: [birth year], 76 y.o.   MRN: [id number]  HPI Here to follow up on B12 deficiency. Last year she took B12 shots for 12 weeks and we got her level up from 159 to 595. Since then she has been taking OTC B12. She felt great after the shots, but now for 3 months she is beginning to feel like she did when the deficiency was diagnosed. She feels tired all the time and her hair is thinning.    Review of Systems  Constitutional:  Positive for fatigue.  Respiratory: Negative.    Cardiovascular: Negative.       Objective:   Physical Exam Constitutional:      Appearance: Normal appearance. She is well-developed.  Eyes:     General: No scleral icterus. Neck:     Thyroid: No thyromegaly.     Vascular: No JVD.  Cardiovascular:     Rate and Rhythm: Normal rate and regular rhythm.     Pulses: Normal pulses.     Heart sounds: Normal heart sounds. No murmur heard.   No friction rub. No gallop.  Pulmonary:     Effort: Pulmonary effort is normal. No respiratory distress.     Breath sounds: Normal breath sounds. No wheezing or rales.  Neurological:     Mental Status: She is alert.     Motor: No abnormal muscle tone.          Assessment & Plan:  B12 defiiciency. We will check a level today. I suspect she may need to get back on shots.  [name], MD

## 2021-08-27 ENCOUNTER — Telehealth: Payer: Self-pay | Admitting: Family Medicine

## 2021-08-27 ENCOUNTER — Ambulatory Visit (INDEPENDENT_AMBULATORY_CARE_PROVIDER_SITE_OTHER): Payer: PPO

## 2021-08-27 DIAGNOSIS — E538 Deficiency of other specified B group vitamins: Secondary | ICD-10-CM | POA: Diagnosis not present

## 2021-08-27 MED ORDER — CYANOCOBALAMIN 1000 MCG/ML IJ SOLN
1000.0000 ug | Freq: Once | INTRAMUSCULAR | Status: AC
  Administered 2021-08-27: 1000 ug via INTRAMUSCULAR

## 2021-08-27 NOTE — Progress Notes (Signed)
Pt here for monthly B12 injection per Dr Sarajane Jews  B12 1037mcg given IM, and pt tolerated injection well.  Next B12 injection scheduled for 09/02/2021

## 2021-08-27 NOTE — Telephone Encounter (Signed)
Patient is calling in to receive results of blood work done on Friday 08/23/2021

## 2021-08-27 NOTE — Telephone Encounter (Signed)
Spoke with patient about lab results.

## 2021-09-02 ENCOUNTER — Ambulatory Visit (INDEPENDENT_AMBULATORY_CARE_PROVIDER_SITE_OTHER): Payer: PPO

## 2021-09-02 DIAGNOSIS — E538 Deficiency of other specified B group vitamins: Secondary | ICD-10-CM

## 2021-09-02 MED ORDER — CYANOCOBALAMIN 1000 MCG/ML IJ SOLN
1000.0000 ug | Freq: Once | INTRAMUSCULAR | Status: AC
  Administered 2021-09-02: 1000 ug via INTRAMUSCULAR

## 2021-09-02 NOTE — Progress Notes (Signed)
Pt here for monthly B12 injection per Dr Sarajane Jews  B12 1077mcg given IM, and pt tolerated injection well.  Next B12 injection scheduled for 09/09/2021

## 2021-09-03 ENCOUNTER — Ambulatory Visit (HOSPITAL_BASED_OUTPATIENT_CLINIC_OR_DEPARTMENT_OTHER): Payer: PPO | Admitting: Internal Medicine

## 2021-09-09 ENCOUNTER — Ambulatory Visit (INDEPENDENT_AMBULATORY_CARE_PROVIDER_SITE_OTHER): Payer: PPO

## 2021-09-09 DIAGNOSIS — E538 Deficiency of other specified B group vitamins: Secondary | ICD-10-CM | POA: Diagnosis not present

## 2021-09-09 MED ORDER — CYANOCOBALAMIN 1000 MCG/ML IJ SOLN
1000.0000 ug | INTRAMUSCULAR | Status: AC
  Administered 2021-09-09 – 2021-11-11 (×6): 1000 ug via INTRAMUSCULAR

## 2021-09-09 NOTE — Progress Notes (Signed)
Pt here for weekly B12 injection #3 of 12 per Dr Sarajane Jews.  B12 1075mcg given IM left deltoid and pt tolerated injection well.  Next B12 injection scheduled for 09/17/21.

## 2021-09-13 ENCOUNTER — Other Ambulatory Visit: Payer: Self-pay | Admitting: Family Medicine

## 2021-09-17 ENCOUNTER — Ambulatory Visit (INDEPENDENT_AMBULATORY_CARE_PROVIDER_SITE_OTHER): Payer: PPO

## 2021-09-17 DIAGNOSIS — E538 Deficiency of other specified B group vitamins: Secondary | ICD-10-CM | POA: Diagnosis not present

## 2021-09-17 NOTE — Progress Notes (Signed)
Pt here for weekly B12 #4 of 12 injection per Dr Sarajane Jews.  B12 1024mcg given IM right deltoid and pt tolerated injection well.  Next B12 injection scheduled for 09/23/21.

## 2021-09-23 ENCOUNTER — Other Ambulatory Visit: Payer: Self-pay | Admitting: Family Medicine

## 2021-09-23 ENCOUNTER — Ambulatory Visit (INDEPENDENT_AMBULATORY_CARE_PROVIDER_SITE_OTHER): Payer: PPO

## 2021-09-23 ENCOUNTER — Other Ambulatory Visit: Payer: Self-pay | Admitting: Internal Medicine

## 2021-09-23 DIAGNOSIS — E538 Deficiency of other specified B group vitamins: Secondary | ICD-10-CM | POA: Diagnosis not present

## 2021-09-23 MED ORDER — CYANOCOBALAMIN 1000 MCG/ML IJ SOLN
1000.0000 ug | Freq: Once | INTRAMUSCULAR | Status: AC
  Administered 2021-09-23: 1000 ug via INTRAMUSCULAR

## 2021-09-23 NOTE — Telephone Encounter (Signed)
Pt LOV was on 08/23/2021 ?Last refill done on 01/23/2021 ?Please advise ?

## 2021-09-23 NOTE — Progress Notes (Signed)
Pt here for monthly B12 injection per Dr Sarajane Jews ? ?B12 1051mcg given IM, and pt tolerated injection well. ? ?Next B12 injection scheduled for 10/07/2021 ? ?

## 2021-10-07 ENCOUNTER — Ambulatory Visit (INDEPENDENT_AMBULATORY_CARE_PROVIDER_SITE_OTHER): Payer: PPO

## 2021-10-07 DIAGNOSIS — E538 Deficiency of other specified B group vitamins: Secondary | ICD-10-CM | POA: Diagnosis not present

## 2021-10-07 NOTE — Progress Notes (Signed)
Pt here for weekly B12 injection #6 of 12 per Dr. Sarajane Jews. ? ?B12 1062mcg given IM right deltoid and pt tolerated injection well. ? ?Next B12 injection scheduled for 10/14/21. ? ?

## 2021-10-09 DIAGNOSIS — J45991 Cough variant asthma: Secondary | ICD-10-CM | POA: Diagnosis not present

## 2021-10-09 DIAGNOSIS — J300 Vasomotor rhinitis: Secondary | ICD-10-CM | POA: Diagnosis not present

## 2021-10-14 ENCOUNTER — Ambulatory Visit: Payer: PPO

## 2021-10-14 ENCOUNTER — Encounter: Payer: Self-pay | Admitting: Family Medicine

## 2021-10-14 ENCOUNTER — Ambulatory Visit (INDEPENDENT_AMBULATORY_CARE_PROVIDER_SITE_OTHER): Payer: PPO | Admitting: Family Medicine

## 2021-10-14 VITALS — BP 130/80 | HR 78 | Temp 98.7°F | Wt 115.0 lb

## 2021-10-14 DIAGNOSIS — E538 Deficiency of other specified B group vitamins: Secondary | ICD-10-CM | POA: Diagnosis not present

## 2021-10-14 MED ORDER — CYANOCOBALAMIN 1000 MCG/ML IJ SOLN
1000.0000 ug | Freq: Once | INTRAMUSCULAR | Status: AC
  Administered 2021-10-14: 1000 ug via INTRAMUSCULAR

## 2021-10-14 NOTE — Progress Notes (Signed)
? ?  Subjective:  ? ? Patient ID: [name], female    DOB: 1945-12-15, 76 y.o.   MRN: [id number] ? ?HPI ?Here for a B12 shot and to ask some questions. Today she will get shot #7 out of 12 planned shots. She already feels a little better but she still gets tired. She has read online that PPI medications can inhibit absorption of B12, and she notes that she had been taking this for 1 year and a half. She stopped taking the Pantoprazole last week, and so far she has not experienced any GERD symptoms.  ? ? ?Review of Systems  ?Constitutional:  Positive for fatigue.  ?Respiratory: Negative.    ?Cardiovascular: Negative.   ?Neurological:  Positive for dizziness.  ? ?   ?Objective:  ? Physical Exam ?Constitutional:   ?   Appearance: Normal appearance. She is not ill-appearing.  ?Cardiovascular:  ?   Rate and Rhythm: Normal rate and regular rhythm.  ?   Pulses: Normal pulses.  ?   Heart sounds: Normal heart sounds.  ?Pulmonary:  ?   Effort: Pulmonary effort is normal.  ?   Breath sounds: Normal breath sounds.  ?Neurological:  ?   General: No focal deficit present.  ?   Mental Status: She is alert and oriented to person, place, and time.  ? ? ? ? ? ?   ?Assessment & Plan:  ?B12 deficiency. I agreed with her about stopping the Pantoprazole. If the GERD symptoms return we can discuss the possibility of trying Pepcid. She is given the B12 shot today.  ?[name], MD ? ? ?

## 2021-10-14 NOTE — Addendum Note (Signed)
Addended by: Wyvonne Lenz on: 10/14/2021 12:14 PM ? ? Modules accepted: Orders ? ?

## 2021-10-21 ENCOUNTER — Ambulatory Visit (INDEPENDENT_AMBULATORY_CARE_PROVIDER_SITE_OTHER): Payer: PPO

## 2021-10-21 DIAGNOSIS — E538 Deficiency of other specified B group vitamins: Secondary | ICD-10-CM

## 2021-10-21 NOTE — Progress Notes (Signed)
Per orders of Laurey Morale, MD, injection of B12 given in   right deltoid by Cathan Gearin D Kamyrah Feeser. ?Patient tolerated injection well. ? ?Lab Results  ?Component Value Date  ? [id number] 246 08/23/2021  ? ? ? ?

## 2021-10-24 DIAGNOSIS — M13831 Other specified arthritis, right wrist: Secondary | ICD-10-CM | POA: Diagnosis not present

## 2021-10-24 DIAGNOSIS — M1812 Unilateral primary osteoarthritis of first carpometacarpal joint, left hand: Secondary | ICD-10-CM | POA: Diagnosis not present

## 2021-10-24 DIAGNOSIS — M13841 Other specified arthritis, right hand: Secondary | ICD-10-CM | POA: Diagnosis not present

## 2021-10-24 DIAGNOSIS — M180 Bilateral primary osteoarthritis of first carpometacarpal joints: Secondary | ICD-10-CM | POA: Diagnosis not present

## 2021-10-24 DIAGNOSIS — R52 Pain, unspecified: Secondary | ICD-10-CM | POA: Diagnosis not present

## 2021-10-28 ENCOUNTER — Ambulatory Visit (INDEPENDENT_AMBULATORY_CARE_PROVIDER_SITE_OTHER): Payer: PPO | Admitting: *Deleted

## 2021-10-28 DIAGNOSIS — E538 Deficiency of other specified B group vitamins: Secondary | ICD-10-CM

## 2021-10-28 MED ORDER — CYANOCOBALAMIN 1000 MCG/ML IJ SOLN
1000.0000 ug | Freq: Once | INTRAMUSCULAR | Status: AC
  Administered 2021-10-28: 1000 ug via INTRAMUSCULAR

## 2021-10-28 NOTE — Progress Notes (Signed)
Pt here for monthly B12 injection per  ? ?B12 1049mcg given IM, and pt tolerated injection well. ? ?Next B12 injection scheduled for x 1 week ? ?

## 2021-11-04 ENCOUNTER — Ambulatory Visit (INDEPENDENT_AMBULATORY_CARE_PROVIDER_SITE_OTHER): Payer: PPO

## 2021-11-04 DIAGNOSIS — E538 Deficiency of other specified B group vitamins: Secondary | ICD-10-CM

## 2021-11-04 NOTE — Progress Notes (Signed)
Pt here for weekly B12 injection #10 of 12 per Dr Sarajane Jews. ? ?B12 1066mcg given IM right deltoid and pt tolerated injection well. ? ?Next B12 injection scheduled for 11/11/21. ? ?

## 2021-11-11 ENCOUNTER — Ambulatory Visit (INDEPENDENT_AMBULATORY_CARE_PROVIDER_SITE_OTHER): Payer: PPO

## 2021-11-11 DIAGNOSIS — E538 Deficiency of other specified B group vitamins: Secondary | ICD-10-CM

## 2021-11-11 NOTE — Progress Notes (Signed)
Pt here for weekly B12 injection #11 of 12 per Dr. Sarajane Jews. ? ?B12 1036mcg given IM left deltoid and pt tolerated injection well. ? ?Next B12 injection scheduled for 11/18/21. ? ?

## 2021-11-18 ENCOUNTER — Ambulatory Visit (INDEPENDENT_AMBULATORY_CARE_PROVIDER_SITE_OTHER): Payer: PPO

## 2021-11-18 DIAGNOSIS — E538 Deficiency of other specified B group vitamins: Secondary | ICD-10-CM | POA: Diagnosis not present

## 2021-11-18 DIAGNOSIS — M7582 Other shoulder lesions, left shoulder: Secondary | ICD-10-CM | POA: Diagnosis not present

## 2021-11-18 MED ORDER — CYANOCOBALAMIN 1000 MCG/ML IJ SOLN
1000.0000 ug | Freq: Once | INTRAMUSCULAR | Status: AC
  Administered 2021-11-18: 1000 ug via INTRAMUSCULAR

## 2021-11-18 NOTE — Progress Notes (Signed)
Pt here for monthly B12 injection per Dr Sarajane Jews ? ?B12 1020mcg given IM, and pt tolerated injection well. ? ?Next B12 injection scheduled for 12/23/2021 ?

## 2021-12-09 DIAGNOSIS — C44329 Squamous cell carcinoma of skin of other parts of face: Secondary | ICD-10-CM | POA: Diagnosis not present

## 2021-12-09 DIAGNOSIS — L821 Other seborrheic keratosis: Secondary | ICD-10-CM | POA: Diagnosis not present

## 2021-12-09 DIAGNOSIS — D485 Neoplasm of uncertain behavior of skin: Secondary | ICD-10-CM | POA: Diagnosis not present

## 2021-12-11 ENCOUNTER — Other Ambulatory Visit: Payer: Self-pay | Admitting: Family Medicine

## 2021-12-19 ENCOUNTER — Other Ambulatory Visit: Payer: Self-pay | Admitting: Internal Medicine

## 2021-12-19 ENCOUNTER — Other Ambulatory Visit: Payer: Self-pay | Admitting: Adult Health

## 2021-12-19 ENCOUNTER — Ambulatory Visit (INDEPENDENT_AMBULATORY_CARE_PROVIDER_SITE_OTHER): Payer: PPO

## 2021-12-19 DIAGNOSIS — E538 Deficiency of other specified B group vitamins: Secondary | ICD-10-CM

## 2021-12-19 MED ORDER — CYANOCOBALAMIN 1000 MCG/ML IJ SOLN
1000.0000 ug | Freq: Once | INTRAMUSCULAR | Status: AC
  Administered 2021-12-19: 1000 ug via INTRAMUSCULAR

## 2021-12-19 NOTE — Progress Notes (Signed)
Per orders of Dr. Fry, injection of Cyanocobalamin 1000 mcg given by Blase Beckner L Lynzi Meulemans. °Patient tolerated injection well.  °

## 2021-12-20 NOTE — Telephone Encounter (Signed)
Okay for refill?    LOV 10/14/2021  Last refill: zolpidem (AMBIEN) 5 MG tablet 30 tablet 5 06/18/2021   Sig:   Take 1 tablet (5 mg total) by mouth at bedtime as needed for sleep.

## 2021-12-23 ENCOUNTER — Ambulatory Visit: Payer: PPO

## 2021-12-30 ENCOUNTER — Telehealth: Payer: Self-pay | Admitting: Family Medicine

## 2021-12-30 DIAGNOSIS — R52 Pain, unspecified: Secondary | ICD-10-CM | POA: Diagnosis not present

## 2021-12-30 DIAGNOSIS — M1811 Unilateral primary osteoarthritis of first carpometacarpal joint, right hand: Secondary | ICD-10-CM | POA: Diagnosis not present

## 2021-12-30 DIAGNOSIS — M13831 Other specified arthritis, right wrist: Secondary | ICD-10-CM | POA: Diagnosis not present

## 2021-12-30 DIAGNOSIS — M13841 Other specified arthritis, right hand: Secondary | ICD-10-CM | POA: Diagnosis not present

## 2021-12-30 NOTE — Telephone Encounter (Signed)
Dr Amedeo Plenty is requesting a conversation with Dr Sarajane Jews regarding patient weight loss, nausea and GI issues.

## 2021-12-30 NOTE — Telephone Encounter (Signed)
I called [facility] and PCP spoke with Dr Amedeo Plenty.  Per PCP I called the patient and scheduled an appt for tomorrow at 9:15am.

## 2021-12-31 ENCOUNTER — Ambulatory Visit (INDEPENDENT_AMBULATORY_CARE_PROVIDER_SITE_OTHER): Payer: PPO | Admitting: Family Medicine

## 2021-12-31 ENCOUNTER — Encounter: Payer: Self-pay | Admitting: Family Medicine

## 2021-12-31 VITALS — BP 128/72 | HR 80 | Temp 98.6°F | Wt 108.4 lb

## 2021-12-31 DIAGNOSIS — E538 Deficiency of other specified B group vitamins: Secondary | ICD-10-CM

## 2021-12-31 DIAGNOSIS — E272 Addisonian crisis: Secondary | ICD-10-CM | POA: Diagnosis not present

## 2021-12-31 DIAGNOSIS — R634 Abnormal weight loss: Secondary | ICD-10-CM

## 2021-12-31 DIAGNOSIS — E871 Hypo-osmolality and hyponatremia: Secondary | ICD-10-CM

## 2021-12-31 LAB — HEMOGLOBIN A1C: Hgb A1c MFr Bld: 5.9 % (ref 4.6–6.5)

## 2021-12-31 LAB — BASIC METABOLIC PANEL
BUN: 9 mg/dL (ref 6–23)
CO2: 27 mEq/L (ref 19–32)
Calcium: 9.4 mg/dL (ref 8.4–10.5)
Chloride: 97 mEq/L (ref 96–112)
Creatinine, Ser: 0.73 mg/dL (ref 0.40–1.20)
GFR: 79.89 mL/min (ref >60.00)
Glucose, Bld: 97 mg/dL (ref 70–99)
Potassium: 4.2 mEq/L (ref 3.5–5.1)
Sodium: 132 mEq/L — ABNORMAL LOW (ref 135–145)

## 2021-12-31 LAB — CBC WITH DIFFERENTIAL/PLATELET
Basophils Absolute: 0.1 10*3/uL (ref 0.0–0.1)
Basophils Relative: 1.1 % (ref 0.0–3.0)
Eosinophils Absolute: 0.1 10*3/uL (ref 0.0–0.7)
Eosinophils Relative: 2.5 % (ref 0.0–5.0)
HCT: 35.9 % — ABNORMAL LOW (ref 36.0–46.0)
Hemoglobin: 12.5 g/dL (ref 12.0–15.0)
Lymphocytes Relative: 11.5 % — ABNORMAL LOW (ref 12.0–46.0)
Lymphs Abs: 0.6 10*3/uL — ABNORMAL LOW (ref 0.7–4.0)
MCHC: 34.7 g/dL (ref 30.0–36.0)
MCV: 96.3 fl (ref 78.0–100.0)
Monocytes Absolute: 0.5 10*3/uL (ref 0.1–1.0)
Monocytes Relative: 10.8 % (ref 3.0–12.0)
Neutro Abs: 3.6 10*3/uL (ref 1.4–7.7)
Neutrophils Relative %: 74.1 % (ref 43.0–77.0)
Platelets: 283 10*3/uL (ref 150.0–400.0)
RBC: 3.73 Mil/uL — ABNORMAL LOW (ref 3.87–5.11)
RDW: 13.7 % (ref 11.5–15.5)
WBC: 4.9 10*3/uL (ref 4.0–10.5)

## 2021-12-31 LAB — HEPATIC FUNCTION PANEL
ALT: 25 U/L (ref 0–35)
AST: 22 U/L (ref 0–37)
Albumin: 3.9 g/dL (ref 3.5–5.2)
Alkaline Phosphatase: 71 U/L (ref 39–117)
Bilirubin, Direct: 0.2 mg/dL (ref 0.0–0.3)
Total Bilirubin: 0.9 mg/dL (ref 0.2–1.2)
Total Protein: 6.9 g/dL (ref 6.0–8.3)

## 2021-12-31 LAB — TSH: TSH: 1.98 u[IU]/mL (ref 0.35–5.50)

## 2021-12-31 LAB — T4, FREE: Free T4: 1.36 ng/dL (ref 0.60–1.60)

## 2021-12-31 LAB — CORTISOL: Cortisol, Plasma: 14.8 ug/dL

## 2021-12-31 LAB — VITAMIN B12: Vitamin B-12: 1257 pg/mL — ABNORMAL HIGH (ref 211–911)

## 2021-12-31 LAB — T3, FREE: T3, Free: 3.4 pg/mL (ref 2.3–4.2)

## 2021-12-31 MED ORDER — NYSTATIN 100000 UNIT/ML MT SUSP: 5.0000 mL | Freq: Four times a day (QID) | OROMUCOSAL | 0 refills | Status: DC

## 2021-12-31 NOTE — Progress Notes (Signed)
Subjective:    Patient ID: [name], female    DOB: 1945/08/16, 76 y.o.   MRN: [id number]  HPI Here with her husband for a number of symptoms that started about a week ago. These include generalized weakness, nausea without vomiting, decreased appetite, lightheadedness, heart palpitations, joint aches, altered taste in the mouth, a white discoloration on the tongue, SOB, 3 small red spots on the right buttock and a 5 lb weight loss. Of note she has been seeing Dr. Roseanne Kaufman for OA in both hands, and he had given her steroid injections with no relief. Then he gave her Celebrex BID for 2 weeks with no improvement. Then he gave her a Prednisone taper, starting with 5 mg daily for 2 weeks, then 2.5 mg daily for one week, and then stopping it. She took her last dose of Prednisone on 12-24-21. Within 4-5 days of stopping it all the symptoms above developed. I spoke to Dr. Amedeo Plenty on the phone yesterday about this, and we agreed to work her in today. She has been drinking water and eating a little yogurt for the past 2 days.    Review of Systems  Constitutional:  Positive for fatigue and unexpected weight change. Negative for fever.  Respiratory:  Positive for shortness of breath. Negative for cough and wheezing.   Cardiovascular:  Positive for palpitations. Negative for chest pain and leg swelling.  Gastrointestinal:  Positive for nausea. Negative for abdominal distention, abdominal pain, blood in stool, constipation, diarrhea and vomiting.  Genitourinary: Negative.   Musculoskeletal:  Positive for arthralgias.  Skin:  Positive for rash.  Neurological:  Positive for weakness and light-headedness. Negative for dizziness, syncope and headaches.       Objective:   Physical Exam Constitutional:      Comments: Frail, appears weak   HENT:     Mouth/Throat:     Pharynx: No posterior oropharyngeal erythema.     Comments: The dorsal tongue has a faint whitish coloration  Cardiovascular:      Rate and Rhythm: Normal rate and regular rhythm.     Pulses: Normal pulses.     Heart sounds: Normal heart sounds.  Pulmonary:     Effort: Pulmonary effort is normal.     Breath sounds: Normal breath sounds.  Abdominal:     General: Abdomen is flat. Bowel sounds are normal. There is no distension.     Palpations: Abdomen is soft. There is no mass.     Tenderness: There is no abdominal tenderness. There is no right CVA tenderness, left CVA tenderness, guarding or rebound.     Hernia: No hernia is present.  Skin:    Comments: Clear except for 3 tiny red vesicles on the right buttock   Neurological:     General: No focal deficit present.     Mental Status: She is alert and oriented to person, place, and time.     Coordination: Coordination normal.     Gait: Gait normal.           Assessment & Plan:  This is consistent with an Addisonian crisis caused by the rapid termination of her recent steroid treatment. This should correct itself over the next 7 to 10 days. We will get labs today to check electrolytes, renal function, etc. We will check a blood cortisol level. I asked her to drink plenty of fluids, with half water and half Gatorade. I also asked her to drink 3 cans of Ensure a day to get  some nutrition back in her. She had a mild case of thrush,so she can use Nystatin oral suspension. This has also triggered a mild case of shingles on her buttock. We elected to not treat this with Valtrex at this time. Recheck in one week. We spent a total of ( 34  ) minutes reviewing records and discussing these issues.  [name], MD

## 2022-01-01 LAB — URINALYSIS
Bilirubin Urine: NEGATIVE
Hgb urine dipstick: NEGATIVE
Ketones, ur: NEGATIVE
Leukocytes,Ua: NEGATIVE
Nitrite: NEGATIVE
Specific Gravity, Urine: 1.01 (ref 1.000–1.030)
Urine Glucose: NEGATIVE
Urobilinogen, UA: 2 — AB (ref 0.0–1.0)
pH: 7 (ref 5.0–8.0)

## 2022-01-01 NOTE — Addendum Note (Signed)
Addended by: Agnes Lawrence on: 01/01/2022 03:57 PM   Modules accepted: Orders

## 2022-01-07 ENCOUNTER — Ambulatory Visit (INDEPENDENT_AMBULATORY_CARE_PROVIDER_SITE_OTHER): Payer: PPO | Admitting: Family Medicine

## 2022-01-07 ENCOUNTER — Encounter: Payer: Self-pay | Admitting: Family Medicine

## 2022-01-07 VITALS — BP 120/58 | HR 98 | Temp 98.4°F | Ht 66.0 in | Wt 111.8 lb

## 2022-01-07 DIAGNOSIS — F411 Generalized anxiety disorder: Secondary | ICD-10-CM | POA: Diagnosis not present

## 2022-01-07 DIAGNOSIS — E538 Deficiency of other specified B group vitamins: Secondary | ICD-10-CM

## 2022-01-07 DIAGNOSIS — E871 Hypo-osmolality and hyponatremia: Secondary | ICD-10-CM | POA: Diagnosis not present

## 2022-01-07 DIAGNOSIS — E272 Addisonian crisis: Secondary | ICD-10-CM

## 2022-01-07 LAB — BASIC METABOLIC PANEL
BUN: 15 mg/dL (ref 6–23)
CO2: 29 mEq/L (ref 19–32)
Calcium: 9.2 mg/dL (ref 8.4–10.5)
Chloride: 99 mEq/L (ref 96–112)
Creatinine, Ser: 0.65 mg/dL (ref 0.40–1.20)
GFR: 85.52 mL/min (ref >60.00)
Glucose, Bld: 129 mg/dL — ABNORMAL HIGH (ref 70–99)
Potassium: 3.9 mEq/L (ref 3.5–5.1)
Sodium: 134 mEq/L — ABNORMAL LOW (ref 135–145)

## 2022-01-07 NOTE — Progress Notes (Signed)
   Subjective:    Patient ID: [name], female    DOB: 03/02/1946, 76 y.o.   MRN: [id number]  HPI Here with her husband to follow up on a mild Addisonian episode when she came off Prednisone 2 weeks ago. She saw [place] here on 12-31-21 complaining of multiple symptoms like weakness, nausea, decreased appetite, palpitations, SOB, joint pains, and rashes. She had lost 5 lbs in the previous several weeks. We did labs that day which were remarkable only for a low sodium at 132. We advised her to drink 3 cans of Ensure a day and to add Pedialyte to that, and we told her this should resolve on its own in the next week or two. Since that day she feels somewhat better, and her general strength is back up a little. She has gained 3 lbs since that visit. Today here main complaint is "nervousness", and her husband agrees that she has been struggling with anxiety. She has been given Xanax 0.5 mg to take TID as needed, but the most she has ever taken was 1/2 tablet once a day. Interestingly she says 2 evenings ago she took 1/2 tablet of Xanax just before they went out to eat, and she felt hungry for the first time in several weeks. The SOB has resolved but she says that when she feels the anxiety building up inside her she may feel some mild chest pressure. No chest pain. Because of this she has started taking one 20 mg Famotidine OTC a day, and this has gone away.    Review of Systems  Constitutional:  Positive for appetite change and fatigue. Negative for fever.  Respiratory:  Positive for chest tightness. Negative for cough, shortness of breath and wheezing.   Cardiovascular: Negative.   Gastrointestinal:  Positive for nausea. Negative for abdominal distention, abdominal pain, constipation, diarrhea and vomiting.  Genitourinary: Negative.   Psychiatric/Behavioral:  Positive for decreased concentration and sleep disturbance. Negative for agitation, confusion, dysphoric mood and hallucinations. The patient is  nervous/anxious.        Objective:   Physical Exam Constitutional:      General: She is not in acute distress. Cardiovascular:     Rate and Rhythm: Normal rate and regular rhythm.     Pulses: Normal pulses.     Heart sounds: Normal heart sounds.  Pulmonary:     Effort: Pulmonary effort is normal.     Breath sounds: Normal breath sounds.  Neurological:     General: No focal deficit present.     Mental Status: She is alert and oriented to person, place, and time.  Psychiatric:        Behavior: Behavior normal.        Thought Content: Thought content normal.     Comments: She is very anxious and her hands are trembling at times            Assessment & Plan:  It seems she is recovering from the Addison's episode as expected, but now her main problem is anxiety. She is hesitant to take the Xanax, but I urged her to do so. She agreed to take a full tablet of Xanax BID for the next week or two, and she will follow up with [place] after that. We will get another BMET today to follow the sodium level. We spent a total of (35   ) minutes reviewing records and discussing these issues.  [name], MD

## 2022-01-08 ENCOUNTER — Other Ambulatory Visit: Payer: PPO

## 2022-01-16 ENCOUNTER — Ambulatory Visit (INDEPENDENT_AMBULATORY_CARE_PROVIDER_SITE_OTHER): Payer: PPO | Admitting: *Deleted

## 2022-01-16 DIAGNOSIS — E538 Deficiency of other specified B group vitamins: Secondary | ICD-10-CM

## 2022-01-16 MED ORDER — CYANOCOBALAMIN 1000 MCG/ML IJ SOLN
1000.0000 ug | Freq: Once | INTRAMUSCULAR | Status: AC
  Administered 2022-01-16: 1000 ug via INTRAMUSCULAR

## 2022-01-16 NOTE — Progress Notes (Signed)
Per orders of Dr. Hernandez, injection of Cyanocobalamin 1000mcg given by Elchanan Bob A. Patient tolerated injection well. 

## 2022-01-20 ENCOUNTER — Ambulatory Visit (INDEPENDENT_AMBULATORY_CARE_PROVIDER_SITE_OTHER): Payer: PPO | Admitting: Family Medicine

## 2022-01-20 ENCOUNTER — Encounter: Payer: Self-pay | Admitting: Family Medicine

## 2022-01-20 VITALS — BP 120/60 | HR 84 | Temp 98.5°F | Wt 115.0 lb

## 2022-01-20 DIAGNOSIS — R634 Abnormal weight loss: Secondary | ICD-10-CM

## 2022-01-20 DIAGNOSIS — E272 Addisonian crisis: Secondary | ICD-10-CM

## 2022-01-20 DIAGNOSIS — J449 Chronic obstructive pulmonary disease, unspecified: Secondary | ICD-10-CM | POA: Diagnosis not present

## 2022-01-20 DIAGNOSIS — F411 Generalized anxiety disorder: Secondary | ICD-10-CM

## 2022-01-20 DIAGNOSIS — R0602 Shortness of breath: Secondary | ICD-10-CM | POA: Diagnosis not present

## 2022-01-20 NOTE — Progress Notes (Signed)
   Subjective:    Patient ID: [name], female    DOB: Oct 28, 1945, 76 y.o.   MRN: [id number]  HPI Here to follow up on weakness, SOB, and anxiety. She has had tremendous improvement since we last saw her. She is taking 3 cans of Ensure a day, and her appetite is coming back. She has gained 4 lbs since we last saw her. She feels more energy and the SOB is no longer an issue. Her anxiety is better controlled with Xanax twice a day.    Review of Systems  Constitutional: Negative.   Respiratory: Negative.    Cardiovascular: Negative.   Neurological: Negative.   Psychiatric/Behavioral: Negative.         Objective:   Physical Exam Constitutional:      Appearance: Normal appearance.  Cardiovascular:     Rate and Rhythm: Normal rate and regular rhythm.     Pulses: Normal pulses.     Heart sounds: Normal heart sounds.  Pulmonary:     Effort: Pulmonary effort is normal.     Breath sounds: Normal breath sounds.  Neurological:     General: No focal deficit present.     Mental Status: She is alert and oriented to person, place, and time.  Psychiatric:        Mood and Affect: Mood normal.        Behavior: Behavior normal.        Thought Content: Thought content normal.           Assessment & Plan:  She is doing very well. The Addisonian episode has resolved. She is gaining weight and she will continue the Ensure shakes, but we will cut these down to 2 a day. The anxiety has greatly improved, but she will stay on Xanax for the time being. Recheck as needed.  [name], MD

## 2022-02-06 ENCOUNTER — Encounter: Payer: Self-pay | Admitting: Internal Medicine

## 2022-02-13 ENCOUNTER — Ambulatory Visit: Payer: PPO

## 2022-02-13 ENCOUNTER — Ambulatory Visit (INDEPENDENT_AMBULATORY_CARE_PROVIDER_SITE_OTHER): Payer: PPO | Admitting: *Deleted

## 2022-02-13 DIAGNOSIS — E538 Deficiency of other specified B group vitamins: Secondary | ICD-10-CM | POA: Diagnosis not present

## 2022-02-13 MED ORDER — CYANOCOBALAMIN 1000 MCG/ML IJ SOLN
1000.0000 ug | Freq: Once | INTRAMUSCULAR | Status: AC
  Administered 2022-02-13: 1000 ug via INTRAMUSCULAR

## 2022-02-13 NOTE — Progress Notes (Signed)
Per orders of Dr. Fry, injection of Cyanocobalamin 1000mcg given by Miarose Lippert A. Patient tolerated injection well.  

## 2022-02-20 DIAGNOSIS — Z1231 Encounter for screening mammogram for malignant neoplasm of breast: Secondary | ICD-10-CM | POA: Diagnosis not present

## 2022-02-20 LAB — HM MAMMOGRAPHY

## 2022-03-07 ENCOUNTER — Encounter: Payer: Self-pay | Admitting: Family Medicine

## 2022-03-11 ENCOUNTER — Ambulatory Visit (INDEPENDENT_AMBULATORY_CARE_PROVIDER_SITE_OTHER): Payer: PPO

## 2022-03-11 ENCOUNTER — Ambulatory Visit: Payer: PPO

## 2022-03-11 VITALS — BP 126/62 | HR 69 | Temp 97.9°F | Ht 66.0 in | Wt 117.4 lb

## 2022-03-11 DIAGNOSIS — Z0000 Encounter for general adult medical examination without abnormal findings: Secondary | ICD-10-CM

## 2022-03-11 DIAGNOSIS — E538 Deficiency of other specified B group vitamins: Secondary | ICD-10-CM | POA: Diagnosis not present

## 2022-03-11 MED ORDER — CYANOCOBALAMIN 1000 MCG/ML IJ SOLN
1000.0000 ug | Freq: Once | INTRAMUSCULAR | Status: AC
  Administered 2022-03-11: 1000 ug via INTRAMUSCULAR

## 2022-03-11 NOTE — Progress Notes (Signed)
Pt here for monthly B12 injection per Dr Sarajane Jews.  B12 1079mcg given IM, and pt tolerated injection well.  Next B12 injection scheduled for next month.

## 2022-03-11 NOTE — Progress Notes (Signed)
Subjective:   [name] is a 76 y.o. female who presents for Medicare Annual (Subsequent) preventive examination.  Review of Systems     Cardiac Risk Factors include: advanced age (>81men, >79 women);dyslipidemia;hypertension     Objective:    Today's Vitals   03/11/22 0832 03/11/22 0837  BP: 126/62   Pulse: 69   Temp: 97.9 F (36.6 C)   TempSrc: Oral   SpO2: 99%   Weight: 117 lb 6.4 oz (53.3 kg)   Height: 5\' 6"  (1.676 m)   PainSc:  3    Body mass index is 18.95 kg/m.     03/11/2022    8:48 AM 04/09/2020   10:15 AM 10/13/2016    8:00 AM  Advanced Directives  Does Patient Have a Medical Advance Directive? No No No  Would patient like information on creating a medical advance directive? No - Patient declined No - Patient declined     Current Medications (verified) Outpatient Encounter Medications as of 03/11/2022  Medication Sig   ALPRAZolam (XANAX) 0.5 MG tablet TAKE 1 TABLET(0.5 MG) BY MOUTH TWICE DAILY AS NEEDED FOR ANXIETY OR SLEEP   cetirizine (ZYRTEC) 10 MG tablet Take 10 mg by mouth daily as needed.    famotidine (PEPCID) 20 MG tablet Take 20 mg by mouth daily.   metoprolol tartrate (LOPRESSOR) 25 MG tablet TAKE 1 TABLET(25 MG) BY MOUTH TWICE DAILY   zolpidem (AMBIEN) 5 MG tablet TAKE 1 TABLET(5 MG) BY MOUTH AT BEDTIME AS NEEDED FOR SLEEP   rosuvastatin (CRESTOR) 20 MG tablet Take 1 tablet (20 mg total) by mouth daily.   [DISCONTINUED] nystatin (MYCOSTATIN) 100000 UNIT/ML suspension Take 5 mLs (500,000 Units total) by mouth 4 (four) times daily.   No facility-administered encounter medications on file as of 03/11/2022.    Allergies (verified) Codeine phosphate, Ephedrine hcl, Lipitor [atorvastatin], and Penicillins   History: Past Medical History:  Diagnosis Date   Acute upper respiratory infections of unspecified site    Allergy    ANXIETY 10/12/2008   Arthritis    Cancer ([place])    breast- right   COPD 03/26/2009   pt denies this   GERD  (gastroesophageal reflux disease)    HERPES ZOSTER 10/12/2008   Hyperlipidemia    Hypertension    Lumbago    Palpitations    Syncopal episodes 8/10   "presyncopal" prbly vasovagal-MI ruled out; EKG normal; telemetry with PVC's/PAC's. Stress echo-no evidence for ischemia. EF 65-70%. No valvular abnormalities, no pulm. HTN   Urinary tract infection, site not specified    Past Surgical History:  Procedure Laterality Date   BREAST SURGERY     2004- pt denies lymph node removal   COLONOSCOPY  04/20/2003   per Dr. Henrene Pastor, clear. Cologuard 01-24-19 negative (repeat in 3 yrs)    OOPHORECTOMY     ROTATOR CUFF REPAIR Left 2014   Family History  Problem Relation Age of Onset   Dementia Mother    Alzheimer's disease Mother    Parkinsonism Father    Diabetes Brother    Colon polyps Neg Hx    Esophageal cancer Neg Hx    Rectal cancer Neg Hx    Stomach cancer Neg Hx    Social History   Socioeconomic History   Marital status: Married    Spouse name: Not on file   Number of children: 2   Years of education: Not on file   Highest education level: Not on file  Occupational History   Occupation: Part-time  preschool teacher    Employer: [employer]  Tobacco Use   Smoking status: Former   Smokeless tobacco: Never  Vaping Use   Vaping Use: Never used  Substance and Sexual Activity   Alcohol use: Yes    Alcohol/week: 7.0 - 14.0 standard drinks of alcohol    Types: 7 - 14 Glasses of wine per week    Comment: has a cocktail occasionally during the week   Drug use: No   Sexual activity: Not on file  Other Topics Concern   Not on file  Social History Narrative   Not on file   Social Determinants of Health   Financial Resource Strain: Low Risk  (03/11/2022)   Overall Financial Resource Strain (CARDIA)    Difficulty of Paying Living Expenses: Not hard at all  Food Insecurity: No Food Insecurity (03/11/2022)   Hunger Vital Sign    Worried About Running Out of Food in the Last Year:  Never true    Ran Out of Food in the Last Year: Never true  Transportation Needs: No Transportation Needs (03/11/2022)   PRAPARE - Hydrologist (Medical): No    Lack of Transportation (Non-Medical): No  Physical Activity: Inactive (03/11/2022)   Exercise Vital Sign    Days of Exercise per Week: 0 days    Minutes of Exercise per Session: 0 min  Stress: No Stress Concern Present (03/11/2022)   [place]    Feeling of Stress : Only a little  Social Connections: Not on file    Tobacco Counseling Counseling given: Not Answered   Clinical Intake:  Pre-visit preparation completed: Yes  Pain : 0-10 Pain Score: 3  Pain Type: Chronic pain Pain Location: Wrist Pain Orientation: Right Pain Descriptors / Indicators: Aching Pain Onset: More than a month ago Pain Frequency: Constant Pain Relieving Factors: ice, brace, voltaren gel  Pain Relieving Factors: ice, brace, voltaren gel  Nutritional Status: BMI of 19-24  Normal Nutritional Risks: None Diabetes: No  How often do you need to have someone help you when you read instructions, pamphlets, or other written materials from your doctor or pharmacy?: 1 - Never What is the last grade level you completed in school?: college  Diabetic? no  Interpreter Needed?: No  Information entered by :: NAllen LPN   Activities of Daily Living    03/11/2022    8:52 AM  In your present state of health, do you have any difficulty performing the following activities:  Hearing? 0  Vision? 0  Difficulty concentrating or making decisions? 0  Walking or climbing stairs? 0  Dressing or bathing? 0  Doing errands, shopping? 0  Preparing Food and eating ? N  Using the Toilet? N  In the past six months, have you accidently leaked urine? Y  Comment held a little to long  Do you have problems with loss of bowel control? N  Managing your Medications? N   Managing your Finances? N  Housekeeping or managing your Housekeeping? N    Patient Care Team: Laurey Morale, MD as PCP - General (Family Medicine)  Indicate any recent Medical Services you may have received from other than [facility] providers in the past year (date may be approximate).     Assessment:   This is a routine wellness examination for [place].  Hearing/Vision screen Vision Screening - Comments:: No regular eye exams,   Dietary issues and exercise activities discussed: Current Exercise Habits:  The patient does not participate in regular exercise at present   Goals Addressed             This Visit's Progress    Patient Stated       03/11/2022, wants to feel normal       Depression Screen    03/11/2022    8:51 AM 12/31/2021    9:35 AM 10/14/2021   10:39 AM 08/23/2021    9:46 AM 01/23/2021   11:45 AM 09/03/2015    1:32 PM 06/12/2014    3:25 PM  PHQ 2/9 Scores  PHQ - 2 Score 0 6 0 2 0 0 0  PHQ- 9 Score  12 4 2 1       Fall Risk    03/11/2022    8:51 AM 10/14/2021   10:39 AM 09/03/2015    1:32 PM 06/12/2014    3:25 PM 06/02/2013    4:14 PM  Fall Risk   Falls in the past year? 0 0 No No No  Number falls in past yr: 0 0     Injury with Fall? 0 0     Risk for fall due to : Medication side effect No Fall Risks     Follow up Falls evaluation completed;Education provided;Falls prevention discussed Falls evaluation completed       FALL RISK PREVENTION PERTAINING TO THE HOME:  Any stairs in or around the home? Yes  If so, are there any without handrails? No  Home free of loose throw rugs in walkways, pet beds, electrical cords, etc? Yes  Adequate lighting in your home to reduce risk of falls? Yes   ASSISTIVE DEVICES UTILIZED TO PREVENT FALLS:  Life alert? No  Use of a cane, walker or w/c? No  Grab bars in the bathroom? No  Shower chair or bench in shower? No  Elevated toilet seat or a handicapped toilet? No   TIMED UP AND GO:  Was the test performed? No .     Gait steady and fast without use of assistive device  Cognitive Function:        03/11/2022    8:57 AM  6CIT Screen  What Year? 0 points  What month? 0 points  What time? 0 points  Count back from 20 0 points  Months in reverse 0 points  Repeat phrase 2 points  Total Score 2 points    Immunizations Immunization History  Administered Date(s) Administered   Influenza Split 05/19/2011, 05/25/2012   Influenza Whole 05/24/2007   Influenza, High Dose Seasonal PF 06/02/2013, 06/12/2014, 06/07/2015, 06/06/2018   Influenza-Unspecified 07/16/2017, 05/21/2021   PFIZER(Purple Top)SARS-COV-2 Vaccination 08/05/2019, 08/24/2019, 04/30/2020, 10/03/2020   Pneumococcal Conjugate-13 06/12/2014   Pneumococcal Polysaccharide-23 05/24/2007, 05/30/2011, 06/07/2018, 10/03/2020   Tdap 05/30/2011    TDAP status: Due, Education has been provided regarding the importance of this vaccine. Advised may receive this vaccine at local pharmacy or Health Dept. Aware to provide a copy of the vaccination record if obtained from local pharmacy or Health Dept. Verbalized acceptance and understanding.  Flu Vaccine status: Due, Education has been provided regarding the importance of this vaccine. Advised may receive this vaccine at local pharmacy or Health Dept. Aware to provide a copy of the vaccination record if obtained from local pharmacy or Health Dept. Verbalized acceptance and understanding.  Pneumococcal vaccine status: Up to date  Covid-19 vaccine status: Completed vaccines  Qualifies for Shingles Vaccine? Yes   Zostavax completed No   Shingrix Completed?: No.    Education  has been provided regarding the importance of this vaccine. Patient has been advised to call insurance company to determine out of pocket expense if they have not yet received this vaccine. Advised may also receive vaccine at local pharmacy or Health Dept. Verbalized acceptance and understanding.  Screening Tests Health  Maintenance  Topic Date Due   Hepatitis C Screening  Never done   Zoster Vaccines- Shingrix (1 of 2) Never done   COVID-19 Vaccine (5 - Pfizer series) 11/28/2020   TETANUS/TDAP  05/29/2021   INFLUENZA VACCINE  02/18/2022   MAMMOGRAM  02/21/2023   Pneumonia Vaccine 44+ Years old  Completed   DEXA SCAN  Completed   HPV VACCINES  Aged Out   COLONOSCOPY (Pts 45-62yrs Insurance coverage will need to be confirmed)  Discontinued    Health Maintenance  Health Maintenance Due  Topic Date Due   Hepatitis C Screening  Never done   Zoster Vaccines- Shingrix (1 of 2) Never done   COVID-19 Vaccine (5 - Pfizer series) 11/28/2020   TETANUS/TDAP  05/29/2021   INFLUENZA VACCINE  02/18/2022    Colorectal cancer screening: No longer required.   Mammogram status: Completed 02/20/2022. Repeat every year  Bone Density status: Completed 12/22/2019.   Lung Cancer Screening: (Low Dose CT Chest recommended if Age 90-80 years, 30 pack-year currently smoking OR have quit w/in 15years.) does not qualify.   Lung Cancer Screening Referral: no  Additional Screening:  Hepatitis C Screening: does qualify;   Vision Screening: Recommended annual ophthalmology exams for early detection of glaucoma and other disorders of the eye. Is the patient up to date with their annual eye exam?  No  Who is the provider or what is the name of the office in which the patient attends annual eye exams? none If pt is not established with a provider, would they like to be referred to a provider to establish care? No .   Dental Screening: Recommended annual dental exams for proper oral hygiene  Community Resource Referral / Chronic Care Management: CRR required this visit?  No   CCM required this visit?  No      Plan:     I have personally reviewed and noted the following in the patient's chart:   Medical and social history Use of alcohol, tobacco or illicit drugs  Current medications and supplements including opioid  prescriptions. Patient is not currently taking opioid prescriptions. Functional ability and status Nutritional status Physical activity Advanced directives List of other physicians Hospitalizations, surgeries, and ER visits in previous 12 months Vitals Screenings to include cognitive, depression, and falls Referrals and appointments  In addition, I have reviewed and discussed with patient certain preventive protocols, quality metrics, and best practice recommendations. A written personalized care plan for preventive services as well as general preventive health recommendations were provided to patient.     Kellie Simmering, LPN   [id number]   Nurse Notes: none

## 2022-03-11 NOTE — Patient Instructions (Signed)
[name] , Thank you for taking time to come for your Medicare Wellness Visit. I appreciate your ongoing commitment to your health goals. Please review the following plan we discussed and let me know if I can assist you in the future.   Screening recommendations/referrals: Colonoscopy: not required Mammogram: completed 02/20/2022, due 02/22/2023 Bone Density: completed 12/22/2019 Recommended yearly ophthalmology/optometry visit for glaucoma screening and checkup Recommended yearly dental visit for hygiene and checkup  Vaccinations: Influenza vaccine: due Pneumococcal vaccine: completed 10/03/2020 Tdap vaccine: due Shingles vaccine: discussed   Covid-19: 10/03/2020, 04/30/2020, 08/24/2019, 08/05/2019  Advanced directives: Advance directive discussed with you today. Even though you declined this today please call our office should you change your mind and we can give you the proper paperwork for you to fill out.  Conditions/risks identified: none  Next appointment: Follow up in one year for your annual wellness visit    Preventive Care 65 Years and Older, Female Preventive care refers to lifestyle choices and visits with your health care provider that can promote health and wellness. What does preventive care include? A yearly physical exam. This is also called an annual well check. Dental exams once or twice a year. Routine eye exams. Ask your health care provider how often you should have your eyes checked. Personal lifestyle choices, including: Daily care of your teeth and gums. Regular physical activity. Eating a healthy diet. Avoiding tobacco and drug use. Limiting alcohol use. Practicing safe sex. Taking low-dose aspirin every day. Taking vitamin and mineral supplements as recommended by your health care provider. What happens during an annual well check? The services and screenings done by your health care provider during your annual well check will depend on your age, overall  health, lifestyle risk factors, and family history of disease. Counseling  Your health care provider may ask you questions about your: Alcohol use. Tobacco use. Drug use. Emotional well-being. Home and relationship well-being. Sexual activity. Eating habits. History of falls. Memory and ability to understand (cognition). Work and work Statistician. Reproductive health. Screening  You may have the following tests or measurements: Height, weight, and BMI. Blood pressure. Lipid and cholesterol levels. These may be checked every 5 years, or more frequently if you are over 87 years old. Skin check. Lung cancer screening. You may have this screening every year starting at age 2 if you have a 30-pack-year history of smoking and currently smoke or have quit within the past 15 years. Fecal occult blood test (FOBT) of the stool. You may have this test every year starting at age 34. Flexible sigmoidoscopy or colonoscopy. You may have a sigmoidoscopy every 5 years or a colonoscopy every 10 years starting at age 69. Hepatitis C blood test. Hepatitis B blood test. Sexually transmitted disease (STD) testing. Diabetes screening. This is done by checking your blood sugar (glucose) after you have not eaten for a while (fasting). You may have this done every 1-3 years. Bone density scan. This is done to screen for osteoporosis. You may have this done starting at age 63. Mammogram. This may be done every 1-2 years. Talk to your health care provider about how often you should have regular mammograms. Talk with your health care provider about your test results, treatment options, and if necessary, the need for more tests. Vaccines  Your health care provider may recommend certain vaccines, such as: Influenza vaccine. This is recommended every year. Tetanus, diphtheria, and acellular pertussis (Tdap, Td) vaccine. You may need a Td booster every 10 years. Zoster  vaccine. You may need this after age  53. Pneumococcal 13-valent conjugate (PCV13) vaccine. One dose is recommended after age 62. Pneumococcal polysaccharide (PPSV23) vaccine. One dose is recommended after age 72. Talk to your health care provider about which screenings and vaccines you need and how often you need them. This information is not intended to replace advice given to you by your health care provider. Make sure you discuss any questions you have with your health care provider. Document Released: 08/03/2015 Document Revised: 03/26/2016 Document Reviewed: 05/08/2015 Elsevier Interactive Patient Education  2017 [place] Prevention in the Home Falls can cause injuries. They can happen to people of all ages. There are many things you can do to make your home safe and to help prevent falls. What can I do on the outside of my home? Regularly fix the edges of walkways and driveways and fix any cracks. Remove anything that might make you trip as you walk through a door, such as a raised step or threshold. Trim any bushes or trees on the path to your home. Use bright outdoor lighting. Clear any walking paths of anything that might make someone trip, such as rocks or tools. Regularly check to see if handrails are loose or broken. Make sure that both sides of any steps have handrails. Any raised decks and porches should have guardrails on the edges. Have any leaves, snow, or ice cleared regularly. Use sand or salt on walking paths during winter. Clean up any spills in your garage right away. This includes oil or grease spills. What can I do in the bathroom? Use night lights. Install grab bars by the toilet and in the tub and shower. Do not use towel bars as grab bars. Use non-skid mats or decals in the tub or shower. If you need to sit down in the shower, use a plastic, non-slip stool. Keep the floor dry. Clean up any water that spills on the floor as soon as it happens. Remove soap buildup in the tub or shower  regularly. Attach bath mats securely with double-sided non-slip rug tape. Do not have throw rugs and other things on the floor that can make you trip. What can I do in the bedroom? Use night lights. Make sure that you have a light by your bed that is easy to reach. Do not use any sheets or blankets that are too big for your bed. They should not hang down onto the floor. Have a firm chair that has side arms. You can use this for support while you get dressed. Do not have throw rugs and other things on the floor that can make you trip. What can I do in the kitchen? Clean up any spills right away. Avoid walking on wet floors. Keep items that you use a lot in easy-to-reach places. If you need to reach something above you, use a strong step stool that has a grab bar. Keep electrical cords out of the way. Do not use floor polish or wax that makes floors slippery. If you must use wax, use non-skid floor wax. Do not have throw rugs and other things on the floor that can make you trip. What can I do with my stairs? Do not leave any items on the stairs. Make sure that there are handrails on both sides of the stairs and use them. Fix handrails that are broken or loose. Make sure that handrails are as long as the stairways. Check any carpeting to make sure that it  is firmly attached to the stairs. Fix any carpet that is loose or worn. Avoid having throw rugs at the top or bottom of the stairs. If you do have throw rugs, attach them to the floor with carpet tape. Make sure that you have a light switch at the top of the stairs and the bottom of the stairs. If you do not have them, ask someone to add them for you. What else can I do to help prevent falls? Wear shoes that: Do not have high heels. Have rubber bottoms. Are comfortable and fit you well. Are closed at the toe. Do not wear sandals. If you use a stepladder: Make sure that it is fully opened. Do not climb a closed stepladder. Make sure that  both sides of the stepladder are locked into place. Ask someone to hold it for you, if possible. Clearly mark and make sure that you can see: Any grab bars or handrails. First and last steps. Where the edge of each step is. Use tools that help you move around (mobility aids) if they are needed. These include: Canes. Walkers. Scooters. Crutches. Turn on the lights when you go into a dark area. Replace any light bulbs as soon as they burn out. Set up your furniture so you have a clear path. Avoid moving your furniture around. If any of your floors are uneven, fix them. If there are any pets around you, be aware of where they are. Review your medicines with your doctor. Some medicines can make you feel dizzy. This can increase your chance of falling. Ask your doctor what other things that you can do to help prevent falls. This information is not intended to replace advice given to you by your health care provider. Make sure you discuss any questions you have with your health care provider. Document Released: 05/03/2009 Document Revised: 12/13/2015 Document Reviewed: 08/11/2014 Elsevier Interactive Patient Education  2017 [employer].

## 2022-03-13 DIAGNOSIS — M7061 Trochanteric bursitis, right hip: Secondary | ICD-10-CM | POA: Diagnosis not present

## 2022-03-13 DIAGNOSIS — M25561 Pain in right knee: Secondary | ICD-10-CM | POA: Diagnosis not present

## 2022-03-14 ENCOUNTER — Other Ambulatory Visit: Payer: Self-pay | Admitting: Family Medicine

## 2022-04-03 ENCOUNTER — Other Ambulatory Visit: Payer: Self-pay | Admitting: Family Medicine

## 2022-04-03 NOTE — Telephone Encounter (Signed)
Last refill-09/23/21-60 tabs, 5 refills Last OV-01-20-2022  No future OV scheduled.

## 2022-04-09 ENCOUNTER — Ambulatory Visit (INDEPENDENT_AMBULATORY_CARE_PROVIDER_SITE_OTHER): Payer: PPO

## 2022-04-09 DIAGNOSIS — E538 Deficiency of other specified B group vitamins: Secondary | ICD-10-CM | POA: Diagnosis not present

## 2022-04-09 MED ORDER — CYANOCOBALAMIN 1000 MCG/ML IJ SOLN
1000.0000 ug | Freq: Once | INTRAMUSCULAR | Status: AC
  Administered 2022-04-09: 1000 ug via INTRAMUSCULAR

## 2022-04-09 NOTE — Progress Notes (Signed)
Pt here for monthly B12 injection per Dr Sarajane Jews  B12 1044mcg given IM on the Left deltoid, and pt tolerated injection well.  Next B12 injection scheduled for 05/07/2022.

## 2022-04-17 ENCOUNTER — Telehealth: Payer: Self-pay | Admitting: Internal Medicine

## 2022-04-17 ENCOUNTER — Other Ambulatory Visit (HOSPITAL_BASED_OUTPATIENT_CLINIC_OR_DEPARTMENT_OTHER): Payer: Self-pay | Admitting: Internal Medicine

## 2022-04-17 MED ORDER — ROSUVASTATIN CALCIUM 20 MG PO TABS: ORAL_TABLET | ORAL | 1 refills | Status: DC

## 2022-04-17 NOTE — Telephone Encounter (Signed)
*  STAT* If patient is at the pharmacy, call can be transferred to refill team.   1. Which medications need to be refilled? (please list name of each medication and dose if known) rosuvastatin (CRESTOR) 20 MG tablet  2. Which pharmacy/location (including street and city if local pharmacy) is medication to be sent to? [place], [place]  3. Do they need a 30 day or 90 day supply? 90 day   Patient runs out of medication Saturday.

## 2022-04-27 IMAGING — CT CT ABD-PELV W/ CM
2 of 5 series · 16 of 46 positions shown, 18 images · IV contrast (OMNIPAQUE 300)
Comparison: None.

CLINICAL DATA: Abdominal pain, nausea

EXAM:
CT ABDOMEN AND PELVIS WITH CONTRAST
TECHNIQUE: Multidetector CT imaging of the abdomen and pelvis was performed
using the standard protocol following bolus administration of
intravenous contrast.
CONTRAST:  100mL OMNIPAQUE IOHEXOL 300 MG/ML  SOLN

[Series 2: axial st · axial · 0.70mm/px · z∈[-654,-309]mm · 13 of 79 slices shown, 15 images]
[im 5/79  soft-tissue]
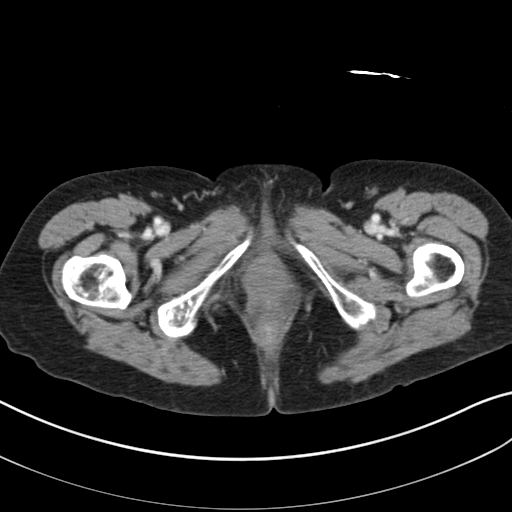
[im 5/79  bone]
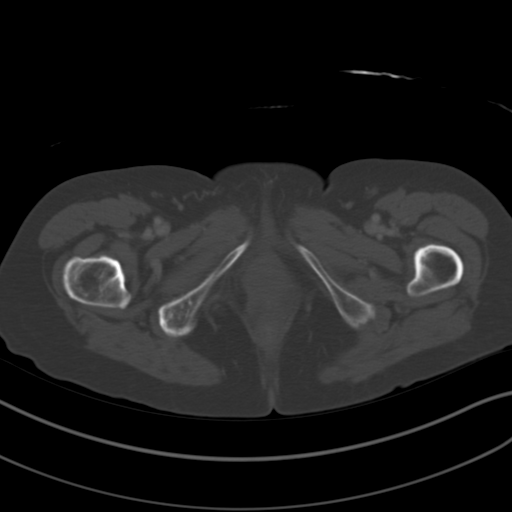
[im 9/79  soft-tissue]
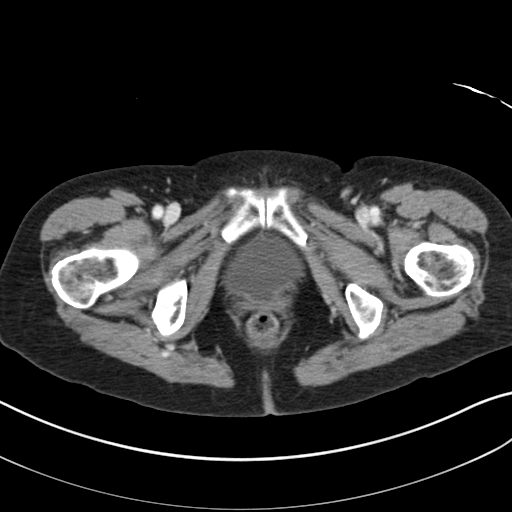
[im 18/79  soft-tissue]
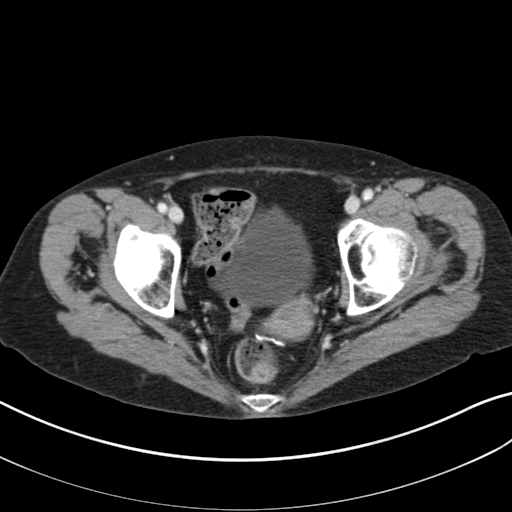
[im 22/79  soft-tissue]
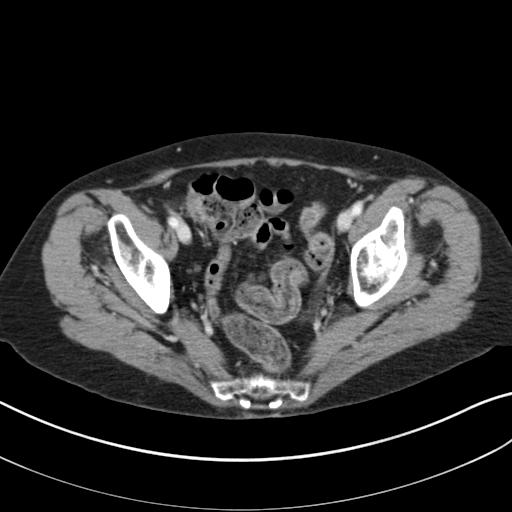
[im 27/79  soft-tissue]
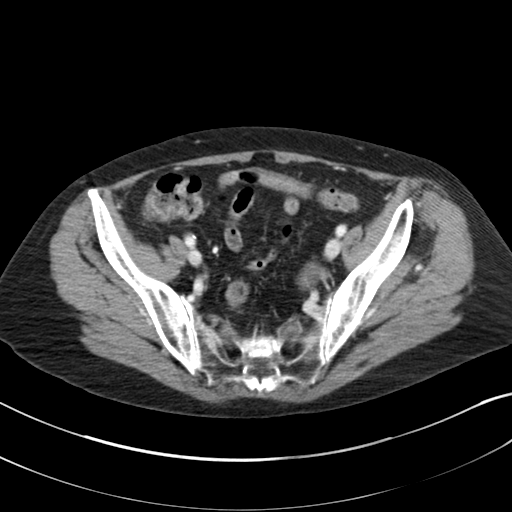
[im 35/79  soft-tissue]
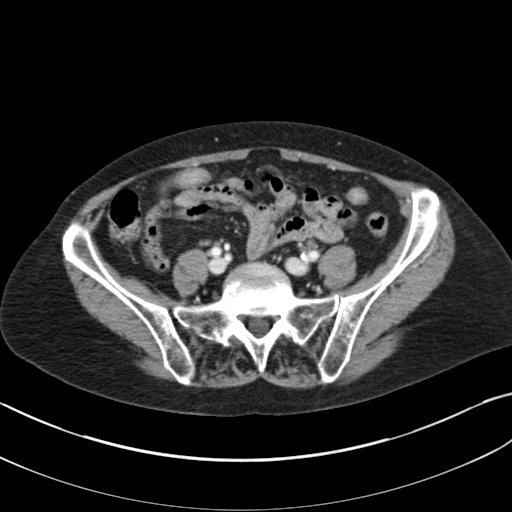
[im 40/79  soft-tissue]
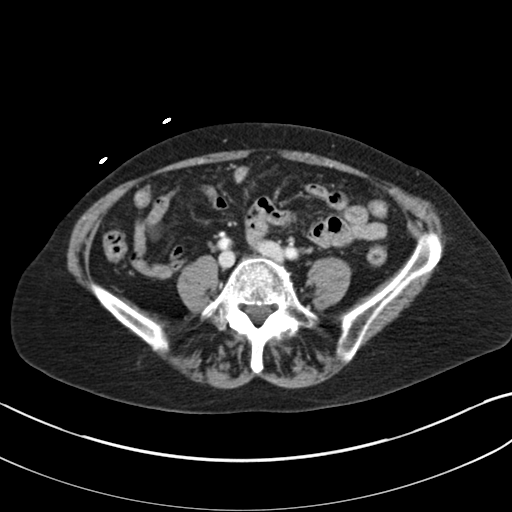
[im 44/79  soft-tissue]
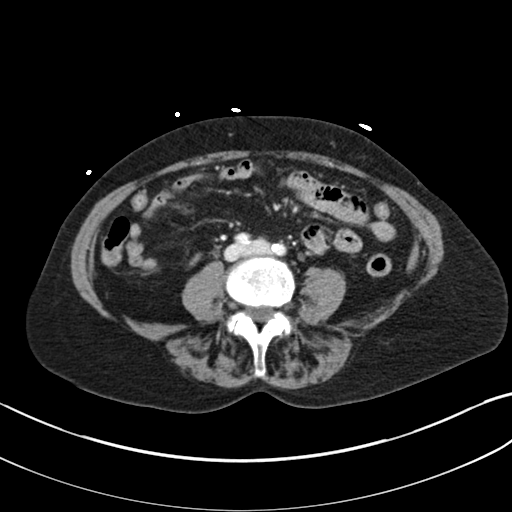
[im 53/79  soft-tissue]
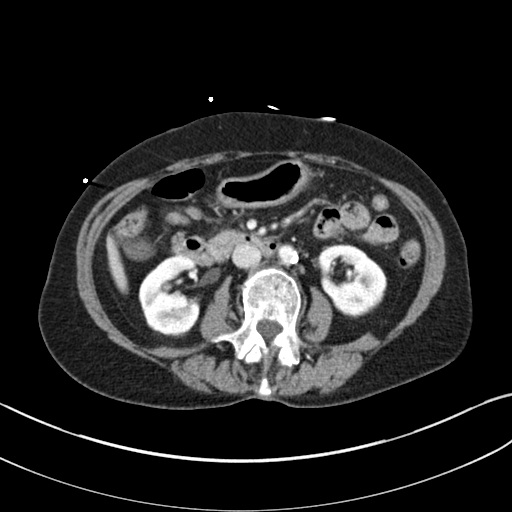
[im 53/79  bone]
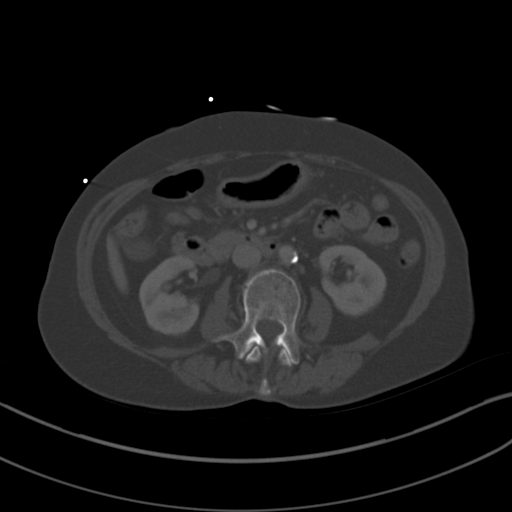
[im 57/79  soft-tissue]
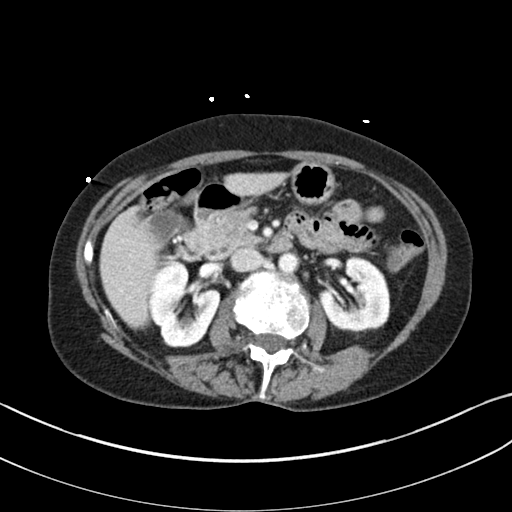
[im 61/79  soft-tissue]
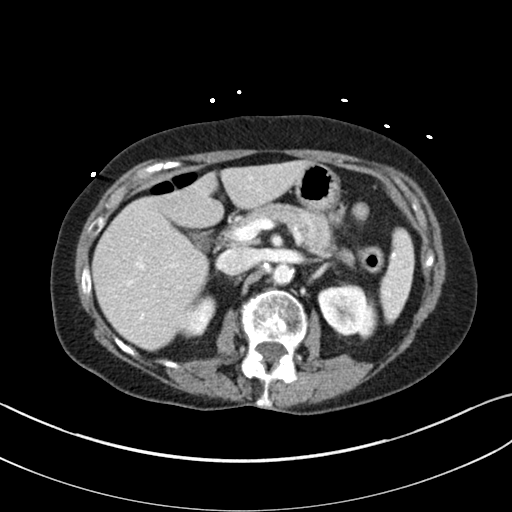
[im 70/79  soft-tissue]
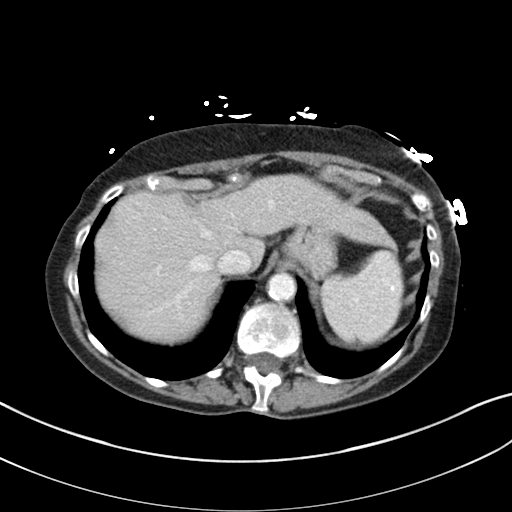
[im 74/79  soft-tissue]
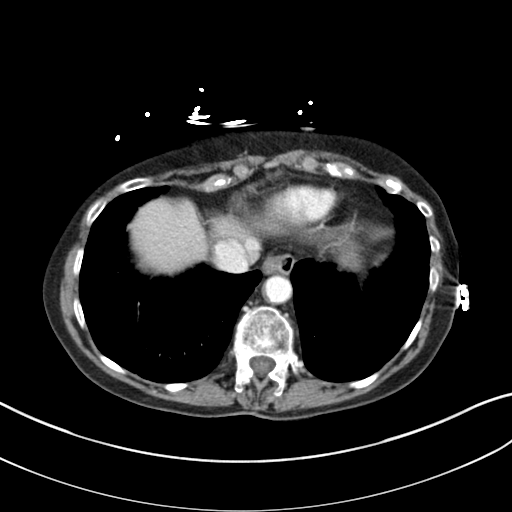

[Series 4: coronal st · coronal · 0.64mm/px · 3 of 113 slices shown]
[im 38/113  soft-tissue]
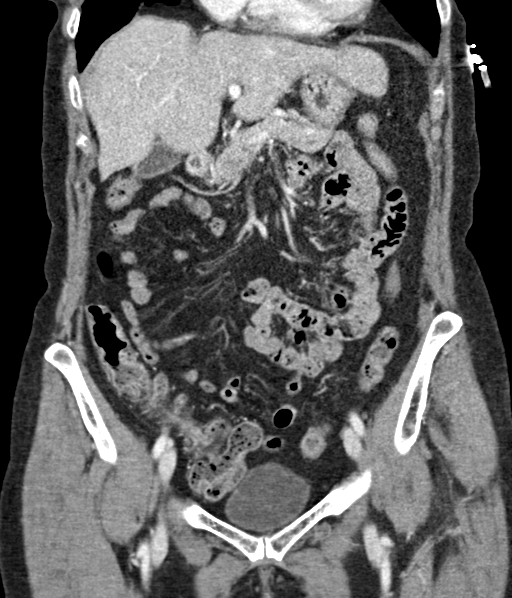
[im 50/113  soft-tissue]
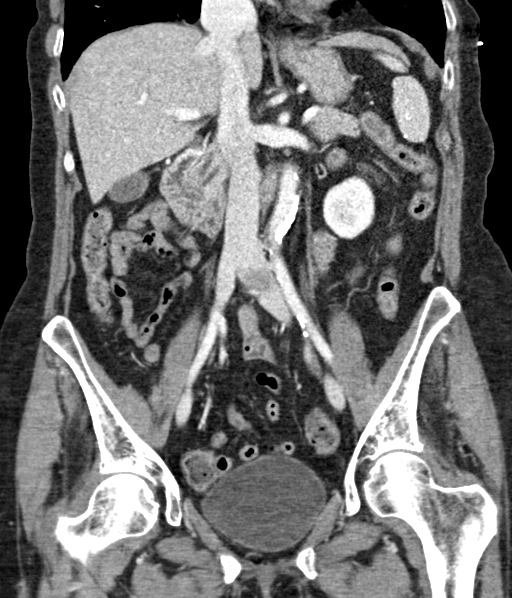
[im 63/113  soft-tissue]
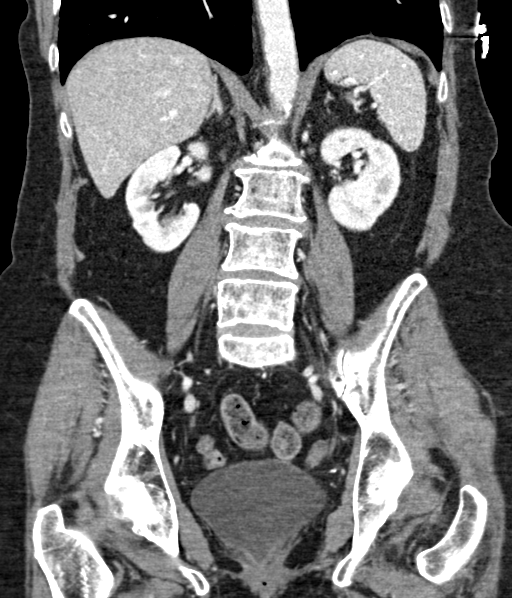

[16 of 46 positions shown; findings below may reference images not displayed]

FINDINGS: Lower chest: No acute abnormality.

Hepatobiliary: No focal liver abnormality is seen. No gallstones,
gallbladder wall thickening, or biliary dilatation.

Pancreas: Unremarkable. No pancreatic ductal dilatation or
surrounding inflammatory changes.

Spleen: Normal in size without focal abnormality.

Adrenals/Urinary Tract: Unremarkable adrenal glands. Kidneys enhance
symmetrically without focal lesion, stone, or hydronephrosis.
Ureters are nondilated. Urinary bladder appears unremarkable.

Stomach/Bowel: Stomach is within normal limits. Appendix not
definitively visualized. No evidence of bowel wall thickening,
distention, or inflammatory changes.

Vascular/Lymphatic: Scattered aortoiliac atherosclerosis.
Circumaortic left renal vein, an anatomic variant. No enlarged
abdominal or pelvic lymph nodes.

Reproductive: Retroverted slightly atrophic uterus. Mild prominence
of the endometrial stripe without focal lesion. Postsurgical changes
in the region of the right adnexa. No adnexal masses.

Other: No free fluid. No abdominopelvic fluid collection. No
pneumoperitoneum. No abdominal wall hernia.

Musculoskeletal: No acute or significant osseous findings.
IMPRESSION: 1. No acute abdominopelvic findings.
2. Prominent appearance of the endometrial stripe which could
reflect underlying endometrial hyperplasia. Consider pelvic
ultrasound for further evaluation.
3. Aortic atherosclerosis. ([id number]).

## 2022-05-07 ENCOUNTER — Ambulatory Visit (INDEPENDENT_AMBULATORY_CARE_PROVIDER_SITE_OTHER): Payer: PPO

## 2022-05-07 DIAGNOSIS — Z23 Encounter for immunization: Secondary | ICD-10-CM | POA: Diagnosis not present

## 2022-05-07 DIAGNOSIS — E538 Deficiency of other specified B group vitamins: Secondary | ICD-10-CM | POA: Diagnosis not present

## 2022-05-07 MED ORDER — CYANOCOBALAMIN 1000 MCG/ML IJ SOLN
1000.0000 ug | Freq: Once | INTRAMUSCULAR | Status: AC
  Administered 2022-05-07: 1000 ug via INTRAMUSCULAR

## 2022-05-07 NOTE — Progress Notes (Signed)
Pt here for monthly B12 injection per Dr Sarajane Jews  B12 1083mcg given IM, and pt tolerated injection well.  Next B12 injection scheduled for 06/04/2022

## 2022-05-15 ENCOUNTER — Telehealth: Payer: Self-pay | Admitting: Family Medicine

## 2022-05-15 NOTE — Telephone Encounter (Signed)
Pt thinks she has food poisoning from something she ate Tuesday. Was vomiting, diarrhea. Now feels weak. Wants to speak with provider

## 2022-05-15 NOTE — Telephone Encounter (Signed)
FYI Called and spoke with patient, she stated that on 05/13/22 she thinks she got food poisoning from a catered dinner.    On 05/14/22, pt stated that she was weak, vomiting a few times, also had diarrhea.       On today the patient stated that she is feeling better, she had two small bowls of Chicken and Rice soup, Pedialyte mixed with water, and drinking Coke.   Pt stated that she has no vomiting today and  she is currently in the bed, but she does feel better.    Pt stated that if no better she will call the office to schedule an appointment.

## 2022-05-16 NOTE — Telephone Encounter (Signed)
Noted  

## 2022-05-20 DIAGNOSIS — M19031 Primary osteoarthritis, right wrist: Secondary | ICD-10-CM | POA: Diagnosis not present

## 2022-05-20 DIAGNOSIS — M13831 Other specified arthritis, right wrist: Secondary | ICD-10-CM | POA: Diagnosis not present

## 2022-05-20 DIAGNOSIS — M1811 Unilateral primary osteoarthritis of first carpometacarpal joint, right hand: Secondary | ICD-10-CM | POA: Diagnosis not present

## 2022-05-28 DIAGNOSIS — Z4789 Encounter for other orthopedic aftercare: Secondary | ICD-10-CM | POA: Diagnosis not present

## 2022-06-04 ENCOUNTER — Ambulatory Visit (INDEPENDENT_AMBULATORY_CARE_PROVIDER_SITE_OTHER): Payer: PPO

## 2022-06-04 DIAGNOSIS — Z4789 Encounter for other orthopedic aftercare: Secondary | ICD-10-CM | POA: Diagnosis not present

## 2022-06-04 DIAGNOSIS — E538 Deficiency of other specified B group vitamins: Secondary | ICD-10-CM

## 2022-06-04 MED ORDER — CYANOCOBALAMIN 1000 MCG/ML IJ SOLN
1000.0000 ug | Freq: Once | INTRAMUSCULAR | Status: AC
  Administered 2022-06-04: 1000 ug via INTRAMUSCULAR

## 2022-06-04 NOTE — Progress Notes (Signed)
Per orders of Dr. Sarajane Jews, injection of Cyanocobalamin Inj 1000 mcg given by Encarnacion Slates on Left Deltoid.  Patient tolerated injection well.

## 2022-06-11 ENCOUNTER — Other Ambulatory Visit: Payer: Self-pay | Admitting: Family Medicine

## 2022-06-11 DIAGNOSIS — M25641 Stiffness of right hand, not elsewhere classified: Secondary | ICD-10-CM | POA: Diagnosis not present

## 2022-06-19 ENCOUNTER — Other Ambulatory Visit: Payer: Self-pay | Admitting: Family Medicine

## 2022-06-19 NOTE — Telephone Encounter (Signed)
Pt LOV was 01/20/22 Last refill was done on 12/20/21 Please advise

## 2022-06-20 DIAGNOSIS — M25641 Stiffness of right hand, not elsewhere classified: Secondary | ICD-10-CM | POA: Diagnosis not present

## 2022-06-27 DIAGNOSIS — M25641 Stiffness of right hand, not elsewhere classified: Secondary | ICD-10-CM | POA: Diagnosis not present

## 2022-07-01 ENCOUNTER — Ambulatory Visit: Payer: PPO

## 2022-07-01 ENCOUNTER — Encounter: Payer: Self-pay | Admitting: Family Medicine

## 2022-07-01 ENCOUNTER — Ambulatory Visit (INDEPENDENT_AMBULATORY_CARE_PROVIDER_SITE_OTHER): Payer: PPO | Admitting: Family Medicine

## 2022-07-01 VITALS — BP 110/76 | HR 81 | Temp 98.1°F | Wt 108.5 lb

## 2022-07-01 DIAGNOSIS — E538 Deficiency of other specified B group vitamins: Secondary | ICD-10-CM

## 2022-07-01 DIAGNOSIS — F411 Generalized anxiety disorder: Secondary | ICD-10-CM | POA: Diagnosis not present

## 2022-07-01 MED ORDER — CITALOPRAM HYDROBROMIDE 10 MG PO TABS: 10.0000 mg | ORAL_TABLET | Freq: Every day | ORAL | 3 refills | Status: DC

## 2022-07-01 MED ORDER — CYANOCOBALAMIN 1000 MCG/ML IJ SOLN
1000.0000 ug | Freq: Once | INTRAMUSCULAR | Status: AC
  Administered 2022-07-01: 1000 ug via INTRAMUSCULAR

## 2022-07-01 NOTE — Progress Notes (Signed)
   Subjective:    Patient ID: [name], female    DOB: 1946/02/12, 76 y.o.   MRN: [id number]  HPI Here with her husband to discuss her anxiety, which has been magnified by her recent hand surgery. On 05-20-22 she had an arthroplasty on the right thumb [facility] by Dr. Roseanne Kaufman. She wore a SPICA cast for a month, and now she wears a splint. She is going to PT and seems to be progressing steadily. She rates her pain levels at 3 out of 10 now. She is very frustrated about the fact that she cannot use her right hand for anything, since this is her dominant hand. With the holidays approaching, she is upset that she cannot clean her house, prepare food, wrap present, etc for her family. She worries that she will never totally regain use of the hand. This causes her to be depressed and very anxious. She takes Xanax twice a day, but this only gives her brief relief. She sleeps well using her Zolpidem. She has a very poor appetite and she has lost 10 lbs since the surgery.    Review of Systems  Constitutional: Negative.   Respiratory: Negative.    Cardiovascular: Negative.   Musculoskeletal:  Positive for arthralgias.  Psychiatric/Behavioral:  Positive for dysphoric mood. Negative for agitation, behavioral problems, confusion, decreased concentration, hallucinations and sleep disturbance. The patient is nervous/anxious.        Objective:   Physical Exam Constitutional:      Appearance: Normal appearance.  Cardiovascular:     Rate and Rhythm: Normal rate and regular rhythm.     Pulses: Normal pulses.     Heart sounds: Normal heart sounds.  Pulmonary:     Effort: Pulmonary effort is normal.     Breath sounds: Normal breath sounds.  Neurological:     Mental Status: She is alert and oriented to person, place, and time.  Psychiatric:        Behavior: Behavior normal.        Thought Content: Thought content normal.     Comments: Anxious and almost tearful            Assessment & Plan:   Her anxiety has become her main problem, and is likely slowing down her recovery from the surgery. She will start taking Celexa 10 mg daily, and she can still take the Xanax as needed. She will follow up with [place] in 3 weeks. We spoke about her anxiety and depression for 35 minutes today.  [name], MD

## 2022-07-01 NOTE — Addendum Note (Signed)
Addended by: Wyvonne Lenz on: 07/01/2022 11:40 AM   Modules accepted: Orders

## 2022-07-02 DIAGNOSIS — M79644 Pain in right finger(s): Secondary | ICD-10-CM | POA: Diagnosis not present

## 2022-07-02 DIAGNOSIS — M1811 Unilateral primary osteoarthritis of first carpometacarpal joint, right hand: Secondary | ICD-10-CM | POA: Diagnosis not present

## 2022-07-02 DIAGNOSIS — Z4789 Encounter for other orthopedic aftercare: Secondary | ICD-10-CM | POA: Diagnosis not present

## 2022-07-04 DIAGNOSIS — M25641 Stiffness of right hand, not elsewhere classified: Secondary | ICD-10-CM | POA: Diagnosis not present

## 2022-07-07 ENCOUNTER — Telehealth: Payer: Self-pay | Admitting: Family Medicine

## 2022-07-07 DIAGNOSIS — R1010 Upper abdominal pain, unspecified: Secondary | ICD-10-CM

## 2022-07-07 NOTE — Telephone Encounter (Signed)
She has some upper abdominal pain, early satiety, and weight loss so we will set up a CT of the abdomen and pelvis

## 2022-07-08 ENCOUNTER — Emergency Department (HOSPITAL_BASED_OUTPATIENT_CLINIC_OR_DEPARTMENT_OTHER)
Admission: EM | Admit: 2022-07-08 | Discharge: 2022-07-08 | Disposition: A | Discharge status: Home / Self Care | Payer: PPO | Attending: Emergency Medicine | Admitting: Emergency Medicine

## 2022-07-08 ENCOUNTER — Other Ambulatory Visit: Payer: Self-pay

## 2022-07-08 ENCOUNTER — Emergency Department (HOSPITAL_BASED_OUTPATIENT_CLINIC_OR_DEPARTMENT_OTHER): Payer: PPO

## 2022-07-08 ENCOUNTER — Other Ambulatory Visit (HOSPITAL_BASED_OUTPATIENT_CLINIC_OR_DEPARTMENT_OTHER): Payer: Self-pay

## 2022-07-08 ENCOUNTER — Telehealth: Payer: Self-pay | Admitting: Family Medicine

## 2022-07-08 ENCOUNTER — Encounter (HOSPITAL_BASED_OUTPATIENT_CLINIC_OR_DEPARTMENT_OTHER): Payer: Self-pay | Admitting: Emergency Medicine

## 2022-07-08 DIAGNOSIS — R634 Abnormal weight loss: Secondary | ICD-10-CM | POA: Diagnosis not present

## 2022-07-08 DIAGNOSIS — Z1152 Encounter for screening for COVID-19: Secondary | ICD-10-CM | POA: Insufficient documentation

## 2022-07-08 DIAGNOSIS — R531 Weakness: Secondary | ICD-10-CM | POA: Insufficient documentation

## 2022-07-08 DIAGNOSIS — R110 Nausea: Secondary | ICD-10-CM | POA: Diagnosis not present

## 2022-07-08 LAB — COMPREHENSIVE METABOLIC PANEL
ALT: 16 U/L (ref 0–44)
AST: 19 U/L (ref 15–41)
Albumin: 4.8 g/dL (ref 3.5–5.0)
Alkaline Phosphatase: 58 U/L (ref 38–126)
Anion gap: 12 (ref 5–15)
BUN: 9 mg/dL (ref 8–23)
CO2: 24 mmol/L (ref 22–32)
Calcium: 10 mg/dL (ref 8.9–10.3)
Chloride: 100 mmol/L (ref 98–111)
Creatinine, Ser: 0.74 mg/dL (ref 0.44–1.00)
GFR, Estimated: >60 mL/min (ref >60)
Glucose, Bld: 102 mg/dL — ABNORMAL HIGH (ref 70–99)
Potassium: 4.1 mmol/L (ref 3.5–5.1)
Sodium: 136 mmol/L (ref 135–145)
Total Bilirubin: 0.8 mg/dL (ref 0.3–1.2)
Total Protein: 7.6 g/dL (ref 6.5–8.1)

## 2022-07-08 LAB — URINALYSIS, ROUTINE W REFLEX MICROSCOPIC
Bilirubin Urine: NEGATIVE
Glucose, UA: NEGATIVE mg/dL
Hgb urine dipstick: NEGATIVE
Leukocytes,Ua: NEGATIVE
Nitrite: NEGATIVE
Protein, ur: NEGATIVE mg/dL
Specific Gravity, Urine: 1.013 (ref 1.005–1.030)
pH: 7.5 (ref 5.0–8.0)

## 2022-07-08 LAB — RESP PANEL BY RT-PCR (RSV, FLU A&B, COVID)  RVPGX2
Influenza A by PCR: NEGATIVE
Influenza B by PCR: NEGATIVE
Resp Syncytial Virus by PCR: NEGATIVE
SARS Coronavirus 2 by RT PCR: NEGATIVE

## 2022-07-08 LAB — CBC
HCT: 37 % (ref 36.0–46.0)
Hemoglobin: 12.7 g/dL (ref 12.0–15.0)
MCH: 33.1 pg (ref 26.0–34.0)
MCHC: 34.3 g/dL (ref 30.0–36.0)
MCV: 96.4 fL (ref 80.0–100.0)
Platelets: 225 10*3/uL (ref 150–400)
RBC: 3.84 MIL/uL — ABNORMAL LOW (ref 3.87–5.11)
RDW: 12 % (ref 11.5–15.5)
WBC: 6.7 10*3/uL (ref 4.0–10.5)
nRBC: 0 % (ref 0.0–0.2)

## 2022-07-08 LAB — TROPONIN I (HIGH SENSITIVITY): Troponin I (High Sensitivity): 4 ng/L (ref <18)

## 2022-07-08 MED ORDER — ONDANSETRON HCL 4 MG/2ML IJ SOLN
4.0000 mg | Freq: Once | INTRAMUSCULAR | Status: AC
  Administered 2022-07-08: 4 mg via INTRAVENOUS
  Filled 2022-07-08: qty 2

## 2022-07-08 MED ORDER — LORAZEPAM 2 MG/ML IJ SOLN
0.5000 mg | Freq: Once | INTRAMUSCULAR | Status: AC
  Administered 2022-07-08: 0.5 mg via INTRAVENOUS
  Filled 2022-07-08: qty 1

## 2022-07-08 MED ORDER — PROMETHAZINE HCL 25 MG PO TABS
25.0000 mg | ORAL_TABLET | Freq: Four times a day (QID) | ORAL | 0 refills | Status: DC | PRN
  Filled 2022-07-08: qty 30, 8d supply, fill #0

## 2022-07-08 MED ORDER — LACTATED RINGERS IV BOLUS
500.0000 mL | Freq: Once | INTRAVENOUS | Status: AC
  Administered 2022-07-08: 500 mL via INTRAVENOUS

## 2022-07-08 NOTE — ED Notes (Signed)
Patient verbalizes understanding of discharge instructions. Opportunity for questioning and answers were provided. Patient discharged from ED.  °

## 2022-07-08 NOTE — Discharge Instructions (Signed)
Please drink plenty of fluids Return to the emergency department if you are having worsening or new symptoms at any time

## 2022-07-08 NOTE — ED Provider Notes (Signed)
[facility] Provider Note   CSN: [id number] Arrival date & time: 07/08/22  0865     History  Chief Complaint  Patient presents with   Weakness    [name] is a 76 y.o. female.  HPI 76 yo female ho gerd, nausea, recent hand surgery, anxiety presents today with anxiety, nausea, no vomiting.  She had some constipation and took dulcolax with large bowel movement. She denies chest pain, no sob, no abdominal pain, no fever or chills but feels some substernal fullness and nausea.  Fullness is constant.      Home Medications Prior to Admission medications   Medication Sig Start Date End Date Taking? Authorizing Provider  promethazine (PHENERGAN) 25 MG tablet Take 1 tablet (25 mg total) by mouth every 6 (six) hours as needed for nausea or vomiting. 07/08/22  Yes Kacey Dysert, Andee Poles, MD  ALPRAZolam Duanne Moron) 0.5 MG tablet TAKE 1 TABLET(0.5 MG) BY MOUTH TWICE DAILY AS NEEDED FOR ANXIETY OR SLEEP 04/04/22   Laurey Morale, MD  cetirizine (ZYRTEC) 10 MG tablet Take 10 mg by mouth daily as needed.     [provider]  citalopram (CELEXA) 10 MG tablet Take 1 tablet (10 mg total) by mouth daily. 07/01/22   Laurey Morale, MD  famotidine (PEPCID) 20 MG tablet Take 20 mg by mouth daily.    [provider]  metoprolol tartrate (LOPRESSOR) 25 MG tablet TAKE 1 TABLET(25 MG) BY MOUTH TWICE DAILY 06/11/22   Laurey Morale, MD  ondansetron (ZOFRAN) 4 MG tablet Take 8 mg by mouth every 8 (eight) hours. 07/05/22   [provider]  rosuvastatin (CRESTOR) 20 MG tablet TAKE 1 TABLET(20 MG) BY MOUTH DAILY 04/17/22   Hilty, Nadean Corwin, MD  zolpidem (AMBIEN) 5 MG tablet TAKE 1 TABLET(5 MG) BY MOUTH AT BEDTIME AS NEEDED FOR SLEEP 06/20/22   Laurey Morale, MD      Allergies    Codeine phosphate, Ephedrine hcl, Lipitor [atorvastatin], and Penicillins    Review of Systems   Review of Systems  Physical Exam Updated Vital Signs BP 123/87   Pulse 73    Temp 98 F (36.7 C) (Oral)   Resp 20   Ht 1.676 m (5\' 6" )   Wt 50.3 kg   SpO2 97%   BMI 17.92 kg/m  Physical Exam Vitals and nursing note reviewed.  HENT:     Head: Normocephalic.     Right Ear: External ear normal.     Left Ear: External ear normal.     Nose: Nose normal.     Mouth/Throat:     Pharynx: Oropharynx is clear.  Eyes:     Extraocular Movements: Extraocular movements intact.     Pupils: Pupils are equal, round, and reactive to light.  Cardiovascular:     Rate and Rhythm: Normal rate and regular rhythm.     Pulses: Normal pulses.  Pulmonary:     Effort: Pulmonary effort is normal.  Abdominal:     General: Abdomen is flat. Bowel sounds are normal.     Palpations: Abdomen is soft.     Tenderness: There is no abdominal tenderness.  Musculoskeletal:        General: Normal range of motion.     Cervical back: Normal range of motion.  Skin:    General: Skin is warm and dry.     Capillary Refill: Capillary refill takes less than 2 seconds.  Neurological:     General: No focal deficit  present.     Mental Status: She is alert.  Psychiatric:        Mood and Affect: Mood normal.     ED Results / Procedures / Treatments   Labs (all labs ordered are listed, but only abnormal results are displayed) Labs Reviewed  CBC - Abnormal; Notable for the following components:      Result Value   RBC 3.84 (*)    All other components within normal limits  COMPREHENSIVE METABOLIC PANEL - Abnormal; Notable for the following components:   Glucose, Bld 102 (*)    All other components within normal limits  URINALYSIS, ROUTINE W REFLEX MICROSCOPIC - Abnormal; Notable for the following components:   Ketones, ur TRACE (*)    All other components within normal limits  RESP PANEL BY RT-PCR (RSV, FLU A&B, COVID)  [id number]  TROPONIN I (HIGH SENSITIVITY)  TROPONIN I (HIGH SENSITIVITY)    EKG EKG Interpretation  Date/Time:  Tuesday July 08 2022 12:36:13 EST Ventricular Rate:   59 PR Interval:  130 QRS Duration: 85 QT Interval:  406 QTC Calculation: 403 R Axis:   63 Text Interpretation: Sinus rhythm Atrial premature complex RSR' in V1 or V2, probably normal variant No significant change since last tracing Confirmed by Pattricia Boss [phone]) on 07/08/2022 12:40:31 PM  Radiology DG Chest Port 1 View  Result Date: 07/08/2022 CLINICAL DATA:  Generalized weakness, weight loss. EXAM: PORTABLE CHEST 1 VIEW COMPARISON:  June 30, 2011. FINDINGS: The heart size and mediastinal contours are within normal limits. Mild biapical scarring is noted. Stable probable bilateral upper lobe scarring. No acute pulmonary disease is noted. Moderate dextroscoliosis of thoracic spine is noted. IMPRESSION: No active disease. Electronically Signed   By: Marijo Conception M.D.   On: 07/08/2022 12:45    Procedures Procedures    Medications Ordered in ED Medications  lactated ringers bolus 500 mL (0 mLs Intravenous Stopped 07/08/22 1334)  LORazepam (ATIVAN) injection 0.5 mg (0.5 mg Intravenous Given 07/08/22 1254)  ondansetron (ZOFRAN) injection 4 mg (4 mg Intravenous Given 07/08/22 1254)    ED Course/ Medical Decision Making/ A&P Clinical Course as of 07/08/22 1510  Tue Jul 08, 2022  9798 Complete metabolic panel reviewed and interpreted and within normal limits [DR]  1509 Urinalysis is clear [DR]  1509 Troponin is normal [DR]  1510 CBC reviewed and interpreted and is within normal limits [DR]  1510 Respiratory panel is reviewed and interpreted and is negative for COVID, flu A, flu B, and RSV [DR]    Clinical Course User Index [DR] Pattricia Boss, MD                           Medical Decision Making 76 year old female presents today with nausea, anxiety, generalized weakness. Differential diagnosis is broad and includes but is not limited to electrolyte abnormalities, volume depletion, infection, anxiety Patient is evaluated here in the ED with CBC, complete metabolic panel,  EKG, chest x-Herminia Warren, urinalysis. These are all normal.  She received some IV fluids and 1/2 mg of Ativan and feels improved.  She states that the Zofran that she was previously prescribed does not work very well and asked for a different antiemetic.  She is instructed to not take the Zofran and Phenergan at the same time.  We discussed the possible sedating effects of Phenergan.  She is given a prescription for Phenergan.  Amount and/or Complexity of Data Reviewed Labs: ordered. Decision-making details documented in  ED Course. Radiology: ordered and independent interpretation performed. Decision-making details documented in ED Course.  Risk Prescription drug management.          Final Clinical Impression(s) / ED Diagnoses Final diagnoses:  Nausea  Weakness    Rx / DC Orders ED Discharge Orders          Ordered    promethazine (PHENERGAN) 25 MG tablet  Every 6 hours PRN        07/08/22 1508              Pattricia Boss, MD 07/08/22 1510

## 2022-07-08 NOTE — Telephone Encounter (Signed)
Appt scheduled for 07/10/22.   Patient's husband Serena Croissant sent my chart messages on 07/07/22 concerning patient.

## 2022-07-08 NOTE — ED Triage Notes (Signed)
Patient presents POV C/O generalized weakness, weight loss, dec appetite and nausea X several weeks. Hand surgery X7 weeks ago and states S/S have occurred since the surgery. Spoke with orthopedic surgeon who Rx Zofran and Celexa for anxiety.

## 2022-07-08 NOTE — Telephone Encounter (Signed)
I see she is in the ED right now

## 2022-07-08 NOTE — Telephone Encounter (Signed)
Wants to see if provider can "work her in" today

## 2022-07-08 NOTE — Telephone Encounter (Signed)
Noted  

## 2022-07-10 ENCOUNTER — Ambulatory Visit (INDEPENDENT_AMBULATORY_CARE_PROVIDER_SITE_OTHER): Payer: PPO | Admitting: Family Medicine

## 2022-07-10 ENCOUNTER — Encounter: Payer: Self-pay | Admitting: Family Medicine

## 2022-07-10 ENCOUNTER — Other Ambulatory Visit: Payer: Self-pay

## 2022-07-10 ENCOUNTER — Telehealth: Payer: Self-pay | Admitting: Family Medicine

## 2022-07-10 VITALS — BP 124/78 | HR 63 | Temp 97.7°F | Wt 108.4 lb

## 2022-07-10 DIAGNOSIS — I10 Essential (primary) hypertension: Secondary | ICD-10-CM | POA: Diagnosis not present

## 2022-07-10 DIAGNOSIS — F411 Generalized anxiety disorder: Secondary | ICD-10-CM | POA: Diagnosis not present

## 2022-07-10 DIAGNOSIS — R112 Nausea with vomiting, unspecified: Secondary | ICD-10-CM

## 2022-07-10 MED ORDER — PROMETHAZINE HCL 25 MG PO TABS: 25.0000 mg | ORAL_TABLET | Freq: Four times a day (QID) | ORAL | 1 refills | Status: DC | PRN

## 2022-07-10 NOTE — Progress Notes (Signed)
   Subjective:    Patient ID: NAARAH BORGERDING, female    DOB: [birth year], 76 y.o.   MRN: [id number]  HPI Here with her husband to follow up on an ED visit on 07-08-22 for nausea and vomiting, and generalized weakness. Her exam was normal. Her labs were normal including CBC and electrolytes. Her CXR was clear. She was given IV fluids, and this made her feels better. She has using Zofran for nausea, but this is not working. She denies any abdominal pain, but she has a very poor appetite. We have been treating  Her anxiety with Celexa 10 mg daily and Xanax 0.5 mg as needed, but these are not helping much.   Review of Systems  Constitutional: Negative.   Respiratory: Negative.    Cardiovascular: Negative.   Gastrointestinal:  Positive for nausea and vomiting. Negative for abdominal distention, abdominal pain, blood in stool, constipation and diarrhea.  Genitourinary: Negative.   Psychiatric/Behavioral:  Negative for dysphoric mood. The patient is nervous/anxious.        Objective:   Physical Exam Constitutional:      Appearance: Normal appearance.  Cardiovascular:     Rate and Rhythm: Normal rate and regular rhythm.     Pulses: Normal pulses.     Heart sounds: Normal heart sounds.  Pulmonary:     Effort: Pulmonary effort is normal.     Breath sounds: Normal breath sounds.  Neurological:     Mental Status: She is alert and oriented to person, place, and time. Mental status is at baseline.  Psychiatric:        Mood and Affect: Mood normal.        Behavior: Behavior normal.        Thought Content: Thought content normal.           Assessment & Plan:  She has had some recent nausea and vomiting that are likely related to her anxiety. She will stop Zofran and try Phenergan as needed instead. We will increase the Celexa to 2 pills (20 mg) daily. We will increase the Xanax to 2 pills (1 mg) as needed. We will start her on Pepcid 20 mg BID at least for awhile. I suggested she take a  Phenergan about 30 minutes prior to eating any food, and she can try Ensure shakes.  We spent a total of ( 34  ) minutes reviewing records and discussing these issues.  [name], MD

## 2022-07-10 NOTE — Telephone Encounter (Signed)
Rx refill sent.

## 2022-07-10 NOTE — Telephone Encounter (Signed)
Patient needs a refill on Promethazine 25 mg.  Patient is afraid she is going to run out through Christmas.  She has to take this medicine every 6 hours.   Pharmacy- [facility] on [address]

## 2022-07-11 DIAGNOSIS — M25641 Stiffness of right hand, not elsewhere classified: Secondary | ICD-10-CM | POA: Diagnosis not present

## 2022-07-18 ENCOUNTER — Other Ambulatory Visit: Payer: Self-pay

## 2022-07-18 DIAGNOSIS — M25641 Stiffness of right hand, not elsewhere classified: Secondary | ICD-10-CM | POA: Diagnosis not present

## 2022-07-18 MED ORDER — CITALOPRAM HYDROBROMIDE 20 MG PO TABS: 20.0000 mg | ORAL_TABLET | Freq: Every day | ORAL | 5 refills | Status: DC

## 2022-07-18 NOTE — Telephone Encounter (Signed)
Pt's spouse called to say they need a refill of the:  citalopram (CELEXA) 10 MG tablet   Spouse states Rx should be for 2 tablets a day - They will be running out tomorrow (Saturday)  LOV:  07/10/22  Please advise.  [id number] [address], [place] Phone: [phone]  Fax: [phone]

## 2022-07-18 NOTE — Telephone Encounter (Signed)
Called pt husband Al to inform that prescription for Celexa 20 mg was sent to [facility]

## 2022-07-18 NOTE — Telephone Encounter (Signed)
Change this to Celexa 20 mg one tab a day. Call in #[phone] with 5 rf

## 2022-07-18 NOTE — Telephone Encounter (Signed)
Prescription currently on file for Celexa 10 mg 1 tab daily.    Per Dr. Sarajane Jews at 07/10/22 OV-  We will increase the Celexa to 2 pills (20 mg) daily,   Please advise

## 2022-07-22 ENCOUNTER — Ambulatory Visit (INDEPENDENT_AMBULATORY_CARE_PROVIDER_SITE_OTHER): Payer: PPO | Admitting: Family Medicine

## 2022-07-22 ENCOUNTER — Encounter: Payer: Self-pay | Admitting: Family Medicine

## 2022-07-22 VITALS — BP 110/74 | HR 66 | Temp 97.6°F | Wt 107.6 lb

## 2022-07-22 DIAGNOSIS — R110 Nausea: Secondary | ICD-10-CM

## 2022-07-22 DIAGNOSIS — G4700 Insomnia, unspecified: Secondary | ICD-10-CM

## 2022-07-22 DIAGNOSIS — F411 Generalized anxiety disorder: Secondary | ICD-10-CM | POA: Diagnosis not present

## 2022-07-22 DIAGNOSIS — R630 Anorexia: Secondary | ICD-10-CM | POA: Diagnosis not present

## 2022-07-22 MED ORDER — FLUOXETINE HCL 40 MG PO CAPS: 40.0000 mg | ORAL_CAPSULE | Freq: Every day | ORAL | 3 refills | Status: DC

## 2022-07-22 NOTE — Progress Notes (Signed)
   Subjective:    Patient ID: [name], female    DOB: 1945/09/16, 77 y.o.   MRN: [id number]  HPI Here to follow up on anxiety, nausea, and poor appetite. She has been taking Celexa 20 mg daily as well as prn Xanax, and she feels a little better. She still gets very anxious however and she thinks the nausea is an extension of this. Her weight today is unchanged from last time. She sleeps well. She is getting PT for her right hand after the surgery, and she has much less pain than before.    Review of Systems  Constitutional: Negative.   Respiratory: Negative.    Cardiovascular: Negative.   Gastrointestinal:  Positive for nausea. Negative for abdominal pain and vomiting.  Musculoskeletal:  Positive for arthralgias.  Psychiatric/Behavioral:  Negative for agitation, confusion, dysphoric mood, hallucinations and sleep disturbance. The patient is nervous/anxious.        Objective:   Physical Exam Constitutional:      Appearance: Normal appearance.  Cardiovascular:     Rate and Rhythm: Normal rate and regular rhythm.     Pulses: Normal pulses.     Heart sounds: Normal heart sounds.  Pulmonary:     Effort: Pulmonary effort is normal.     Breath sounds: Normal breath sounds.  Neurological:     General: No focal deficit present.     Mental Status: She is alert and oriented to person, place, and time.  Psychiatric:        Mood and Affect: Mood normal.        Behavior: Behavior normal.        Thought Content: Thought content normal.           Assessment & Plan:  Her anxiety has improved slightly, but we agreed to stop Celexa and to begin Prozac 40 mg daily. Use Xanax and Phenergan as needed. Recheck in 3 weeks.  [name], MD

## 2022-07-25 DIAGNOSIS — M25641 Stiffness of right hand, not elsewhere classified: Secondary | ICD-10-CM | POA: Diagnosis not present

## 2022-07-29 ENCOUNTER — Ambulatory Visit (INDEPENDENT_AMBULATORY_CARE_PROVIDER_SITE_OTHER): Payer: PPO

## 2022-07-29 DIAGNOSIS — E538 Deficiency of other specified B group vitamins: Secondary | ICD-10-CM | POA: Diagnosis not present

## 2022-07-29 MED ORDER — CYANOCOBALAMIN 1000 MCG/ML IJ SOLN
1000.0000 ug | INTRAMUSCULAR | Status: DC
  Administered 2022-07-29 – 2022-11-21 (×4): 1000 ug via INTRAMUSCULAR

## 2022-07-29 NOTE — Progress Notes (Signed)
Pt here for monthly B12 injection per Dr Sarajane Jews  B12 1052mcg given IM left deltoid and pt tolerated injection well.  Next B12 injection scheduled for 08/29/22

## 2022-08-01 DIAGNOSIS — M25641 Stiffness of right hand, not elsewhere classified: Secondary | ICD-10-CM | POA: Diagnosis not present

## 2022-08-08 DIAGNOSIS — M25641 Stiffness of right hand, not elsewhere classified: Secondary | ICD-10-CM | POA: Diagnosis not present

## 2022-08-12 ENCOUNTER — Encounter: Payer: Self-pay | Admitting: Family Medicine

## 2022-08-12 ENCOUNTER — Ambulatory Visit (INDEPENDENT_AMBULATORY_CARE_PROVIDER_SITE_OTHER): Payer: PPO | Admitting: Family Medicine

## 2022-08-12 VITALS — BP 110/72 | HR 67 | Temp 97.6°F | Wt 106.9 lb

## 2022-08-12 DIAGNOSIS — F411 Generalized anxiety disorder: Secondary | ICD-10-CM | POA: Diagnosis not present

## 2022-08-12 NOTE — Progress Notes (Signed)
   Subjective:    Patient ID: [name], female    DOB: 1946/03/06, 77 y.o.   MRN: [id number]  HPI Here to follow up on anxiety. She started taking 40 mg a Prozac daily 3 weeks ago, and she thinks this has helped a little. She feels a bit less anxious and a little more confident than before. She drove herself to the office today. She still struggles with anxiety however, and she hints today about a number of family issues that she worries about.    Review of Systems  Constitutional: Negative.   Respiratory: Negative.    Cardiovascular: Negative.   Psychiatric/Behavioral:  Negative for agitation, confusion, decreased concentration, dysphoric mood and sleep disturbance. The patient is nervous/anxious.        Objective:   Physical Exam Constitutional:      Appearance: Normal appearance.  Cardiovascular:     Rate and Rhythm: Normal rate and regular rhythm.     Pulses: Normal pulses.     Heart sounds: Normal heart sounds.  Pulmonary:     Effort: Pulmonary effort is normal.     Breath sounds: Normal breath sounds.  Neurological:     Mental Status: She is alert and oriented to person, place, and time.  Psychiatric:     Comments: She is a little anxious today, but she has improved from our last visit            Assessment & Plan:  Her anxiety is somewhat improved on Prozac. We agreed to keep on her current medications. I urged her to talk to a therapist as well, and she seemed interested. We will follow up in one month. Gershon Crane, MD

## 2022-08-15 DIAGNOSIS — M25641 Stiffness of right hand, not elsewhere classified: Secondary | ICD-10-CM | POA: Diagnosis not present

## 2022-08-22 DIAGNOSIS — M25641 Stiffness of right hand, not elsewhere classified: Secondary | ICD-10-CM | POA: Diagnosis not present

## 2022-08-26 ENCOUNTER — Telehealth: Payer: Self-pay | Admitting: Internal Medicine

## 2022-08-26 NOTE — Telephone Encounter (Signed)
Patients spouse called to request an appointment to follow up with Dr. Henrene Pastor, states his wife is having a lot of GI issues. I offered next available which is in April with Dr. Henrene Pastor and also ask if an APP would be okay. He declined appointment with an APP. He said Dr. Henrene Pastor is a neighbor and he is sure the patient can be accommodated. Please advise.

## 2022-08-26 NOTE — Telephone Encounter (Signed)
Husband states pt has stomach discomfort every morning, she has not been eating much and her bowels have been irregular. Pts husband requesting her be seen by Dr. Henrene Pastor sooner than first available. They do not want to see an APP. Pt scheduled to see Dr. Henrene Pastor 2/26 at 3:40pm. Husband aware of appt.

## 2022-08-28 ENCOUNTER — Ambulatory Visit (INDEPENDENT_AMBULATORY_CARE_PROVIDER_SITE_OTHER): Payer: PPO

## 2022-08-28 DIAGNOSIS — E538 Deficiency of other specified B group vitamins: Secondary | ICD-10-CM | POA: Diagnosis not present

## 2022-08-28 NOTE — Progress Notes (Signed)
Pt here for monthly B12 injection per Dr Sarajane Jews  B12 1013mcg given IM, and pt tolerated injection well.  Next B12 injection scheduled for 09/26/2022

## 2022-08-29 ENCOUNTER — Ambulatory Visit: Payer: PPO

## 2022-08-29 ENCOUNTER — Telehealth: Payer: Self-pay

## 2022-08-29 DIAGNOSIS — M25641 Stiffness of right hand, not elsewhere classified: Secondary | ICD-10-CM | POA: Diagnosis not present

## 2022-08-29 NOTE — Telephone Encounter (Signed)
Recall cologuard screening, would you like to proceed due to age?

## 2022-08-29 NOTE — Telephone Encounter (Signed)
She actually has an appointment to see me in 2 weeks. I will discuss with her. Ok to hold off. Thanks

## 2022-09-01 ENCOUNTER — Ambulatory Visit (INDEPENDENT_AMBULATORY_CARE_PROVIDER_SITE_OTHER): Payer: PPO | Admitting: Family Medicine

## 2022-09-01 ENCOUNTER — Encounter: Payer: Self-pay | Admitting: Family Medicine

## 2022-09-01 VITALS — BP 140/78 | HR 70 | Temp 98.2°F | Wt 105.4 lb

## 2022-09-01 DIAGNOSIS — F411 Generalized anxiety disorder: Secondary | ICD-10-CM

## 2022-09-01 MED ORDER — FLUOXETINE HCL 10 MG PO CAPS: ORAL_CAPSULE | ORAL | 3 refills | Status: DC

## 2022-09-01 NOTE — Progress Notes (Signed)
   Subjective:    Patient ID: [name], female    DOB: [birth year], 78 y.o.   MRN: [id number]  HPI Here to follow up on anxiety. She has been taking Prozac 40 mg daily and Xanax once or twice a day. She is still anxious, but she says she feels a little better overall. She has not contacted a therapist yet. She sleeps well. Her appetite is still quite poor. She wants to stop the Prozac because she thinks it simply makes her tired but it is not helping the anxiety at all. The Xanax works quite well. She has an appt to see Dr. Henrene Pastor, her GI, on 09-15-22.    Review of Systems  Constitutional: Negative.   Respiratory: Negative.    Cardiovascular: Negative.   Psychiatric/Behavioral:  Negative for agitation, behavioral problems, confusion, decreased concentration, dysphoric mood and sleep disturbance. The patient is nervous/anxious.        Objective:   Physical Exam Constitutional:      Appearance: Normal appearance.  Cardiovascular:     Rate and Rhythm: Normal rate and regular rhythm.     Pulses: Normal pulses.     Heart sounds: Normal heart sounds.  Pulmonary:     Effort: Pulmonary effort is normal.     Breath sounds: Normal breath sounds.  Neurological:     Mental Status: She is alert and oriented to person, place, and time. Mental status is at baseline.  Psychiatric:        Behavior: Behavior normal.        Thought Content: Thought content normal.     Comments: She is a bit anxious today            Assessment & Plan:  Her anxiety is slightly improved, but she is still dealing with this daily. We agreed to taper off the Prozac rather than stopping it abruptly. She will take 20 mg a day for one week, then take 10 mg a day for one week, then stop. She will continue to use Xanax as needed. Recheck in 3 weeks. [name], MD

## 2022-09-06 ENCOUNTER — Other Ambulatory Visit: Payer: Self-pay | Admitting: Family Medicine

## 2022-09-10 DIAGNOSIS — M79644 Pain in right finger(s): Secondary | ICD-10-CM | POA: Diagnosis not present

## 2022-09-10 DIAGNOSIS — R52 Pain, unspecified: Secondary | ICD-10-CM | POA: Diagnosis not present

## 2022-09-10 DIAGNOSIS — M1811 Unilateral primary osteoarthritis of first carpometacarpal joint, right hand: Secondary | ICD-10-CM | POA: Diagnosis not present

## 2022-09-12 ENCOUNTER — Ambulatory Visit: Payer: PPO | Admitting: Family Medicine

## 2022-09-15 ENCOUNTER — Encounter: Payer: Self-pay | Admitting: Internal Medicine

## 2022-09-15 ENCOUNTER — Ambulatory Visit: Payer: PPO | Admitting: Internal Medicine

## 2022-09-15 VITALS — BP 130/74 | HR 76 | Ht 66.0 in | Wt 104.4 lb

## 2022-09-15 DIAGNOSIS — R630 Anorexia: Secondary | ICD-10-CM | POA: Diagnosis not present

## 2022-09-15 DIAGNOSIS — R634 Abnormal weight loss: Secondary | ICD-10-CM | POA: Diagnosis not present

## 2022-09-15 DIAGNOSIS — F419 Anxiety disorder, unspecified: Secondary | ICD-10-CM | POA: Diagnosis not present

## 2022-09-15 DIAGNOSIS — R109 Unspecified abdominal pain: Secondary | ICD-10-CM

## 2022-09-15 DIAGNOSIS — F32A Depression, unspecified: Secondary | ICD-10-CM | POA: Diagnosis not present

## 2022-09-15 DIAGNOSIS — R1013 Epigastric pain: Secondary | ICD-10-CM | POA: Diagnosis not present

## 2022-09-15 DIAGNOSIS — R194 Change in bowel habit: Secondary | ICD-10-CM | POA: Diagnosis not present

## 2022-09-15 MED ORDER — SERTRALINE HCL 25 MG PO TABS: 25.0000 mg | ORAL_TABLET | Freq: Every day | ORAL | 2 refills | Status: DC

## 2022-09-15 NOTE — Progress Notes (Signed)
HISTORY OF PRESENT ILLNESS:  [name] is a 77 y.o. female with past medical history as listed below.  She presents today with a myriad of complaints including depressive symptoms, decreased appetite, weight loss, abdominal distress, and irregular bowel habits.  I last saw the patient in the office May 25, 2020 when she was being evaluated and treated for functional dyspepsia with associated anxiety/depression.  See that dictation for details.  At that time we had her on PPI and a gradual increase in Zoloft therapy.  As part of the workup she underwent upper endoscopy March 22, 2020.  This revealed incidental esophageal ring and hiatal hernia.  CT scan that same month revealed no acute abnormalities.  At the time of her last visit she was to increase her Zoloft to 75 mg and see me in 2 months.  She never returned.  She tells me that she stayed on Zoloft for a while.  She states she was feeling well and eventually weaned herself off of the medication.  She tells me that she seemed to be doing well until May 20, 2022 when she underwent hand surgery.  Sounds like excellent recovery.  Decreased activities.  This led to recurrence of depressive symptoms including loss of appetite, weight loss, fatigue, anhedonia, sleep disturbance.  She has been followed closely by Dr. Sarajane Jews.  They tried Celexa for 3 to 4 weeks.  No change.  It was stopped.  Changed to Prozac for about 6 weeks.  Last dose last night.  She is now off.  She tells me that her abdomen feels uncomfortable.  Cannot specify.  She has been taking Xanax 0.5 mg twice daily.  This helps some.  Since October, she reports losing about 10 pounds.  Her weight today is 104 pounds.  Her weight in November 2021 was 118 pounds.  She was evaluated for these complaints in the emergency room May 08, 2022.  Reviewed.  No advanced imaging.  Laboratories at that time were unremarkable including comprehensive metabolic panel, liver test, and CBC with  hemoglobin 12.7.  REVIEW OF SYSTEMS:  All non-GI ROS negative as otherwise stated in the HPI except for anxiety, depression, fatigue patient  Past Medical History:  Diagnosis Date   Acute upper respiratory infections of unspecified site    Allergy    ANXIETY 10/12/2008   Arthritis    Cancer ([place])    breast- right   COPD 03/26/2009   pt denies this   GERD (gastroesophageal reflux disease)    HERPES ZOSTER 10/12/2008   Hyperlipidemia    Hypertension    Lumbago    Palpitations    Syncopal episodes 8/10   "presyncopal" prbly vasovagal-MI ruled out; EKG normal; telemetry with PVC's/PAC's. Stress echo-no evidence for ischemia. EF 65-70%. No valvular abnormalities, no pulm. HTN   Urinary tract infection, site not specified     Past Surgical History:  Procedure Laterality Date   BREAST SURGERY     2004- pt denies lymph node removal   COLONOSCOPY  04/20/2003   per Dr. Henrene Pastor, clear. Cologuard 01-24-19 negative (repeat in 3 yrs)    OOPHORECTOMY     ROTATOR CUFF REPAIR Left 2014    Social History [name]  reports that she has quit smoking. She has never used smokeless tobacco. She reports current alcohol use of about 7.0 - 14.0 standard drinks of alcohol per week. She reports that she does not use drugs.  family history includes Alzheimer's disease in her mother; Dementia in  her mother; Diabetes in her brother; Parkinsonism in her father.  Allergies  Allergen Reactions   Cefdinir    Codeine Phosphate    Ephedrine Hcl    Lipitor [Atorvastatin]     Muscle cramps    Other     Phedral   Penicillins     REACTION: per allergy test       PHYSICAL EXAMINATION: Vital signs: BP 130/74 (BP Location: Left Arm, Patient Position: Sitting, Cuff Size: Normal)   Pulse 76   Ht '5\' 6"'$  (1.676 m)   Wt 104 lb 6 oz (47.3 kg)   BMI 16.85 kg/m   Constitutional: Thin but generally well-appearing, no acute distress Psychiatric: alert and oriented x3, cooperative Eyes: extraocular  movements intact, anicteric, conjunctiva pink Mouth: oral pharynx moist, no lesions Neck: supple no lymphadenopathy Cardiovascular: heart regular rate and rhythm, no murmur Lungs: clear to auscultation bilaterally Abdomen: soft, nontender, nondistended, no obvious ascites, no peritoneal signs, normal bowel sounds, no organomegaly Rectal: Omitted Extremities: no clubbing, cyanosis, or lower extremity edema bilaterally Skin: no lesions on visible extremities Neuro: No focal deficits.  Cranial nerves intact  ASSESSMENT:  1.  Anorexia with weight loss as described.  Suspect functional dyspepsia with associated anxiety/depression if she has had in the past.  Rule out organic causes. 2.  Anxiety/depression 3.  Abdominal complaints including uncomfortable abdomen and irregular bowel habits  PLAN:  1.  Schedule upper endoscopy to evaluate anorexia, abdominal complaints, and weight loss. 2.  Schedule contrast-enhanced CT scan to evaluate abdominal complaints and weight loss 3.  Initiate Zoloft 25 mg at night.  Medication risk reviewed. 4.  Encouraged to gradually increase activity.  She has recently started walking again. 5.  Take Ensure to daily as caloric supplement 6.  Routine office follow-up with me in 2 months A total time of 60 minutes was spent preparing to see the patient, obtaining comprehensive history, performing medically appropriate physical examination, counseling and educating the patient regarding the above listed issues, ordering medication, ordering advanced radiology study, ordering endoscopic procedure.  Finally, documenting clinical information in the health record.

## 2022-09-15 NOTE — Patient Instructions (Signed)
_______________________________________________________  If your blood pressure at your visit was 140/90 or greater, please contact your primary care physician to follow up on this.  _______________________________________________________  If you are age 77 or older, your body mass index should be between 23-30. Your Body mass index is 16.85 kg/m. If this is out of the aforementioned range listed, please consider follow up with your Primary Care Provider.  If you are age 15 or younger, your body mass index should be between 19-25. Your Body mass index is 16.85 kg/m. If this is out of the aformentioned range listed, please consider follow up with your Primary Care Provider.   ________________________________________________________  The [facility] providers would like to encourage you to use [facility] to communicate with providers for non-urgent requests or questions.  Due to long hold times on the telephone, sending your provider a message by [facility] may be a faster and more efficient way to get a response.  Please allow 48 business hours for a response.  Please remember that this is for non-urgent requests.  _______________________________________________________  Dennis Bast have been scheduled for an endoscopy. Please follow written instructions given to you at your visit today. If you use inhalers (even only as needed), please bring them with you on the day of your procedure.  We have sent the following medications to your pharmacy for you to pick up at your convenience: Zoloft 25 mg   Gradually increase activity (walking)   Try drinking 2 ensure a day to gain weight.  You have been scheduled for a CT scan of the abdomen and pelvis at [facility], 1st floor Radiology. You are scheduled on 09/30/22 at 5:30 pm. You should arrive @ 3:30 pm minutes prior to your appointment time for registration.   Please follow the written instructions below on the day of your exam:   1) Do not eat  anything after 1:30 pm (4 hours prior to your test)   You may take any medications as prescribed with a small amount of water, if necessary. If you take any of the following medications: METFORMIN, GLUCOPHAGE, GLUCOVANCE, AVANDAMET, RIOMET, FORTAMET, [place]on MET, JANUMET, GLUMETZA or METAGLIP, you MAY be asked to HOLD this medication 48 hours AFTER the exam.   The purpose of you drinking the oral contrast is to aid in the visualization of your intestinal tract. The contrast solution may cause some diarrhea. Depending on your individual set of symptoms, you may also receive an intravenous injection of x-ray contrast/dye. Plan on being at [facility] for 45 minutes or longer, depending on the type of exam you are having performed.   If you have any questions regarding your exam or if you need to reschedule, you may call [facility] at [phone] between the hours of 8:00 am and 5:00 pm, Monday-Friday.

## 2022-09-17 ENCOUNTER — Ambulatory Visit (AMBULATORY_SURGERY_CENTER): Payer: PPO | Admitting: Internal Medicine

## 2022-09-17 ENCOUNTER — Encounter: Payer: Self-pay | Admitting: Internal Medicine

## 2022-09-17 VITALS — BP 132/60 | HR 60 | Temp 97.5°F | Resp 9 | Ht 66.0 in | Wt 104.0 lb

## 2022-09-17 DIAGNOSIS — R630 Anorexia: Secondary | ICD-10-CM | POA: Diagnosis not present

## 2022-09-17 DIAGNOSIS — R1013 Epigastric pain: Secondary | ICD-10-CM | POA: Diagnosis not present

## 2022-09-17 DIAGNOSIS — K449 Diaphragmatic hernia without obstruction or gangrene: Secondary | ICD-10-CM | POA: Diagnosis not present

## 2022-09-17 DIAGNOSIS — R109 Unspecified abdominal pain: Secondary | ICD-10-CM

## 2022-09-17 DIAGNOSIS — R634 Abnormal weight loss: Secondary | ICD-10-CM

## 2022-09-17 DIAGNOSIS — K30 Functional dyspepsia: Secondary | ICD-10-CM | POA: Diagnosis not present

## 2022-09-17 MED ORDER — SODIUM CHLORIDE 0.9 % IV SOLN: 500.0000 mL | INTRAVENOUS | Status: DC

## 2022-09-17 NOTE — Op Note (Addendum)
[facility] Patient Name: [name] Procedure Date: 09/17/2022 12:07 PM MRN: [id number] Endoscopist: Docia Chuck. Henrene Pastor , MD, [id number] Age: 77 Referring MD:  Date of Birth: 04-01-1946 Gender: Female Account #: [id number] Procedure:                Upper GI endoscopy Indications:              Dyspepsia, Weight loss, decreased appetite Medicines:                Monitored Anesthesia Care Procedure:                Pre-Anesthesia Assessment:                           - Prior to the procedure, a History and Physical                            was performed, and patient medications and                            allergies were reviewed. The patient's tolerance of                            previous anesthesia was also reviewed. The risks                            and benefits of the procedure and the sedation                            options and risks were discussed with the patient.                            All questions were answered, and informed consent                            was obtained. Prior Anticoagulants: The patient has                            taken no anticoagulant or antiplatelet agents. ASA                            Grade Assessment: II - A patient with mild systemic                            disease. After reviewing the risks and benefits,                            the patient was deemed in satisfactory condition to                            undergo the procedure.                           After obtaining informed consent, the endoscope was  passed under direct vision. Throughout the                            procedure, the patient's blood pressure, pulse, and                            oxygen saturations were monitored continuously. The                            Olympus scope [id number] was introduced through the                            mouth, and advanced to the second part of duodenum.                            The  upper GI endoscopy was accomplished without                            difficulty. The patient tolerated the procedure                            well. Scope In: Scope Out: Findings:                 The esophagus revealed a benign incidental large                            caliber ring at the gastroesophageal junction.                            Otherwise normal esophagus.                           The stomach was normal. Small hiatal hernia.                           The examined duodenum was normal.                           The cardia and gastric fundus were normal on                            retroflexion. Complications:            No immediate complications. Estimated Blood Loss:     Estimated blood loss: none. Impression:               1. Incidental esophageal ring                           2. Otherwise normal EGD. Recommendation:           - Patient has a contact number available for                            emergencies. The signs and symptoms of potential  delayed complications were discussed with the                            patient. Return to normal activities tomorrow.                            Written discharge instructions were provided to the                            patient.                           - Resume previous diet. Supplement with Ensure                            twice daily                           - Continue present medications.                           - Continue to gradually increase activity as                            tolerated                           - Continue Zoloft 25 mg at night for 3 weeks. Then                            increase to 50 mg at night until your follow-up                            appointment with Dr. Henrene Pastor                           - Keep plans for CT scan as scheduled [name] N. Henrene Pastor, MD 09/17/2022 12:29:44 PM This report has been signed electronically.

## 2022-09-17 NOTE — Progress Notes (Signed)
Sedate, gd SR, tolerated procedure well, VSS, report to RN 

## 2022-09-17 NOTE — Patient Instructions (Addendum)
Discharge instructions given. Continue current medications. Keep plans for CT scan as scheduled.  Recommendation: - Patient has a contact number available for                            emergencies. The signs and symptoms of potential                            delayed complications were discussed with the                            patient. Return to normal activities tomorrow.                            Written discharge instructions were provided to the                            patient.                           - Resume previous diet. Supplement with Ensure                            twice daily                           Continue to gradually increase activity as                            tolerated                           - Continue Zoloft 25 mg at night for 3 weeks. Then                            increase to 50 mg at night until your follow-up                            appointment with Dr. Henrene Pastor                          YOU HAD AN ENDOSCOPIC PROCEDURE TODAY AT THE [facility]:   Refer to the procedure report that was given to you for any specific questions about what was found during the examination.  If the procedure report does not answer your questions, please call your gastroenterologist to clarify.  If you requested that your care partner not be given the details of your procedure findings, then the procedure report has been included in a sealed envelope for you to review at your convenience later.  YOU SHOULD EXPECT: Some feelings of bloating in the abdomen. Passage of more gas than usual.  Walking can help get rid of the air that was put into your GI tract during the procedure and reduce the bloating. If you had a lower endoscopy (such as a colonoscopy or flexible sigmoidoscopy) you may notice spotting of blood in your stool or on the toilet paper. If you underwent a bowel prep for your procedure, you may  not have a normal bowel movement for a few days.  Please  Note:  You might notice some irritation and congestion in your nose or some drainage.  This is from the oxygen used during your procedure.  There is no need for concern and it should clear up in a day or so.  SYMPTOMS TO REPORT IMMEDIATELY:  Following upper endoscopy (EGD)  Vomiting of blood or coffee ground material  New chest pain or pain under the shoulder blades  Painful or persistently difficult swallowing  New shortness of breath  Fever of 100F or higher  Black, tarry-looking stools  For urgent or emergent issues, a gastroenterologist can be reached at any hour by calling [phone]. Do not use MyChart messaging for urgent concerns.   DIET:  We do recommend a small meal at first, but then you may proceed to your regular diet.  Drink plenty of fluids but you should avoid alcoholic beverages for 24 hours.  ACTIVITY:  You should plan to take it easy for the rest of today and you should NOT DRIVE or use heavy machinery until tomorrow (because of the sedation medicines used during the test).    FOLLOW UP: Our staff will call the number listed on your records the next business day following your procedure.  We will call around 7:15- 8:00 am to check on you and address any questions or concerns that you may have regarding the information given to you following your procedure. If we do not reach you, we will leave a message.     If any biopsies were taken you will be contacted by phone or by letter within the next 1-3 weeks.  Please call us at [phone] if you have not heard about the biopsies in 3 weeks.    SIGNATURES/CONFIDENTIALITY: You and/or your care partner have signed paperwork which will be entered into your electronic medical record.  These signatures attest to the fact that that the information above on your After Visit Summary has been reviewed and is understood.  Full responsibility of the confidentiality of this discharge information lies with you and/or your  care-partner.

## 2022-09-17 NOTE — Progress Notes (Signed)
Patient reports no changes since office visit

## 2022-09-18 ENCOUNTER — Telehealth: Payer: Self-pay

## 2022-09-18 NOTE — Telephone Encounter (Signed)
No answer, left message to call if having any issues or concerns, B.Dajae Kizer RN 

## 2022-09-22 ENCOUNTER — Telehealth: Payer: Self-pay

## 2022-09-22 NOTE — Telephone Encounter (Signed)
I am seeing her for other issues at present. If you would, please make Cologuard recall for 6 months from now.  I can readdress at that time. Thanks [name]

## 2022-09-22 NOTE — Telephone Encounter (Signed)
[name] has a recall Cologuard due. Due to age would you like to proceed with the order?

## 2022-09-25 NOTE — Telephone Encounter (Signed)
Staff reminder set for reminder in 6 months

## 2022-09-26 ENCOUNTER — Ambulatory Visit (INDEPENDENT_AMBULATORY_CARE_PROVIDER_SITE_OTHER): Payer: PPO | Admitting: Family Medicine

## 2022-09-26 ENCOUNTER — Encounter: Payer: Self-pay | Admitting: Family Medicine

## 2022-09-26 ENCOUNTER — Ambulatory Visit: Payer: PPO

## 2022-09-26 VITALS — BP 116/68 | HR 61 | Temp 97.8°F | Wt 104.8 lb

## 2022-09-26 DIAGNOSIS — R630 Anorexia: Secondary | ICD-10-CM | POA: Insufficient documentation

## 2022-09-26 DIAGNOSIS — F411 Generalized anxiety disorder: Secondary | ICD-10-CM

## 2022-09-26 DIAGNOSIS — E538 Deficiency of other specified B group vitamins: Secondary | ICD-10-CM | POA: Diagnosis not present

## 2022-09-26 MED ORDER — CYANOCOBALAMIN 1000 MCG/ML IJ SOLN
1000.0000 ug | Freq: Once | INTRAMUSCULAR | Status: AC
  Administered 2022-09-26: 1000 ug via INTRAMUSCULAR

## 2022-09-26 NOTE — Progress Notes (Addendum)
   Subjective:    Patient ID: [name], female    DOB: 1946/03/04, 77 y.o.   MRN: [id number]  HPI Here to follow up on anxiety and anorexia. When we last met we agreed for her to taper off Prozac. She then met with her GI, Dr. Marina Goodell, and he felt she should try Zoloft again (she has taken this in the past). She is now on 25 mg daily, and she plans to move this up to the target dose of 50 mg daily next week. She underwent an EGD on 09-17-22 that was unremarkable. She is scheduled for an abdominal and pelvic CT on 09-30-22. She says she feels much better now, her anxiety is better controlled and her appetite is improving. She eats 3 meals a day, albeit small ones. She is taking 2 Ensure shakes daily. Her BM's are now back on a regular pattern.    Review of Systems  Constitutional:  Positive for appetite change.  Respiratory: Negative.    Cardiovascular: Negative.   Gastrointestinal: Negative.   Psychiatric/Behavioral:  Negative for agitation, behavioral problems, confusion, decreased concentration, dysphoric mood and sleep disturbance. The patient is nervous/anxious. The patient is not hyperactive.        Objective:   Physical Exam Constitutional:      Appearance: Normal appearance.  Cardiovascular:     Rate and Rhythm: Normal rate and regular rhythm.     Pulses: Normal pulses.     Heart sounds: Normal heart sounds.  Pulmonary:     Effort: Pulmonary effort is normal.     Breath sounds: Normal breath sounds.  Neurological:     Mental Status: She is alert.  Psychiatric:        Behavior: Behavior normal.        Thought Content: Thought content normal.     Comments: She looks much more relaxed today            Assessment & Plan:  Her anxiety and anorexia are improving on Zoloft. She will taper up on the dose as above. Recheck as needed. She is given a b12 shot for B12 deficiency.  Gershon Crane, MD

## 2022-09-30 ENCOUNTER — Ambulatory Visit (HOSPITAL_COMMUNITY)
Admission: RE | Admit: 2022-09-30 | Discharge: 2022-09-30 | Disposition: A | Discharge status: Home / Self Care | Payer: PPO | Source: Ambulatory Visit | Attending: Internal Medicine | Admitting: Internal Medicine

## 2022-09-30 DIAGNOSIS — R109 Unspecified abdominal pain: Secondary | ICD-10-CM | POA: Diagnosis not present

## 2022-09-30 DIAGNOSIS — R1013 Epigastric pain: Secondary | ICD-10-CM | POA: Diagnosis not present

## 2022-09-30 DIAGNOSIS — R634 Abnormal weight loss: Secondary | ICD-10-CM | POA: Diagnosis not present

## 2022-09-30 DIAGNOSIS — N3289 Other specified disorders of bladder: Secondary | ICD-10-CM | POA: Diagnosis not present

## 2022-09-30 MED ORDER — IOHEXOL 300 MG/ML  SOLN
80.0000 mL | Freq: Once | INTRAMUSCULAR | Status: AC | PRN
  Administered 2022-09-30: 80 mL via INTRAVENOUS

## 2022-10-13 DIAGNOSIS — M79641 Pain in right hand: Secondary | ICD-10-CM | POA: Diagnosis not present

## 2022-10-17 ENCOUNTER — Other Ambulatory Visit: Payer: Self-pay | Admitting: Family Medicine

## 2022-10-20 ENCOUNTER — Other Ambulatory Visit: Payer: Self-pay | Admitting: Family Medicine

## 2022-10-20 NOTE — Telephone Encounter (Signed)
Last refill-06/20/22--30 tabs, 5 refills Last OV-09/26/22  No future OV scheduled.

## 2022-10-21 DIAGNOSIS — M7061 Trochanteric bursitis, right hip: Secondary | ICD-10-CM | POA: Diagnosis not present

## 2022-10-21 NOTE — Telephone Encounter (Signed)
Last refill-04/04/22--60 tabs, 5 refills Last OV-09/26/22  No future OV scheduled.

## 2022-10-23 ENCOUNTER — Telehealth: Payer: Self-pay | Admitting: Internal Medicine

## 2022-10-23 MED ORDER — SERTRALINE HCL 25 MG PO TABS: 50.0000 mg | ORAL_TABLET | Freq: Every day | ORAL | 1 refills | Status: DC

## 2022-10-23 NOTE — Telephone Encounter (Signed)
Patient called stating that she had a Endoscopy with Dr. Henrene Pastor in February 2024. She was instructed to take Zoloft up until her follow up appointment, 11/12/2022. States [facility] will not refill without a new prescription. States she will run out on Saturday. Please Advise.

## 2022-10-23 NOTE — Telephone Encounter (Signed)
Zoloft 50mg  sent to pharmacy

## 2022-10-24 ENCOUNTER — Ambulatory Visit (INDEPENDENT_AMBULATORY_CARE_PROVIDER_SITE_OTHER): Payer: PPO

## 2022-10-24 DIAGNOSIS — E538 Deficiency of other specified B group vitamins: Secondary | ICD-10-CM | POA: Diagnosis not present

## 2022-10-24 NOTE — Progress Notes (Signed)
Per orders of Dr. Clent Ridges, injection of Cyanocobalamin Inj given by Vickii Chafe on Right Deltoid.  Patient tolerated injection well.

## 2022-10-30 DIAGNOSIS — M79641 Pain in right hand: Secondary | ICD-10-CM | POA: Diagnosis not present

## 2022-10-30 NOTE — Progress Notes (Signed)
I agree with above. Gershon Crane, MD

## 2022-11-04 ENCOUNTER — Telehealth: Payer: Self-pay | Admitting: Family Medicine

## 2022-11-04 NOTE — Telephone Encounter (Signed)
Contacted [name] to schedule their annual wellness visit. Call back at later date: July 2024  [name] AWV direct phone # [phone]   Spoke with patient to schedule AWV.  She didn't want to schedule at this time but was ok for me to call back to schedule

## 2022-11-07 DIAGNOSIS — M79641 Pain in right hand: Secondary | ICD-10-CM | POA: Diagnosis not present

## 2022-11-12 ENCOUNTER — Encounter: Payer: Self-pay | Admitting: Internal Medicine

## 2022-11-12 ENCOUNTER — Ambulatory Visit: Payer: PPO | Admitting: Internal Medicine

## 2022-11-12 VITALS — BP 122/62 | HR 64 | Ht 66.0 in | Wt 108.0 lb

## 2022-11-12 DIAGNOSIS — R630 Anorexia: Secondary | ICD-10-CM | POA: Diagnosis not present

## 2022-11-12 DIAGNOSIS — F32A Depression, unspecified: Secondary | ICD-10-CM | POA: Diagnosis not present

## 2022-11-12 DIAGNOSIS — F419 Anxiety disorder, unspecified: Secondary | ICD-10-CM | POA: Diagnosis not present

## 2022-11-12 DIAGNOSIS — R194 Change in bowel habit: Secondary | ICD-10-CM | POA: Diagnosis not present

## 2022-11-12 DIAGNOSIS — R1013 Epigastric pain: Secondary | ICD-10-CM

## 2022-11-12 DIAGNOSIS — R634 Abnormal weight loss: Secondary | ICD-10-CM | POA: Diagnosis not present

## 2022-11-12 MED ORDER — SERTRALINE HCL 50 MG PO TABS: 75.0000 mg | ORAL_TABLET | Freq: Every day | ORAL | 6 refills | Status: DC

## 2022-11-12 NOTE — Progress Notes (Signed)
HISTORY OF PRESENT ILLNESS:  [name] is a 77 y.o. female who was evaluated September 15, 2022 regarding anorexia with weight loss, anxiety/depression, and abdominal complaints.  See that dictation.  Her problems were felt to be functional and she was initiated on Zoloft 25 mg at night.  Upper endoscopy was subsequently performed and returned unremarkable.  Contrast-enhanced CT scan of the abdomen pelvis revealed no acute abnormalities.  Subtle changes of the bladder suggesting possible UTI.  Mild fibrosis of the lungs noted.  After 3 weeks her Zoloft was increased to 50 mg at night.  She was told to take Ensure daily, which she has.  She was asked to follow-up in the office at this time, which she does.  She is pleased to report that she is doing much better since her office visit.  Tolerating Zoloft well.  Her bowel habits have improved that she is eating more.  She has gained 4 pounds.  Still with low energy levels and depressive symptoms, but somewhat less.  She is wondering about increasing her Zoloft, as we have done in the past.  No new complaints.  She continues to slowly improve from hand surgery  REVIEW OF SYSTEMS:  All non-GI ROS negative.  Past Medical History:  Diagnosis Date   Acute upper respiratory infections of unspecified site    Allergy    ANXIETY 10/12/2008   Arthritis    Cancer    breast- right   COPD 03/26/2009   pt denies this   GERD (gastroesophageal reflux disease)    HERPES ZOSTER 10/12/2008   Hyperlipidemia    Hypertension    Lumbago    Palpitations    Syncopal episodes 8/10   "presyncopal" prbly vasovagal-MI ruled out; EKG normal; telemetry with PVC's/PAC's. Stress echo-no evidence for ischemia. EF 65-70%. No valvular abnormalities, no pulm. HTN   Urinary tract infection, site not specified     Past Surgical History:  Procedure Laterality Date   BREAST SURGERY     2004- pt denies lymph node removal   COLONOSCOPY  04/20/2003   per Dr. Marina Goodell, clear.  Cologuard 01-24-19 negative (repeat in 3 yrs)    OOPHORECTOMY     ROTATOR CUFF REPAIR Left 2014    Social History [name]  reports that she has quit smoking. She has never used smokeless tobacco. She reports current alcohol use of about 7.0 - 14.0 standard drinks of alcohol per week. She reports that she does not use drugs.  family history includes Alzheimer's disease in her mother; Dementia in her mother; Diabetes in her brother; Parkinsonism in her father.  Allergies  Allergen Reactions   Cefdinir    Codeine Phosphate    Ephedrine Hcl    Lipitor [Atorvastatin]     Muscle cramps    Other     Phedral   Penicillins     REACTION: per allergy test       PHYSICAL EXAMINATION: Vital signs: BP 122/62   Pulse 64   Ht  (1.676 m)   Wt 108 lb (49 kg)   BMI 17.43 kg/m   Constitutional: generally well-appearing, no acute distress Psychiatric: alert and oriented x3, cooperative Eyes: extraocular movements intact, anicteric, conjunctiva pink Mouth: oral pharynx moist, no lesions Neck: supple no lymphadenopathy Cardiovascular: heart regular rate and rhythm, no murmur Lungs: clear to auscultation bilaterally Abdomen: soft, nontender, nondistended, no obvious ascites, no peritoneal signs, normal bowel sounds, no organomegaly Rectal: Omitted Extremities: no lower extremity edema bilaterally Skin: no  lesions on visible extremities Neuro: No focal deficits. No asterixis.    ASSESSMENT:  1.  Functional dyspepsia.  Improving on Zoloft 2.  Anxiety/depression associated with decreased appetite and weight loss.  Improving on Zoloft 3.  Change in bowel habits.  Improved with increased p.o. intake  PLAN:  1.  Increase Zoloft to 75 mg at night.  Prescription refilled.  Medication risk reviewed. 2.  Office follow-up with me in 2 months 3.  Contact the office in the interim for any questions or problems

## 2022-11-12 NOTE — Patient Instructions (Signed)
_______________________________________________________  If your blood pressure at your visit was 140/90 or greater, please contact your primary care physician to follow up on this.  _______________________________________________________  If you are age 77 or older, your body mass index should be between 23-30. Your Body mass index is 17.43 kg/m. If this is out of the aforementioned range listed, please consider follow up with your Primary Care Provider.  If you are age 54 or younger, your body mass index should be between 19-25. Your Body mass index is 17.43 kg/m. If this is out of the aformentioned range listed, please consider follow up with your Primary Care Provider.   ________________________________________________________  The [facility] providers would like to encourage you to use [facility] to communicate with providers for non-urgent requests or questions.  Due to long hold times on the telephone, sending your provider a message by [facility] may be a faster and more efficient way to get a response.  Please allow 48 business hours for a response.  Please remember that this is for non-urgent requests.  _______________________________________________________  We have sent the following medications to your pharmacy for you to pick up at your convenience:  Zoloft

## 2022-11-14 DIAGNOSIS — M79641 Pain in right hand: Secondary | ICD-10-CM | POA: Diagnosis not present

## 2022-11-21 ENCOUNTER — Ambulatory Visit (INDEPENDENT_AMBULATORY_CARE_PROVIDER_SITE_OTHER): Payer: PPO

## 2022-11-21 DIAGNOSIS — E538 Deficiency of other specified B group vitamins: Secondary | ICD-10-CM

## 2022-11-21 NOTE — Progress Notes (Signed)
Per orders of Dr. Clent Ridges, injection of Cyanocobalamin Inj. 1000 mcg given by Vickii Chafe on L deltoid.  Patient tolerated injection well.  Next injection is scheduled on 12/22/2022.

## 2022-12-12 ENCOUNTER — Other Ambulatory Visit: Payer: Self-pay | Admitting: Family Medicine

## 2022-12-22 ENCOUNTER — Ambulatory Visit (INDEPENDENT_AMBULATORY_CARE_PROVIDER_SITE_OTHER): Payer: PPO

## 2022-12-22 DIAGNOSIS — E538 Deficiency of other specified B group vitamins: Secondary | ICD-10-CM

## 2022-12-22 MED ORDER — CYANOCOBALAMIN 1000 MCG/ML IJ SOLN
1000.0000 ug | Freq: Once | INTRAMUSCULAR | Status: AC
  Administered 2022-12-22: 1000 ug via INTRAMUSCULAR

## 2022-12-22 NOTE — Progress Notes (Signed)
Pt here for monthly B12 injection per Dr Clent Ridges  B12 given IM and pt tolerated injection well.  Next B12 injection scheduled for 01/19/23

## 2022-12-29 ENCOUNTER — Ambulatory Visit (INDEPENDENT_AMBULATORY_CARE_PROVIDER_SITE_OTHER): Payer: PPO | Admitting: Family Medicine

## 2022-12-29 ENCOUNTER — Encounter: Payer: Self-pay | Admitting: Family Medicine

## 2022-12-29 VITALS — BP 134/68 | HR 66 | Temp 98.2°F | Wt 109.0 lb

## 2022-12-29 DIAGNOSIS — E538 Deficiency of other specified B group vitamins: Secondary | ICD-10-CM | POA: Diagnosis not present

## 2022-12-29 DIAGNOSIS — R630 Anorexia: Secondary | ICD-10-CM | POA: Diagnosis not present

## 2022-12-29 DIAGNOSIS — I10 Essential (primary) hypertension: Secondary | ICD-10-CM | POA: Diagnosis not present

## 2022-12-29 DIAGNOSIS — F411 Generalized anxiety disorder: Secondary | ICD-10-CM | POA: Diagnosis not present

## 2022-12-29 MED ORDER — CYANOCOBALAMIN 1000 MCG/ML IJ SOLN
1000.0000 ug | INTRAMUSCULAR | Status: DC
Start: 2023-01-09 — End: 2024-04-11
  Administered 2023-02-03: 1000 ug via INTRAMUSCULAR

## 2022-12-29 NOTE — Progress Notes (Signed)
   Subjective:    Patient ID: [name], female    DOB: [birth year], 77 y.o.   MRN: [id number]  HPI Here to follow up on B12 deficiency. Since starting on B12 shots, she has felt much better overall. She has more energy, and her appetite has picked up. She has gained 5 lbs since March. She says she feels great after a shot, but then it seems to wear off a few days before the next shot. She feels lightheaded and "foggy".    Review of Systems  Constitutional: Negative.   Respiratory: Negative.    Cardiovascular: Negative.   Neurological: Negative.        Objective:   Physical Exam Constitutional:      Appearance: Normal appearance.  Cardiovascular:     Rate and Rhythm: Normal rate and regular rhythm.     Pulses: Normal pulses.     Heart sounds: Normal heart sounds.  Pulmonary:     Effort: Pulmonary effort is normal.     Breath sounds: Normal breath sounds.  Neurological:     Mental Status: She is alert and oriented to person, place, and time.  Psychiatric:        Mood and Affect: Mood normal.           Assessment & Plan:  B12 deficiency. We will check a level today. We also increase our dosing to get a B12 shot every 14 days.  Gershon Crane, MD

## 2022-12-30 LAB — VITAMIN B12: Vitamin B-12: 724 pg/mL (ref 211–911)

## 2023-01-01 DIAGNOSIS — J45991 Cough variant asthma: Secondary | ICD-10-CM | POA: Diagnosis not present

## 2023-01-01 DIAGNOSIS — J300 Vasomotor rhinitis: Secondary | ICD-10-CM | POA: Diagnosis not present

## 2023-01-01 DIAGNOSIS — R052 Subacute cough: Secondary | ICD-10-CM | POA: Diagnosis not present

## 2023-01-05 ENCOUNTER — Ambulatory Visit (INDEPENDENT_AMBULATORY_CARE_PROVIDER_SITE_OTHER): Payer: PPO

## 2023-01-05 DIAGNOSIS — E538 Deficiency of other specified B group vitamins: Secondary | ICD-10-CM | POA: Diagnosis not present

## 2023-01-05 MED ORDER — CYANOCOBALAMIN 1000 MCG/ML IJ SOLN
1000.0000 ug | Freq: Once | INTRAMUSCULAR | Status: AC
Start: 2023-01-05 — End: 2023-01-05
  Administered 2023-01-05: 1000 ug via INTRAMUSCULAR

## 2023-01-05 NOTE — Progress Notes (Signed)
Pt here for monthly B12 injection per Dr Fry  B12 1000mcg given IM and pt tolerated injection well.  Next B12 injection scheduled for 01/19/23  

## 2023-01-14 ENCOUNTER — Encounter: Payer: Self-pay | Admitting: Internal Medicine

## 2023-01-14 ENCOUNTER — Ambulatory Visit: Payer: PPO | Admitting: Internal Medicine

## 2023-01-14 VITALS — BP 98/60 | HR 59 | Ht 66.0 in | Wt 108.8 lb

## 2023-01-14 DIAGNOSIS — R630 Anorexia: Secondary | ICD-10-CM | POA: Diagnosis not present

## 2023-01-14 DIAGNOSIS — F32A Depression, unspecified: Secondary | ICD-10-CM | POA: Diagnosis not present

## 2023-01-14 DIAGNOSIS — R194 Change in bowel habit: Secondary | ICD-10-CM | POA: Diagnosis not present

## 2023-01-14 DIAGNOSIS — R634 Abnormal weight loss: Secondary | ICD-10-CM

## 2023-01-14 DIAGNOSIS — F419 Anxiety disorder, unspecified: Secondary | ICD-10-CM | POA: Diagnosis not present

## 2023-01-14 DIAGNOSIS — R109 Unspecified abdominal pain: Secondary | ICD-10-CM

## 2023-01-14 DIAGNOSIS — R1013 Epigastric pain: Secondary | ICD-10-CM

## 2023-01-14 NOTE — Progress Notes (Signed)
HISTORY OF PRESENT ILLNESS:  [name] is a 77 y.o. 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  She was last seen in the office November 12, 2022.  Today dictation for details.  At that time she was doing better on previously initiated Zoloft.  Zoloft dosage was increased to 75 mg at night.  Follow-up in the office at this time arranged.  She is pleased to report that overall she is doing better.  She notices increased energy, increased activity, better appetite, and an absence of previously reported GI symptoms.  She does have days where she feels better than others.  She fortunately has having less pain related to her right thumb surgery of 8 months ago.  Has slacked off a bit on and sure use, but her weight is stable.  She is anticipating full annual evaluation with her PCP, Dr. Clent Ridges, soon.  She has no new complaints  REVIEW OF SYSTEMS:  All non-GI ROS negative except for anxiety  Past Medical History:  Diagnosis Date   Acute upper respiratory infections of unspecified site    Allergy    ANXIETY 10/12/2008   Arthritis    Cancer (HCC)    breast- right   COPD 03/26/2009   pt denies this   GERD (gastroesophageal reflux disease)    HERPES ZOSTER 10/12/2008   Hyperlipidemia    Hypertension    Lumbago    Palpitations    Syncopal episodes 8/10   "presyncopal" prbly vasovagal-MI ruled out; EKG normal; telemetry with PVC's/PAC's. Stress echo-no evidence for ischemia. EF 65-70%. No valvular abnormalities, no pulm. HTN   Urinary tract infection, site not specified     Past Surgical History:  Procedure Laterality Date   BREAST SURGERY     2004- pt denies lymph node removal   COLONOSCOPY  04/20/2003   per Dr. Marina Goodell, clear. Cologuard 01-24-19 negative (repeat in 3 yrs)    OOPHORECTOMY     ROTATOR CUFF REPAIR Left 2014     Social History [name]  reports that she has quit smoking. She has never used smokeless tobacco. She reports current alcohol use of about 7.0 - 14.0 standard drinks of alcohol per week. She reports that she does not use drugs.  family history includes Alzheimer's disease in her mother; Dementia in her mother; Diabetes in her brother; Parkinsonism in her father.  Allergies  Allergen Reactions   Cefdinir    Codeine Phosphate    Ephedrine Hcl    Lipitor [Atorvastatin]     Muscle cramps    Other     Phedral   Penicillins     REACTION: per allergy test       PHYSICAL EXAMINATION: Vital signs: BP 98/60   Pulse (!) 59   Ht 5\' 6"  (1.676 m)   Wt 108 lb 12.8 oz (49.4 kg)   BMI 17.56 kg/m   Constitutional: generally well-appearing, no acute distress Psychiatric: alert and oriented x3, cooperative Eyes: Anicteric Abdomen: Not reexamined Extremities: No visible abnormalities Skin: Unremarkable Neuro: Nonfocal  ASSESSMENT:  1.  Functional dyspepsia secondary to anxiety/depression.  Better on Zoloft 2.  Anorexia with associated weight loss secondary to anxiety/depression.  Better on Zoloft   PLAN:  1.  Continue Zoloft 75 mg at night 2.  Continue to increase activity level and provide caloric supplement 3.  Routine office follow-up in 3 months.

## 2023-01-14 NOTE — Patient Instructions (Signed)
_______________________________________________________  If your blood pressure at your visit was 140/90 or greater, please contact your primary care physician to follow up on this.  _______________________________________________________  If you are age 77 or older, your body mass index should be between 23-30. Your Body mass index is 17.56 kg/m. If this is out of the aforementioned range listed, please consider follow up with your Primary Care Provider.  If you are age 41 or younger, your body mass index should be between 19-25. Your Body mass index is 17.56 kg/m. If this is out of the aformentioned range listed, please consider follow up with your Primary Care Provider.   ________________________________________________________  The [facility] providers would like to encourage you to use [facility] to communicate with providers for non-urgent requests or questions.  Due to long hold times on the telephone, sending your provider a message by [facility] may be a faster and more efficient way to get a response.  Please allow 48 business hours for a response.  Please remember that this is for non-urgent requests.  _______________________________________________________

## 2023-01-19 ENCOUNTER — Encounter: Payer: Self-pay | Admitting: Family Medicine

## 2023-01-19 ENCOUNTER — Ambulatory Visit: Payer: PPO

## 2023-01-19 ENCOUNTER — Ambulatory Visit (INDEPENDENT_AMBULATORY_CARE_PROVIDER_SITE_OTHER): Payer: PPO | Admitting: Family Medicine

## 2023-01-19 VITALS — BP 126/68 | HR 59 | Temp 98.1°F | Ht 66.0 in | Wt 107.4 lb

## 2023-01-19 DIAGNOSIS — Z1211 Encounter for screening for malignant neoplasm of colon: Secondary | ICD-10-CM

## 2023-01-19 DIAGNOSIS — Z0000 Encounter for general adult medical examination without abnormal findings: Secondary | ICD-10-CM

## 2023-01-19 DIAGNOSIS — E538 Deficiency of other specified B group vitamins: Secondary | ICD-10-CM | POA: Diagnosis not present

## 2023-01-19 LAB — CBC WITH DIFFERENTIAL/PLATELET
Basophils Absolute: 0.1 10*3/uL (ref 0.0–0.1)
Basophils Relative: 1 % (ref 0.0–3.0)
Eosinophils Absolute: 0.2 10*3/uL (ref 0.0–0.7)
Eosinophils Relative: 2.6 % (ref 0.0–5.0)
HCT: 38.7 % (ref 36.0–46.0)
Hemoglobin: 12.9 g/dL (ref 12.0–15.0)
Lymphocytes Relative: 18.3 % (ref 12.0–46.0)
Lymphs Abs: 1.1 10*3/uL (ref 0.7–4.0)
MCHC: 33.3 g/dL (ref 30.0–36.0)
MCV: 98.6 fl (ref 78.0–100.0)
Monocytes Absolute: 0.4 10*3/uL (ref 0.1–1.0)
Monocytes Relative: 6.9 % (ref 3.0–12.0)
Neutro Abs: 4.4 10*3/uL (ref 1.4–7.7)
Neutrophils Relative %: 71.2 % (ref 43.0–77.0)
Platelets: 214 10*3/uL (ref 150.0–400.0)
RBC: 3.93 Mil/uL (ref 3.87–5.11)
RDW: 13.2 % (ref 11.5–15.5)
WBC: 6.2 10*3/uL (ref 4.0–10.5)

## 2023-01-19 LAB — HEPATIC FUNCTION PANEL
ALT: 26 U/L (ref 0–35)
AST: 28 U/L (ref 0–37)
Albumin: 4.5 g/dL (ref 3.5–5.2)
Alkaline Phosphatase: 84 U/L (ref 39–117)
Bilirubin, Direct: 0.2 mg/dL (ref 0.0–0.3)
Total Bilirubin: 0.8 mg/dL (ref 0.2–1.2)
Total Protein: 7.5 g/dL (ref 6.0–8.3)

## 2023-01-19 LAB — LIPID PANEL
Cholesterol: 172 mg/dL (ref 0–200)
HDL: 85.5 mg/dL (ref >39.00)
LDL Cholesterol: 73 mg/dL (ref 0–99)
NonHDL: 86.91
Total CHOL/HDL Ratio: 2
Triglycerides: 69 mg/dL (ref 0.0–149.0)
VLDL: 13.8 mg/dL (ref 0.0–40.0)

## 2023-01-19 LAB — BASIC METABOLIC PANEL
BUN: 13 mg/dL (ref 6–23)
CO2: 28 mEq/L (ref 19–32)
Calcium: 9.7 mg/dL (ref 8.4–10.5)
Chloride: 101 mEq/L (ref 96–112)
Creatinine, Ser: 0.65 mg/dL (ref 0.40–1.20)
GFR: 84.9 mL/min (ref >60.00)
Glucose, Bld: 88 mg/dL (ref 70–99)
Potassium: 4.2 mEq/L (ref 3.5–5.1)
Sodium: 137 mEq/L (ref 135–145)

## 2023-01-19 LAB — HEMOGLOBIN A1C: Hgb A1c MFr Bld: 5.8 % (ref 4.6–6.5)

## 2023-01-19 LAB — TSH: TSH: 1.99 u[IU]/mL (ref 0.35–5.50)

## 2023-01-19 MED ORDER — CYANOCOBALAMIN 1000 MCG/ML IJ SOLN
1000.0000 ug | Freq: Once | INTRAMUSCULAR | Status: AC
Start: 2023-01-19 — End: 2023-01-19
  Administered 2023-01-19: 1000 ug via INTRAMUSCULAR

## 2023-01-19 MED ORDER — ROSUVASTATIN CALCIUM 20 MG PO TABS: ORAL_TABLET | ORAL | 3 refills | Status: DC

## 2023-01-19 NOTE — Progress Notes (Signed)
   Subjective:    Patient ID: SAMANTAH HANSON, female    DOB: [birth year], 77 y.o.   MRN: [id number]  HPI Here for a well exam. She doing well all in all. Her appetite has returned to normal, and she is maintaining her weight.    Review of Systems  Constitutional: Negative.   HENT: Negative.    Eyes: Negative.   Respiratory: Negative.    Cardiovascular: Negative.   Gastrointestinal: Negative.   Genitourinary:  Negative for decreased urine volume, difficulty urinating, dyspareunia, dysuria, enuresis, flank pain, frequency, hematuria, pelvic pain and urgency.  Musculoskeletal: Negative.   Skin: Negative.   Neurological: Negative.  Negative for headaches.  Psychiatric/Behavioral: Negative.         Objective:   Physical Exam Constitutional:      General: She is not in acute distress.    Appearance: Normal appearance. She is well-developed.  HENT:     Head: Normocephalic and atraumatic.     Right Ear: External ear normal.     Left Ear: External ear normal.     Nose: Nose normal.     Mouth/Throat:     Pharynx: No oropharyngeal exudate.  Eyes:     General: No scleral icterus.    Conjunctiva/sclera: Conjunctivae normal.     Pupils: Pupils are equal, round, and reactive to light.  Neck:     Thyroid: No thyromegaly.     Vascular: No JVD.  Cardiovascular:     Rate and Rhythm: Normal rate and regular rhythm.     Pulses: Normal pulses.     Heart sounds: Normal heart sounds. No murmur heard.    No friction rub. No gallop.     Comments: Occasional ectopy  Pulmonary:     Effort: Pulmonary effort is normal. No respiratory distress.     Breath sounds: Normal breath sounds. No wheezing or rales.  Chest:     Chest wall: No tenderness.  Abdominal:     General: Abdomen is flat. Bowel sounds are normal. There is no distension.     Palpations: Abdomen is soft. There is no mass.     Tenderness: There is no abdominal tenderness. There is no guarding or rebound.  Musculoskeletal:         General: No tenderness. Normal range of motion.     Cervical back: Normal range of motion and neck supple.  Lymphadenopathy:     Cervical: No cervical adenopathy.  Skin:    General: Skin is warm and dry.     Findings: No erythema or rash.  Neurological:     General: No focal deficit present.     Mental Status: She is alert and oriented to person, place, and time.     Cranial Nerves: No cranial nerve deficit.     Motor: No abnormal muscle tone.     Coordination: Coordination normal.     Deep Tendon Reflexes: Reflexes are normal and symmetric. Reflexes normal.  Psychiatric:        Mood and Affect: Mood normal.        Behavior: Behavior normal.        Thought Content: Thought content normal.        Judgment: Judgment normal.           Assessment & Plan:  Well exam. We discussed diet and exercise. Get fasting labs. We will order another Cologuard test. Gershon Crane, MD

## 2023-02-03 ENCOUNTER — Ambulatory Visit: Payer: PPO

## 2023-02-03 DIAGNOSIS — E538 Deficiency of other specified B group vitamins: Secondary | ICD-10-CM | POA: Diagnosis not present

## 2023-02-03 NOTE — Progress Notes (Signed)
Per orders of Nelwyn Salisbury, MD, injection of B12 given in left  deltoid by Sherrin Daisy. Patient tolerated injection well.  Lab Results  Component Value Date   VITAMINB12 724 12/29/2022

## 2023-02-03 NOTE — Patient Instructions (Signed)
Health Maintenance Due  Topic Date Due   Hepatitis C Screening  Never done   COVID-19 Vaccine (5 - 2023-24 season) 03/21/2022   Medicare Annual Wellness (AWV)  03/12/2023       03/11/2022    8:51 AM 12/31/2021    9:35 AM 10/14/2021   10:39 AM  Depression screen PHQ 2/9  Decreased Interest 0 3 0  Down, Depressed, Hopeless 0 3 0  PHQ - 2 Score 0 6 0  Altered sleeping  0 2  Tired, decreased energy  3 2  Change in appetite  -- 0  Feeling bad or failure about yourself   0 0  Trouble concentrating  3 0  Moving slowly or fidgety/restless  0 0  Suicidal thoughts  0 0  PHQ-9 Score  12 4  Difficult doing work/chores   Somewhat difficult

## 2023-02-10 DIAGNOSIS — Z1211 Encounter for screening for malignant neoplasm of colon: Secondary | ICD-10-CM | POA: Diagnosis not present

## 2023-02-14 LAB — COLOGUARD: COLOGUARD: NEGATIVE

## 2023-02-17 ENCOUNTER — Ambulatory Visit (INDEPENDENT_AMBULATORY_CARE_PROVIDER_SITE_OTHER): Payer: PPO

## 2023-02-17 DIAGNOSIS — E538 Deficiency of other specified B group vitamins: Secondary | ICD-10-CM

## 2023-02-17 MED ORDER — CYANOCOBALAMIN 1000 MCG/ML IJ SOLN
1000.0000 ug | Freq: Once | INTRAMUSCULAR | Status: AC
Start: 2023-02-17 — End: 2023-02-17
  Administered 2023-02-17: 1000 ug via INTRAMUSCULAR

## 2023-02-17 NOTE — Progress Notes (Signed)
Pt here for monthly B12 injection per Dr. Clent Ridges.  B12 given IM and pt tolerated injection well.  Next B12 injection scheduled for 2 weeks.

## 2023-03-02 ENCOUNTER — Ambulatory Visit (INDEPENDENT_AMBULATORY_CARE_PROVIDER_SITE_OTHER): Payer: PPO

## 2023-03-02 DIAGNOSIS — E538 Deficiency of other specified B group vitamins: Secondary | ICD-10-CM

## 2023-03-02 MED ORDER — CYANOCOBALAMIN 1000 MCG/ML IJ SOLN
1000.0000 ug | Freq: Once | INTRAMUSCULAR | Status: AC
Start: 2023-03-02 — End: 2023-03-02
  Administered 2023-03-02: 1000 ug via INTRAMUSCULAR

## 2023-03-02 NOTE — Progress Notes (Signed)
Pt here for monthly B12 injection per Dr Clent Ridges   B12 given IM Left Deltoid and pt tolerated injection well.  Next B12 injection scheduled for 03/16/23

## 2023-03-09 ENCOUNTER — Other Ambulatory Visit: Payer: Self-pay | Admitting: Family Medicine

## 2023-03-12 DIAGNOSIS — Z1231 Encounter for screening mammogram for malignant neoplasm of breast: Secondary | ICD-10-CM | POA: Diagnosis not present

## 2023-03-12 LAB — HM MAMMOGRAPHY

## 2023-03-16 ENCOUNTER — Encounter: Payer: Self-pay | Admitting: Family Medicine

## 2023-03-16 ENCOUNTER — Ambulatory Visit (INDEPENDENT_AMBULATORY_CARE_PROVIDER_SITE_OTHER): Payer: PPO

## 2023-03-16 DIAGNOSIS — E538 Deficiency of other specified B group vitamins: Secondary | ICD-10-CM

## 2023-03-16 MED ORDER — CYANOCOBALAMIN 1000 MCG/ML IJ SOLN
1000.0000 ug | Freq: Once | INTRAMUSCULAR | Status: AC
Start: 2023-03-16 — End: 2023-03-16
  Administered 2023-03-16: 1000 ug via INTRAMUSCULAR

## 2023-03-16 NOTE — Progress Notes (Signed)
Per orders of Dr. Clent Ridges, injection of B12 given by Vickii Chafe on Right Deltoid. Patient tolerated injection well.

## 2023-03-30 ENCOUNTER — Ambulatory Visit (INDEPENDENT_AMBULATORY_CARE_PROVIDER_SITE_OTHER): Payer: PPO

## 2023-03-30 DIAGNOSIS — E538 Deficiency of other specified B group vitamins: Secondary | ICD-10-CM

## 2023-03-30 MED ORDER — CYANOCOBALAMIN 1000 MCG/ML IJ SOLN
1000.0000 ug | Freq: Once | INTRAMUSCULAR | Status: AC
Start: 2023-03-30 — End: 2023-03-30
  Administered 2023-03-30: 1000 ug via INTRAMUSCULAR

## 2023-03-30 NOTE — Progress Notes (Signed)
Per orders of Dr. Clent Ridges, injection of B12 given by Vickii Chafe on Left Deltoid. Patient tolerated injection well.

## 2023-04-02 ENCOUNTER — Ambulatory Visit (INDEPENDENT_AMBULATORY_CARE_PROVIDER_SITE_OTHER): Payer: PPO | Admitting: Family Medicine

## 2023-04-02 ENCOUNTER — Encounter: Payer: Self-pay | Admitting: Family Medicine

## 2023-04-02 VITALS — BP 118/62 | HR 93 | Temp 98.2°F | Wt 111.4 lb

## 2023-04-02 DIAGNOSIS — H6991 Unspecified Eustachian tube disorder, right ear: Secondary | ICD-10-CM | POA: Diagnosis not present

## 2023-04-02 NOTE — Progress Notes (Signed)
   Subjective:    Patient ID: [name], female    DOB: 04/13/1946, 77 y.o.   MRN: [id number]  HPI Here for a persistent headache and right ear pain after a recent Covid infection. On 03-13-23 she developed body aches, ST, and dry cough, and she tested positive for Covid the next day. She has been taking Tylenol and drinking fluids, and most of these symptoms resolved. However the ear ache and headache persist. No fever. What mucus she blows from her nose is clear.    Review of Systems  Constitutional: Negative.   HENT:  Positive for ear pain. Negative for congestion, hearing loss, postnasal drip, sinus pain and sore throat.   Eyes: Negative.   Respiratory: Negative.    Neurological:  Positive for headaches. Negative for dizziness.       Objective:   Physical Exam Constitutional:      Appearance: Normal appearance.  HENT:     Right Ear: Tympanic membrane, ear canal and external ear normal.     Left Ear: Tympanic membrane, ear canal and external ear normal.     Nose: Nose normal.     Mouth/Throat:     Pharynx: Oropharynx is clear.  Eyes:     Conjunctiva/sclera: Conjunctivae normal.  Pulmonary:     Effort: Pulmonary effort is normal.     Breath sounds: Normal breath sounds.  Lymphadenopathy:     Cervical: No cervical adenopathy.  Neurological:     Mental Status: She is alert.           Assessment & Plan:  She has post-viral eustachian tube dysfunction. She will treat this with Flonase sprays and Zyrtec as needed.  Gershon Crane, MD

## 2023-04-08 ENCOUNTER — Ambulatory Visit: Payer: PPO | Admitting: Internal Medicine

## 2023-04-08 ENCOUNTER — Encounter: Payer: Self-pay | Admitting: Internal Medicine

## 2023-04-08 VITALS — BP 122/70 | HR 72 | Ht 66.0 in | Wt 112.0 lb

## 2023-04-08 DIAGNOSIS — F419 Anxiety disorder, unspecified: Secondary | ICD-10-CM

## 2023-04-08 DIAGNOSIS — R1013 Epigastric pain: Secondary | ICD-10-CM

## 2023-04-08 DIAGNOSIS — F32A Depression, unspecified: Secondary | ICD-10-CM

## 2023-04-08 DIAGNOSIS — R634 Abnormal weight loss: Secondary | ICD-10-CM

## 2023-04-08 DIAGNOSIS — R630 Anorexia: Secondary | ICD-10-CM

## 2023-04-08 MED ORDER — SERTRALINE HCL 50 MG PO TABS: 75.0000 mg | ORAL_TABLET | Freq: Every day | ORAL | 11 refills | Status: DC

## 2023-04-08 NOTE — Patient Instructions (Signed)
We have sent the following medications to your pharmacy for you to pick up at your convenience:  Zoloft.  _______________________________________________________  If your blood pressure at your visit was 140/90 or greater, please contact your primary care physician to follow up on this.  _______________________________________________________  If you are age 77 or older, your body mass index should be between 23-30. Your Body mass index is 18.08 kg/m. If this is out of the aforementioned range listed, please consider follow up with your Primary Care Provider.  If you are age 57 or younger, your body mass index should be between 19-25. Your Body mass index is 18.08 kg/m. If this is out of the aformentioned range listed, please consider follow up with your Primary Care Provider.   ________________________________________________________  The [facility] providers would like to encourage you to use [facility] to communicate with providers for non-urgent requests or questions.  Due to long hold times on the telephone, sending your provider a message by [facility] may be a faster and more efficient way to get a response.  Please allow 48 business hours for a response.  Please remember that this is for non-urgent requests.  _______________________________________________________

## 2023-04-08 NOTE — Progress Notes (Signed)
HISTORY OF PRESENT ILLNESS:  [name] is a 77 y.o. 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  These were all felt secondary to anxiety with depression.  She was last seen in the office January 14, 2023.  At that time symptoms were improved on Zoloft 75 mg at night.  She presents today for routine follow-up.  Overall she has been doing well.  Ongoing improvements in strength, appetite, and attitude.  She has gained weight.  Unfortunately, on March 13, 2023 she developed COVID.  She was ill for a few days.  Thereafter she has had a metallic taste in her mouth.  She does not lose complete sense of taste.  She has sense of smell.  She wonders about coming off Zoloft, despite doing better.  Otherwise, no new problems  REVIEW OF SYSTEMS:  All non-GI ROS negative. Past Medical History:  Diagnosis Date   Acute upper respiratory infections of unspecified site    Allergy    ANXIETY 10/12/2008   Arthritis    Cancer (HCC)    breast- right   COPD 03/26/2009   pt denies this   GERD (gastroesophageal reflux disease)    HERPES ZOSTER 10/12/2008   Hyperlipidemia    Hypertension    Lumbago    Palpitations    Syncopal episodes 8/10   "presyncopal" prbly vasovagal-MI ruled out; EKG normal; telemetry with PVC's/PAC's. Stress echo-no evidence for ischemia. EF 65-70%. No valvular abnormalities, no pulm. HTN   Urinary tract infection, site not specified     Past Surgical History:  Procedure Laterality Date   BREAST SURGERY     2004- pt denies lymph node removal   COLONOSCOPY  04/20/2003   per Dr. Marina Goodell, clear. Cologuard 01-24-19 negative (repeat in 3 yrs)    OOPHORECTOMY     ROTATOR CUFF REPAIR Left 2014    Social History [name]  reports that she has quit smoking. She has never used smokeless tobacco. She reports current alcohol use of about 7.0 - 14.0 standard  drinks of alcohol per week. She reports that she does not use drugs.  family history includes Alzheimer's disease in her mother; Dementia in her mother; Diabetes in her brother; Parkinsonism in her father.  Allergies  Allergen Reactions   Cefdinir    Codeine Phosphate    Ephedrine Hcl    Lipitor [Atorvastatin]     Muscle cramps    Other     Phedral   Penicillins     REACTION: per allergy test       PHYSICAL EXAMINATION: Vital signs: BP 122/70   Pulse 72   Ht 5\' 6"  (1.676 m)   Wt 112 lb (50.8 kg)   BMI 18.08 kg/m   Constitutional: generally well-appearing, no acute distress Psychiatric: alert and oriented x3, cooperative Eyes: extraocular movements intact, anicteric, conjunctiva pink Mouth: oral pharynx moist, no lesions Neck: supple no lymphadenopathy Cardiovascular: heart regular rate and rhythm, no murmur Lungs: clear to auscultation bilaterally Abdomen: soft, nontender, nondistended, no obvious ascites, no peritoneal signs, normal bowel sounds, no organomegaly Rectal: Omitted Extremities: no clubbing, cyanosis, or lower extremity edema bilaterally Skin: no lesions on visible extremities Neuro: No focal deficits.  Cranial nerves intact  ASSESSMENT:   1.  Functional dyspepsia secondary to anxiety/depression.  Continues to improve on Zoloft 2.  Anorexia with associated weight loss secondary to anxiety/depression.  As well, better on Zoloft  3.  Recent COVID infection     PLAN:   1.  Continue Zoloft 75 mg at night.  Prescription refilled. 2.  Continue to increase activity level and provide caloric supplement 3.  Routine office follow-up in 3 months.

## 2023-04-13 ENCOUNTER — Ambulatory Visit (INDEPENDENT_AMBULATORY_CARE_PROVIDER_SITE_OTHER): Payer: PPO

## 2023-04-13 DIAGNOSIS — E538 Deficiency of other specified B group vitamins: Secondary | ICD-10-CM

## 2023-04-13 MED ORDER — CYANOCOBALAMIN 1000 MCG/ML IJ SOLN
1000.0000 ug | Freq: Once | INTRAMUSCULAR | Status: AC
Start: 2023-04-13 — End: 2023-04-13
  Administered 2023-04-13: 1000 ug via INTRAMUSCULAR

## 2023-04-13 NOTE — Progress Notes (Signed)
Per orders of Dr.Fry , injection of B12  given by Stann Ore. Patient tolerated injection well.

## 2023-04-28 ENCOUNTER — Ambulatory Visit (INDEPENDENT_AMBULATORY_CARE_PROVIDER_SITE_OTHER): Payer: PPO

## 2023-04-28 DIAGNOSIS — E538 Deficiency of other specified B group vitamins: Secondary | ICD-10-CM | POA: Diagnosis not present

## 2023-04-28 MED ORDER — CYANOCOBALAMIN 1000 MCG/ML IJ SOLN
1000.0000 ug | Freq: Once | INTRAMUSCULAR | Status: AC
Start: 2023-04-28 — End: 2023-04-28
  Administered 2023-04-28: 1000 ug via INTRAMUSCULAR

## 2023-04-28 NOTE — Progress Notes (Signed)
Pt here for monthly B12 injection per Dr Clent Ridges  B12 given IM and pt tolerated injection well.  Next B12 injection scheduled for 05/11/23

## 2023-04-29 DIAGNOSIS — R052 Subacute cough: Secondary | ICD-10-CM | POA: Diagnosis not present

## 2023-04-29 DIAGNOSIS — J300 Vasomotor rhinitis: Secondary | ICD-10-CM | POA: Diagnosis not present

## 2023-04-29 DIAGNOSIS — J45991 Cough variant asthma: Secondary | ICD-10-CM | POA: Diagnosis not present

## 2023-05-11 ENCOUNTER — Ambulatory Visit (INDEPENDENT_AMBULATORY_CARE_PROVIDER_SITE_OTHER): Payer: PPO

## 2023-05-11 DIAGNOSIS — E538 Deficiency of other specified B group vitamins: Secondary | ICD-10-CM

## 2023-05-11 MED ORDER — CYANOCOBALAMIN 1000 MCG/ML IJ SOLN
1000.0000 ug | Freq: Once | INTRAMUSCULAR | Status: AC
Start: 2023-05-11 — End: 2023-05-11
  Administered 2023-05-11: 1000 ug via INTRAMUSCULAR

## 2023-05-11 NOTE — Progress Notes (Signed)
Per orders of Dr. Clent Ridges, injection of B12 given by Vickii Chafe on Right Deltoid. Patient tolerated injection well.

## 2023-05-25 ENCOUNTER — Ambulatory Visit (INDEPENDENT_AMBULATORY_CARE_PROVIDER_SITE_OTHER): Payer: PPO

## 2023-05-25 DIAGNOSIS — E538 Deficiency of other specified B group vitamins: Secondary | ICD-10-CM

## 2023-05-25 MED ORDER — CYANOCOBALAMIN 1000 MCG/ML IJ SOLN
1000.0000 ug | Freq: Once | INTRAMUSCULAR | Status: AC
Start: 2023-05-25 — End: 2023-05-25
  Administered 2023-05-25: 1000 ug via INTRAMUSCULAR

## 2023-05-25 NOTE — Progress Notes (Signed)
Per orders of Dr. Clent Ridges, injection of B12 given by Vickii Chafe. Patient tolerated injection well.

## 2023-06-03 ENCOUNTER — Other Ambulatory Visit: Payer: Self-pay | Admitting: Family Medicine

## 2023-06-04 NOTE — Telephone Encounter (Signed)
Pt LOV was 04/02/23

## 2023-06-07 ENCOUNTER — Other Ambulatory Visit: Payer: Self-pay | Admitting: Family Medicine

## 2023-06-08 ENCOUNTER — Ambulatory Visit (INDEPENDENT_AMBULATORY_CARE_PROVIDER_SITE_OTHER): Payer: PPO

## 2023-06-08 DIAGNOSIS — E538 Deficiency of other specified B group vitamins: Secondary | ICD-10-CM

## 2023-06-08 MED ORDER — CYANOCOBALAMIN 1000 MCG/ML IJ SOLN
1000.0000 ug | Freq: Once | INTRAMUSCULAR | Status: AC
  Administered 2023-06-08: 1000 ug via INTRAMUSCULAR

## 2023-06-08 NOTE — Progress Notes (Signed)
Per orders of Dr. Clent Ridges, injection of B12 given by Vickii Chafe on Right Deltoid. Patient tolerated injection well.

## 2023-06-12 ENCOUNTER — Other Ambulatory Visit: Payer: Self-pay | Admitting: Internal Medicine

## 2023-06-14 ENCOUNTER — Other Ambulatory Visit: Payer: Self-pay | Admitting: Family Medicine

## 2023-06-22 ENCOUNTER — Ambulatory Visit (INDEPENDENT_AMBULATORY_CARE_PROVIDER_SITE_OTHER): Payer: PPO

## 2023-06-22 DIAGNOSIS — E538 Deficiency of other specified B group vitamins: Secondary | ICD-10-CM

## 2023-06-22 MED ORDER — CYANOCOBALAMIN 1000 MCG/ML IJ SOLN
1000.0000 ug | Freq: Once | INTRAMUSCULAR | Status: AC
  Administered 2023-06-22: 1000 ug via INTRAMUSCULAR

## 2023-06-22 NOTE — Progress Notes (Signed)
Per orders of Dr. Clent Ridges, injection of B12 given by Vickii Chafe on Left Deltoid. Patient tolerated injection well.

## 2023-06-23 ENCOUNTER — Ambulatory Visit: Payer: PPO | Admitting: Internal Medicine

## 2023-06-23 ENCOUNTER — Encounter: Payer: Self-pay | Admitting: Internal Medicine

## 2023-06-23 VITALS — BP 128/66 | HR 65 | Ht 66.0 in | Wt 114.0 lb

## 2023-06-23 DIAGNOSIS — R630 Anorexia: Secondary | ICD-10-CM | POA: Diagnosis not present

## 2023-06-23 DIAGNOSIS — R634 Abnormal weight loss: Secondary | ICD-10-CM | POA: Diagnosis not present

## 2023-06-23 DIAGNOSIS — K30 Functional dyspepsia: Secondary | ICD-10-CM | POA: Diagnosis not present

## 2023-06-23 DIAGNOSIS — K219 Gastro-esophageal reflux disease without esophagitis: Secondary | ICD-10-CM

## 2023-06-23 DIAGNOSIS — K589 Irritable bowel syndrome without diarrhea: Secondary | ICD-10-CM

## 2023-06-23 DIAGNOSIS — R1013 Epigastric pain: Secondary | ICD-10-CM

## 2023-06-23 MED ORDER — SERTRALINE HCL 50 MG PO TABS: 100.0000 mg | ORAL_TABLET | Freq: Every day | ORAL | 6 refills | Status: DC

## 2023-06-23 NOTE — Progress Notes (Signed)
HISTORY OF PRESENT ILLNESS:  [name] is a 77 y.o. 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  These were all felt secondary to anxiety with depression.  She was last seen in the office April 08, 2023.Marland Kitchen  At that time symptoms were improved on Zoloft 75 mg at night.  She presents today for routine follow-up.   Overall she has been doing well.  Ongoing improvements in strength, appetite, and attitude.  She has gained weight.  She feels that she is recovering slowly from COVID.  Describes low energy levels in the morning.  Has hip and leg aches.  Has some dyspeptic complaints for which she takes Pepcid at night.  Wondering about alternatives.  Also wondering if she might increase her Zoloft, which she is tolerating, regarding issues with energy levels and interest.  She states that she is not as excited about a Christmas tree this year.  Otherwise, no new problems  REVIEW OF SYSTEMS:  All non-GI ROS negative.  Past Medical History:  Diagnosis Date   Acute upper respiratory infections of unspecified site    Allergy    ANXIETY 10/12/2008   Arthritis    Cancer (HCC)    breast- right   COPD 03/26/2009   pt denies this   COVID    GERD (gastroesophageal reflux disease)    HERPES ZOSTER 10/12/2008   Hyperlipidemia    Hypertension    Lumbago    Palpitations    Syncopal episodes 02/2009   "presyncopal" prbly vasovagal-MI ruled out; EKG normal; telemetry with PVC's/PAC's. Stress echo-no evidence for ischemia. EF 65-70%. No valvular abnormalities, no pulm. HTN   Urinary tract infection, site not specified     Past Surgical History:  Procedure Laterality Date   BREAST SURGERY     2004- pt denies lymph node removal   COLONOSCOPY  04/20/2003   per Dr. Marina Goodell, clear. Cologuard 01-24-19 negative (repeat in 3 yrs)    OOPHORECTOMY     ROTATOR CUFF REPAIR Left  2014    Social History [name]  reports that she has quit smoking. She has never used smokeless tobacco. She reports current alcohol use of about 7.0 - 14.0 standard drinks of alcohol per week. She reports that she does not use drugs.  family history includes Alzheimer's disease in her mother; Dementia in her mother; Diabetes in her brother; Parkinsonism in her father.  Allergies  Allergen Reactions   Cefdinir    Codeine Phosphate    Ephedrine Hcl    Lipitor [Atorvastatin]     Muscle cramps    Other     Phedral   Penicillins     REACTION: per allergy test       PHYSICAL EXAMINATION: Vital signs: BP 128/66   Pulse 65   Ht 5\' 6"  (1.676 m)   Wt 114 lb (51.7 kg)   BMI 18.40 kg/m   Constitutional: Thin but generally well-appearing, no acute distress Psychiatric: alert and oriented x3, cooperative Eyes: extraocular movements intact, anicteric, conjunctiva pink Mouth: oral pharynx moist, no lesions Neck: supple no lymphadenopathy Cardiovascular: heart regular rate and rhythm, no murmur Lungs: clear to auscultation bilaterally Abdomen: soft, nontender, nondistended, no obvious ascites, no peritoneal signs, normal bowel sounds, no organomegaly Rectal: Omitted Extremities: no clubbing, cyanosis, or lower extremity edema bilaterally Skin: no lesions on visible extremities Neuro: No focal deficits.  Cranial nerves intact  ASSESSMENT:  1.  Functional dyspepsia secondary to anxiety/depression.  Has improved on Zoloft.  Somewhat plateaued.  She is still hoping for improvement in attitude and strength 2.  Anorexia with associated weight loss secondary to anxiety/depression.  Continues to gain weight.  2 pounds since last visit 3.  Previous COVID infection with residual effects 4.  Vague dyspeptic complaints   PLAN:   1.  Increase Zoloft to 100 mg at night.  Prescription rewritten with refills.  Medication risks reviewed 2.  Continue to increase activity level and provide  caloric supplement 3.  Reassurance 4.  Recommend omeprazole 20 mg daily for dyspepsia complaints 5.  Routine office follow-up in 3 months. A total time of 30 minutes was spent preparing to see the patient, obtaining interval history, performing medically appropriate physical exam, counseling and educating the patient regarding the above listed issues, ordering medication, arranging follow-up, and documenting clinical information in the health record

## 2023-06-23 NOTE — Patient Instructions (Addendum)
We have sent the following medications to your pharmacy for you to pick up at your convenience:  Zoloft  Please follow up in 3 months  _______________________________________________________  If your blood pressure at your visit was 140/90 or greater, please contact your primary care physician to follow up on this.  _______________________________________________________  If you are age 77 or older, your body mass index should be between 23-30. Your Body mass index is 18.4 kg/m. If this is out of the aforementioned range listed, please consider follow up with your Primary Care Provider.  If you are age 67 or younger, your body mass index should be between 19-25. Your Body mass index is 18.4 kg/m. If this is out of the aformentioned range listed, please consider follow up with your Primary Care Provider.   ________________________________________________________  The [place] [facility] providers would like to encourage you to use [facility] to communicate with providers for non-urgent requests or questions.  Due to long hold times on the telephone, sending your provider a message by [facility] may be a faster and more efficient way to get a response.  Please allow 48 business hours for a response.  Please remember that this is for non-urgent requests.  _______________________________________________________

## 2023-07-07 ENCOUNTER — Ambulatory Visit (INDEPENDENT_AMBULATORY_CARE_PROVIDER_SITE_OTHER): Payer: PPO

## 2023-07-07 DIAGNOSIS — E538 Deficiency of other specified B group vitamins: Secondary | ICD-10-CM

## 2023-07-07 MED ORDER — CYANOCOBALAMIN 1000 MCG/ML IJ SOLN
1000.0000 ug | Freq: Once | INTRAMUSCULAR | Status: AC
  Administered 2023-07-07: 1000 ug via INTRAMUSCULAR

## 2023-07-07 NOTE — Progress Notes (Signed)
Pt here for monthly B12 injection per Dr Clent Ridges  B12 given IM and pt tolerated injection well.  Next B12 injection scheduled for 07/20/23

## 2023-07-20 ENCOUNTER — Ambulatory Visit (INDEPENDENT_AMBULATORY_CARE_PROVIDER_SITE_OTHER): Payer: PPO

## 2023-07-20 ENCOUNTER — Telehealth: Payer: Self-pay

## 2023-07-20 DIAGNOSIS — Z23 Encounter for immunization: Secondary | ICD-10-CM

## 2023-07-20 DIAGNOSIS — E538 Deficiency of other specified B group vitamins: Secondary | ICD-10-CM

## 2023-07-20 MED ORDER — CYANOCOBALAMIN 1000 MCG/ML IJ SOLN
1000.0000 ug | Freq: Once | INTRAMUSCULAR | Status: AC
  Administered 2023-07-20: 1000 ug via INTRAMUSCULAR

## 2023-07-20 NOTE — Progress Notes (Signed)
Per orders of Dr. Clent Ridges, injection of B12 given by Vickii Chafe on Left Deltoid. Patient tolerated injection well.

## 2023-07-20 NOTE — Telephone Encounter (Signed)
Patient came in for B12 injection. Pt would like to know when provider would like for her to come back for B12 level check? Will it be prior to her physical exam or at her physical exam appt ?  Please advise.

## 2023-07-20 NOTE — Telephone Encounter (Signed)
Let's go ahead and check it now (for a 6 month follow up). I put in the order

## 2023-07-20 NOTE — Telephone Encounter (Signed)
Pt notified appointment for lab scheduled for 08/03/23

## 2023-07-27 ENCOUNTER — Telehealth: Payer: Self-pay | Admitting: Internal Medicine

## 2023-07-27 NOTE — Telephone Encounter (Signed)
 Inbound call from patient, requesting a sooner appoitnment with Dr. Marina Goodell. She is currently scheduled for March, but states she would like to speak with a nurse and get "squeezed in" because her symptoms are not improving.   Thank you.

## 2023-07-27 NOTE — Telephone Encounter (Signed)
 Pt states she has been having an issue with heartburn. She stopped the prilosec she had been taking and went back on famotidine 20mg  bid. She states is is not working and she reports heartburn symptoms. Pt scheduled to see Dr. Abran 08/12/23 at 4pm. Pt did not want the March appt cancelled. Pt aware of appt.

## 2023-08-03 ENCOUNTER — Other Ambulatory Visit (INDEPENDENT_AMBULATORY_CARE_PROVIDER_SITE_OTHER): Payer: PPO

## 2023-08-03 ENCOUNTER — Ambulatory Visit: Payer: PPO

## 2023-08-03 DIAGNOSIS — E538 Deficiency of other specified B group vitamins: Secondary | ICD-10-CM | POA: Diagnosis not present

## 2023-08-03 LAB — VITAMIN B12: Vitamin B-12: 628 pg/mL (ref 211–911)

## 2023-08-03 MED ORDER — CYANOCOBALAMIN 1000 MCG/ML IJ SOLN
1000.0000 ug | Freq: Once | INTRAMUSCULAR | Status: AC
  Administered 2023-08-03: 1000 ug via INTRAMUSCULAR

## 2023-08-03 NOTE — Progress Notes (Signed)
Per orders of Dr. Clent Ridges, injection of B12 given by Vickii Chafe on Right Deltoid. Patient tolerated injection well.

## 2023-08-12 ENCOUNTER — Ambulatory Visit: Payer: PPO | Admitting: Internal Medicine

## 2023-08-12 ENCOUNTER — Encounter: Payer: Self-pay | Admitting: Internal Medicine

## 2023-08-12 VITALS — BP 126/70 | HR 59 | Ht 66.0 in | Wt 114.0 lb

## 2023-08-12 DIAGNOSIS — F419 Anxiety disorder, unspecified: Secondary | ICD-10-CM | POA: Diagnosis not present

## 2023-08-12 DIAGNOSIS — R1013 Epigastric pain: Secondary | ICD-10-CM

## 2023-08-12 DIAGNOSIS — F32A Depression, unspecified: Secondary | ICD-10-CM

## 2023-08-12 DIAGNOSIS — K219 Gastro-esophageal reflux disease without esophagitis: Secondary | ICD-10-CM

## 2023-08-12 DIAGNOSIS — R0789 Other chest pain: Secondary | ICD-10-CM

## 2023-08-12 MED ORDER — PANTOPRAZOLE SODIUM 40 MG PO TBEC: 40.0000 mg | DELAYED_RELEASE_TABLET | Freq: Every day | ORAL | 3 refills | Status: DC

## 2023-08-12 NOTE — Progress Notes (Signed)
HISTORY OF PRESENT ILLNESS:  [name] is a 78 y.o. female with GI and non-GI functional complaints related to anxiety and depression.  She has been followed here for this issue.  Last evaluated June 23, 2023.  See that dictation.  Her Zoloft was increased to 100 mg at night.  She was told to try omeprazole for dyspeptic complaints (she wanted to).  Follow-up in 3 months.  Patient contacted the office requesting sooner visit.  This appointment given.  She tells me that she has had vague chest discomfort that does not particularly respond to acid suppressive therapies.  It does respond to Xanax.  She tells me that she is having no abdominal complaints or sensation of dysphagia.  She is wanting to try pantoprazole as she believes this helped her in the past.  She is also wondering if she can decrease her Zoloft.  She describes anhedonia.  We discussed possibility of seeing a psychiatrist.  She states that this was suggested by her PCP previously.  I agree.  REVIEW OF SYSTEMS:  All non-GI ROS negative. Past Medical History:  Diagnosis Date   Acute upper respiratory infections of unspecified site    Allergy    ANXIETY 10/12/2008   Arthritis    Cancer (HCC)    breast- right   COPD 03/26/2009   pt denies this   COVID    GERD (gastroesophageal reflux disease)    HERPES ZOSTER 10/12/2008   Hyperlipidemia    Hypertension    Lumbago    Palpitations    Syncopal episodes 02/2009   "presyncopal" prbly vasovagal-MI ruled out; EKG normal; telemetry with PVC's/PAC's. Stress echo-no evidence for ischemia. EF 65-70%. No valvular abnormalities, no pulm. HTN   Urinary tract infection, site not specified     Past Surgical History:  Procedure Laterality Date   BREAST SURGERY     2004- pt denies lymph node removal   COLONOSCOPY  04/20/2003   per Dr. Marina Goodell, clear. Cologuard 01-24-19 negative (repeat in 3 yrs)    OOPHORECTOMY     ROTATOR CUFF REPAIR Left 2014    Social History MELVIN PRIMEAUX   reports that she has quit smoking. She has never used smokeless tobacco. She reports current alcohol use of about 7.0 - 14.0 standard drinks of alcohol per week. She reports that she does not use drugs.  family history includes Alzheimer's disease in her mother; Dementia in her mother; Diabetes in her brother; Parkinsonism in her father.  Allergies  Allergen Reactions   Cefdinir    Codeine Phosphate    Ephedrine Hcl    Lipitor [Atorvastatin]     Muscle cramps    Other     Phedral   Penicillins     REACTION: per allergy test       PHYSICAL EXAMINATION: Vital signs: BP 126/70   Pulse (!) 59   Ht 5\' 6"  (1.676 m)   Wt 114 lb (51.7 kg)   BMI 18.40 kg/m   Constitutional: Pleasant, thin, generally well-appearing, no acute distress Psychiatric: alert and oriented x3, cooperative Eyes: extraocular movements intact, anicteric, conjunctiva pink Mouth: oral pharynx moist, no lesions Neck: supple no lymphadenopathy Cardiovascular: heart regular rate and rhythm, no murmur Lungs: clear to auscultation bilaterally Abdomen: soft, nontender, nondistended, no obvious ascites, no peritoneal signs, normal bowel sounds, no organomegaly Rectal: Omitted Extremities: no clubbing, cyanosis, or lower extremity edema bilaterally Skin: no lesions on visible extremities Neuro: No focal deficits..  ASSESSMENT:  1.  Depression with anxiety.  Ongoing.  Highest 2.  Complaints of chest discomfort.  Functional. 3.  Dyspeptic complaints.  Improved.   PLAN:  1.  Decrease Zoloft to 75 mg daily 2.  Prescribe pantoprazole 40 mg daily 3.  Reach out to Dr. Clent Ridges regarding referral to behavioral health.  He had recommended this previously.  Patient is open to the idea. 4.  Acute GI follow-up with me on March 6 as scheduled. Total time of 30 minutes were spent preparing to see the patient, obtaining interval history, performing medically appropriate physical examination, counseling and educating the patient  regarding the above listed issues, ordering medication, adjusting medication, arranging follow-up, communicating with her PCP, and documenting clinical information in the health record

## 2023-08-12 NOTE — Patient Instructions (Signed)
We have sent the following medications to your pharmacy for you to pick up at your convenience:  Pantoprazole.  Decrease your Zoloft to 75mg   _______________________________________________________  If your blood pressure at your visit was 140/90 or greater, please contact your primary care physician to follow up on this.  _______________________________________________________  If you are age 78 or older, your body mass index should be between 23-30. Your Body mass index is 18.4 kg/m. If this is out of the aforementioned range listed, please consider follow up with your Primary Care Provider.  If you are age 29 or younger, your body mass index should be between 19-25. Your Body mass index is 18.4 kg/m. If this is out of the aformentioned range listed, please consider follow up with your Primary Care Provider.   ________________________________________________________  The [place] [facility] providers would like to encourage you to use [facility] to communicate with providers for non-urgent requests or questions.  Due to long hold times on the telephone, sending your provider a message by [facility] may be a faster and more efficient way to get a response.  Please allow 48 business hours for a response.  Please remember that this is for non-urgent requests.  _______________________________________________________

## 2023-08-17 ENCOUNTER — Ambulatory Visit (INDEPENDENT_AMBULATORY_CARE_PROVIDER_SITE_OTHER): Payer: PPO

## 2023-08-17 DIAGNOSIS — E538 Deficiency of other specified B group vitamins: Secondary | ICD-10-CM | POA: Diagnosis not present

## 2023-08-17 MED ORDER — CYANOCOBALAMIN 1000 MCG/ML IJ SOLN
1000.0000 ug | Freq: Once | INTRAMUSCULAR | Status: AC
  Administered 2023-08-17: 1000 ug via INTRAMUSCULAR

## 2023-08-17 NOTE — Progress Notes (Signed)
Per orders of Dr. Clent Ridges, injection of B12 given by Vickii Chafe on Left Deltoid. Patient tolerated injection well.

## 2023-08-31 ENCOUNTER — Ambulatory Visit (INDEPENDENT_AMBULATORY_CARE_PROVIDER_SITE_OTHER): Payer: PPO

## 2023-08-31 DIAGNOSIS — E538 Deficiency of other specified B group vitamins: Secondary | ICD-10-CM | POA: Diagnosis not present

## 2023-08-31 MED ORDER — CYANOCOBALAMIN 1000 MCG/ML IJ SOLN
1000.0000 ug | Freq: Once | INTRAMUSCULAR | Status: AC
  Administered 2023-08-31: 1000 ug via INTRAMUSCULAR

## 2023-08-31 NOTE — Progress Notes (Signed)
 Per orders of Dr. Clent Ridges, injection of B12 given by Vickii Chafe on Left Deltoid. Patient tolerated injection well.

## 2023-09-09 ENCOUNTER — Other Ambulatory Visit: Payer: Self-pay | Admitting: Family Medicine

## 2023-09-15 ENCOUNTER — Ambulatory Visit (INDEPENDENT_AMBULATORY_CARE_PROVIDER_SITE_OTHER): Payer: PPO

## 2023-09-15 DIAGNOSIS — E538 Deficiency of other specified B group vitamins: Secondary | ICD-10-CM

## 2023-09-15 MED ORDER — CYANOCOBALAMIN 1000 MCG/ML IJ SOLN
1000.0000 ug | Freq: Once | INTRAMUSCULAR | Status: AC
Start: 2023-09-15 — End: 2023-09-15
  Administered 2023-09-15: 1000 ug via INTRAMUSCULAR

## 2023-09-15 NOTE — Progress Notes (Signed)
 Pt here for monthly B12 injection per Dr Clent Ridges   B12 given IM and pt tolerated injection well.  Next B12 injection scheduled for 09/28/23

## 2023-09-24 ENCOUNTER — Ambulatory Visit: Payer: PPO | Admitting: Internal Medicine

## 2023-09-28 ENCOUNTER — Ambulatory Visit (INDEPENDENT_AMBULATORY_CARE_PROVIDER_SITE_OTHER): Payer: PPO

## 2023-09-28 DIAGNOSIS — E538 Deficiency of other specified B group vitamins: Secondary | ICD-10-CM

## 2023-09-28 MED ORDER — CYANOCOBALAMIN 1000 MCG/ML IJ SOLN
1000.0000 ug | Freq: Once | INTRAMUSCULAR | Status: AC
  Administered 2023-09-28: 1000 ug via INTRAMUSCULAR

## 2023-09-28 NOTE — Progress Notes (Signed)
 Per orders of Dr. Clent Ridges, injection of Cyanocobalamin 1000 mcg given by Vickii Chafe on Right Deltoid. Patient tolerated injection well.

## 2023-10-12 ENCOUNTER — Ambulatory Visit (INDEPENDENT_AMBULATORY_CARE_PROVIDER_SITE_OTHER)

## 2023-10-12 DIAGNOSIS — E538 Deficiency of other specified B group vitamins: Secondary | ICD-10-CM | POA: Diagnosis not present

## 2023-10-12 MED ORDER — CYANOCOBALAMIN 1000 MCG/ML IJ SOLN
1000.0000 ug | Freq: Once | INTRAMUSCULAR | Status: AC
  Administered 2023-10-12: 1000 ug via INTRAMUSCULAR

## 2023-10-12 NOTE — Progress Notes (Signed)
 Per orders of Dr. Clent Ridges, injection of Cyanocobalamin inj. 1000 mcg given by Vickii Chafe on Left Deltoid. Patient tolerated injection well.

## 2023-10-26 ENCOUNTER — Ambulatory Visit (INDEPENDENT_AMBULATORY_CARE_PROVIDER_SITE_OTHER)

## 2023-10-26 DIAGNOSIS — E538 Deficiency of other specified B group vitamins: Secondary | ICD-10-CM

## 2023-10-26 MED ORDER — CYANOCOBALAMIN 1000 MCG/ML IJ SOLN
1000.0000 ug | Freq: Once | INTRAMUSCULAR | Status: AC
  Administered 2023-10-26: 1000 ug via INTRAMUSCULAR

## 2023-10-26 NOTE — Progress Notes (Signed)
 Patient is in office today for a nurse visit for B12 Injection. Patient Injection was given in the  Right deltoid. Patient tolerated injection well.

## 2023-11-09 ENCOUNTER — Ambulatory Visit (INDEPENDENT_AMBULATORY_CARE_PROVIDER_SITE_OTHER)

## 2023-11-09 DIAGNOSIS — E538 Deficiency of other specified B group vitamins: Secondary | ICD-10-CM

## 2023-11-09 MED ORDER — CYANOCOBALAMIN 1000 MCG/ML IJ SOLN
1000.0000 ug | Freq: Once | INTRAMUSCULAR | Status: AC
  Administered 2023-11-09: 1000 ug via INTRAMUSCULAR

## 2023-11-09 NOTE — Progress Notes (Signed)
 Patient is in office today for a nurse visit for B12 Injection. Patient Injection was given in the  Left deltoid. Patient tolerated injection well.

## 2023-11-12 DIAGNOSIS — J45991 Cough variant asthma: Secondary | ICD-10-CM | POA: Diagnosis not present

## 2023-11-12 DIAGNOSIS — J300 Vasomotor rhinitis: Secondary | ICD-10-CM | POA: Diagnosis not present

## 2023-11-12 DIAGNOSIS — R052 Subacute cough: Secondary | ICD-10-CM | POA: Diagnosis not present

## 2023-11-17 DIAGNOSIS — J300 Vasomotor rhinitis: Secondary | ICD-10-CM | POA: Diagnosis not present

## 2023-11-17 DIAGNOSIS — J45991 Cough variant asthma: Secondary | ICD-10-CM | POA: Diagnosis not present

## 2023-11-17 DIAGNOSIS — J209 Acute bronchitis, unspecified: Secondary | ICD-10-CM | POA: Diagnosis not present

## 2023-11-19 ENCOUNTER — Telehealth: Payer: Self-pay | Admitting: *Deleted

## 2023-11-19 ENCOUNTER — Ambulatory Visit: Payer: Self-pay | Admitting: *Deleted

## 2023-11-19 NOTE — Telephone Encounter (Signed)
 FYI Spoke with pt scheduled appointment with Dr Darren Em on 11/20/23.PC has no availability

## 2023-11-19 NOTE — Telephone Encounter (Signed)
  Chief Complaint: left rib pain with chest pain and breathing difficulty at times. Continues taking z pack . Requesting chest xray r/o pneumonia  Symptoms: not getting better with z pack given from allergy dr. See sx above  Frequency: on going since 11/17/23 Pertinent Negatives: Patient denies severe chest pain no difficulty breathing with talking , no fever reported.  Disposition: [] ED /[] Urgent Care (no appt availability in office) / [] Appointment(In office/virtual)/ []  [place] [facility]/ [] Home Care/ [x] Refused Recommended Disposition /[] [facility]/ []  Follow-up with PCP Additional Notes:   Patient requesting to see PCP today . Called [facility] and no available appt until tomorrow with any provider. Recommended ED and patient wants call back from [facility] today please .   [facility] notified patient request and does not want to go to ED.    Copied from CRM [id number]. Topic: Clinical - Red Word Triage >> Nov 19, 2023 11:15 AM Dewanda Foots wrote: Red Word that prompted transfer to Nurse Triage: Pt states she is having pain in her left rib cage and is concerned she may have pneumonia. States the allergy specialist put her on a z-pack Tuesday 11/17/23 and she is doing better but still has tightness in her chest and trouble breathing but not as bad as before. More concerned with the pain in her ribs. Wanted to schedule an appt but due to symptoms, triage is required. Reason for Disposition  Patient sounds very sick or weak to the triager  Answer Assessment - Initial Assessment Questions 1. LOCATION: "Where does it hurt?"       Left rib cage pain , chest pain 2. RADIATION: "Does the pain go anywhere else?" (e.g., into neck, jaw, arms, back)     Na  3. ONSET: "When did the chest pain begin?" (Minutes, hours or days)      On going since 11/17/23 4. PATTERN: "Does the pain come and go, or has it been constant since it started?"  "Does it get worse with exertion?"      Na  5. DURATION: "How long  does it last" (e.g., seconds, minutes, hours)     Na  6. SEVERITY: "How bad is the pain?"  (e.g., Scale 1-10; mild, moderate, or severe)    - MILD (1-3): doesn't interfere with normal activities     - MODERATE (4-7): interferes with normal activities or awakens from sleep    - SEVERE (8-10): excruciating pain, unable to do any normal activities       Worsening at times left rib cage pain  7. CARDIAC RISK FACTORS: "Do you have any history of heart problems or risk factors for heart disease?" (e.g., angina, prior heart attack; diabetes, high blood pressure, high cholesterol, smoker, or strong family history of heart disease)     See hx  8. PULMONARY RISK FACTORS: "Do you have any history of lung disease?"  (e.g., blood clots in lung, asthma, emphysema, birth control pills)     See hx pneumonia  9. CAUSE: "What do you think is causing the chest pain?"     Recent sickness 10. OTHER SYMPTOMS: "Do you have any other symptoms?" (e.g., dizziness, nausea, vomiting, sweating, fever, difficulty breathing, cough)       Nausea from z pack , chest pain left rib cage area, difficulty breathing at times.  11. PREGNANCY: "Is there any chance you are pregnant?" "When was your last menstrual period?"       na  Protocols used: Chest Pain-A-AH

## 2023-11-19 NOTE — Telephone Encounter (Signed)
 Copied from CRM [id number]. Topic: General - Other >> Nov 19, 2023  1:38 PM Albertha Alosa wrote: Reason for CRM: Patient called in to nurse to call back at [phone]

## 2023-11-20 ENCOUNTER — Ambulatory Visit (INDEPENDENT_AMBULATORY_CARE_PROVIDER_SITE_OTHER): Admitting: Family Medicine

## 2023-11-20 ENCOUNTER — Ambulatory Visit: Admitting: Family Medicine

## 2023-11-20 ENCOUNTER — Encounter: Payer: Self-pay | Admitting: Family Medicine

## 2023-11-20 ENCOUNTER — Ambulatory Visit (INDEPENDENT_AMBULATORY_CARE_PROVIDER_SITE_OTHER)

## 2023-11-20 VITALS — BP 120/68 | HR 83 | Temp 98.3°F | Wt 114.8 lb

## 2023-11-20 DIAGNOSIS — R059 Cough, unspecified: Secondary | ICD-10-CM

## 2023-11-20 DIAGNOSIS — R918 Other nonspecific abnormal finding of lung field: Secondary | ICD-10-CM | POA: Diagnosis not present

## 2023-11-20 DIAGNOSIS — R0600 Dyspnea, unspecified: Secondary | ICD-10-CM | POA: Diagnosis not present

## 2023-11-20 DIAGNOSIS — J984 Other disorders of lung: Secondary | ICD-10-CM | POA: Diagnosis not present

## 2023-11-20 NOTE — Progress Notes (Unsigned)
 Established Patient Office Visit  Subjective   Patient ID: [name], female    DOB: 04-05-1946  Age: 78 y.o. MRN: [id number]  Chief Complaint  Patient presents with   Rib pain    Patient complains of left sided rib pain, x2 days    Shortness of Breath    Patient complains of shortness of breath, x2 days     HPI  {History (Optional):23778} [name] is seen today as a work and with some left lower anterior rib cage pain and some cough and shortness of breath.  She states that she developed some cough over a week ago along with some increased nasal congestion.  She saw her allergist April 24 and received Depo-Medrol  injection.  Had ongoing symptoms and was seen by them this past Tuesday and given nebulizer treatment, started on Zithromax, and given albuterol MDI.  She denies any fever.  Her concern is that she has had pneumonia previously and wanted to be careful to rule out out.  Has had some mild dyspnea with exertion.  No chest pains.  O2 sat 99% room air.  No nausea or vomiting.  No chronic lung disease.  Past Medical History:  Diagnosis Date   Acute upper respiratory infections of unspecified site    Allergy    ANXIETY 10/12/2008   Arthritis    Cancer (HCC)    breast- right   COPD 03/26/2009   pt denies this   COVID    GERD (gastroesophageal reflux disease)    HERPES ZOSTER 10/12/2008   Hyperlipidemia    Hypertension    Lumbago    Palpitations    Syncopal episodes 02/2009   "presyncopal" prbly vasovagal-MI ruled out; EKG normal; telemetry with PVC's/PAC's. Stress echo-no evidence for ischemia. EF 65-70%. No valvular abnormalities, no pulm. HTN   Urinary tract infection, site not specified    Past Surgical History:  Procedure Laterality Date   BREAST SURGERY     2004- pt denies lymph node removal   COLONOSCOPY  04/20/2003   per Dr. Elvin Hammer, clear. Cologuard 01-24-19 negative (repeat in 3 yrs)    OOPHORECTOMY     ROTATOR CUFF REPAIR Left 2014    reports that she  has quit smoking. She has never used smokeless tobacco. She reports current alcohol use of about 7.0 - 14.0 standard drinks of alcohol per week. She reports that she does not use drugs. family history includes Alzheimer's disease in her mother; Dementia in her mother; Diabetes in her brother; Parkinsonism in her father. Allergies  Allergen Reactions   Cefdinir    Codeine Phosphate    Ephedrine Hcl    Lipitor [Atorvastatin ]     Muscle cramps    Other     Phedral   Penicillins     REACTION: per allergy test    Review of Systems  Constitutional:  Negative for chills and fever.  Respiratory:  Positive for cough and shortness of breath. Negative for hemoptysis and wheezing.   Cardiovascular:  Negative for chest pain.      Objective:     BP 120/68 (BP Location: Left Arm, Patient Position: Sitting, Cuff Size: Normal)   Pulse 83   Temp 98.3 F (36.8 C) (Oral)   Wt 114 lb 12.8 oz (52.1 kg)   SpO2 99%   BMI 18.53 kg/m  BP Readings from Last 3 Encounters:  11/20/23 120/68  08/12/23 126/70  06/23/23 128/66   Wt Readings from Last 3 Encounters:  11/20/23 114 lb 12.8  oz (52.1 kg)  08/12/23 114 lb (51.7 kg)  06/23/23 114 lb (51.7 kg)      Physical Exam Vitals reviewed.  Constitutional:      General: She is not in acute distress.    Appearance: She is not ill-appearing.  Cardiovascular:     Rate and Rhythm: Normal rate and regular rhythm.  Pulmonary:     Effort: Pulmonary effort is normal.     Breath sounds: Normal breath sounds. No wheezing or rales.  Musculoskeletal:     Right lower leg: No edema.     Left lower leg: No edema.  Neurological:     Mental Status: She is alert.      No results found for any visits on 11/20/23.  {Labs (Optional):23779}  The 10-year ASCVD risk score (Arnett DK, et al., 2019) is: 26.8%    Assessment & Plan:   Problem List Items Addressed This Visit   None Visit Diagnoses       Cough, unspecified type    -  Primary   Relevant  Orders   DG Chest 2 View     Patient presents with persistent cough and some mild dyspnea.  No respiratory distress.  Lung exam nonfocal.  O2 sats 99% room air.  She is currently on Zithromax.  Her concern is pneumonia.  She does not have any red flags such as fever, rales, etc.  -Agreed to PA and lateral chest x-ray to further assess  No follow-ups on file.    Glean Lamy, MD

## 2023-11-20 NOTE — Patient Instructions (Signed)
 Follow up for any fever or increased shortness of breath.   We will be in touch after X-ray over read.

## 2023-11-20 NOTE — Telephone Encounter (Signed)
 Pt was seen today by Dr Darren Em for this problem

## 2023-11-23 ENCOUNTER — Encounter: Payer: Self-pay | Admitting: Family Medicine

## 2023-11-23 ENCOUNTER — Ambulatory Visit (INDEPENDENT_AMBULATORY_CARE_PROVIDER_SITE_OTHER): Admitting: Family Medicine

## 2023-11-23 VITALS — BP 134/70 | HR 63 | Temp 98.2°F | Wt 116.2 lb

## 2023-11-23 DIAGNOSIS — E538 Deficiency of other specified B group vitamins: Secondary | ICD-10-CM | POA: Diagnosis not present

## 2023-11-23 DIAGNOSIS — B029 Zoster without complications: Secondary | ICD-10-CM

## 2023-11-23 MED ORDER — CYANOCOBALAMIN 1000 MCG/ML IJ SOLN
1000.0000 ug | Freq: Once | INTRAMUSCULAR | Status: AC
Start: 2023-11-23 — End: 2023-11-23
  Administered 2023-11-23: 1000 ug via INTRAMUSCULAR

## 2023-11-23 MED ORDER — VALACYCLOVIR HCL 1 G PO TABS: 1000.0000 mg | ORAL_TABLET | Freq: Three times a day (TID) | ORAL | 0 refills | Status: DC

## 2023-11-23 MED ORDER — METHYLPREDNISOLONE 4 MG PO TBPK: ORAL_TABLET | ORAL | 0 refills | Status: DC

## 2023-11-23 NOTE — Addendum Note (Signed)
 Addended by: Zyad Boomer on: 11/23/2023 02:38 PM   Modules accepted: Orders

## 2023-11-23 NOTE — Progress Notes (Signed)
   Subjective:    Patient ID: [name], female    DOB: [birth year], 78 y.o.   MRN: [id number]  HPI Here for another bout of shingles. Three days ago she noticed a red rash on her left side, and this became quite painful. Now the rash has spread around to the left side of her trunk.    Review of Systems  Constitutional: Negative.   Respiratory: Negative.    Cardiovascular: Negative.   Skin:  Positive for rash.       Objective:   Physical Exam Cardiovascular:     Rate and Rhythm: Normal rate and regular rhythm.     Pulses: Normal pulses.     Heart sounds: Normal heart sounds.  Pulmonary:     Effort: Pulmonary effort is normal.     Breath sounds: Normal breath sounds.  Skin:    Comments: There is a band of red macules and papules on the left trunk from front to back  Neurological:     Mental Status: She is alert.           Assessment & Plan:  Shingles. Treat with 10 days of Valtrex and a Medrol  dose pack. Corita Diego, MD

## 2023-11-24 ENCOUNTER — Ambulatory Visit

## 2023-12-07 ENCOUNTER — Other Ambulatory Visit: Payer: Self-pay | Admitting: Family Medicine

## 2023-12-08 ENCOUNTER — Encounter: Payer: Self-pay | Admitting: Family Medicine

## 2023-12-08 ENCOUNTER — Ambulatory Visit (INDEPENDENT_AMBULATORY_CARE_PROVIDER_SITE_OTHER): Admitting: Family Medicine

## 2023-12-08 VITALS — BP 118/70 | HR 68 | Temp 98.1°F | Wt 114.0 lb

## 2023-12-08 DIAGNOSIS — E538 Deficiency of other specified B group vitamins: Secondary | ICD-10-CM | POA: Diagnosis not present

## 2023-12-08 DIAGNOSIS — B029 Zoster without complications: Secondary | ICD-10-CM | POA: Diagnosis not present

## 2023-12-08 MED ORDER — ZOLPIDEM TARTRATE 5 MG PO TABS: 5.0000 mg | ORAL_TABLET | Freq: Every day | ORAL | 1 refills | Status: DC

## 2023-12-08 MED ORDER — CYANOCOBALAMIN 1000 MCG/ML IJ SOLN
1000.0000 ug | Freq: Once | INTRAMUSCULAR | Status: AC
  Administered 2023-12-08: 1000 ug via INTRAMUSCULAR

## 2023-12-08 NOTE — Progress Notes (Signed)
   Subjective:    Patient ID: [name], female    DOB: May 10, 1946, 78 y.o.   MRN: [id number]  HPI Here to follow on shingles on the left trunk. We saw her on 11-23-23 for the appearance of a red rash on the left trunk that was quite painful. She was given Valtrex  and a Medrol  dose pack, and she feels better now. The pain is less intense and she is able to wear a shirt or blouse now.    Review of Systems  Constitutional: Negative.   Respiratory: Negative.    Cardiovascular: Negative.   Skin:  Positive for rash.       Objective:   Physical Exam Constitutional:      Appearance: Normal appearance.  Cardiovascular:     Rate and Rhythm: Normal rate and regular rhythm.     Pulses: Normal pulses.     Heart sounds: Normal heart sounds.  Pulmonary:     Effort: Pulmonary effort is normal.     Breath sounds: Normal breath sounds.  Skin:    Comments: There is a band of macular red spots on the left trunk from under the left breast around to the left middle back  Neurological:     Mental Status: She is alert.           Assessment & Plan:  She is recovering from shingles, and she seems to be doing as well as expected. The pain and the rash should resolve in the next few weeks. She will follow up as needed. Corita Diego, MD

## 2023-12-09 ENCOUNTER — Encounter: Payer: Self-pay | Admitting: Internal Medicine

## 2023-12-09 ENCOUNTER — Ambulatory Visit: Admitting: Internal Medicine

## 2023-12-09 VITALS — BP 104/62 | HR 79 | Ht 66.0 in | Wt 113.0 lb

## 2023-12-09 DIAGNOSIS — F419 Anxiety disorder, unspecified: Secondary | ICD-10-CM

## 2023-12-09 DIAGNOSIS — B029 Zoster without complications: Secondary | ICD-10-CM

## 2023-12-09 DIAGNOSIS — R634 Abnormal weight loss: Secondary | ICD-10-CM

## 2023-12-09 DIAGNOSIS — K219 Gastro-esophageal reflux disease without esophagitis: Secondary | ICD-10-CM

## 2023-12-09 DIAGNOSIS — F418 Other specified anxiety disorders: Secondary | ICD-10-CM | POA: Diagnosis not present

## 2023-12-09 DIAGNOSIS — R1013 Epigastric pain: Secondary | ICD-10-CM | POA: Diagnosis not present

## 2023-12-09 DIAGNOSIS — K589 Irritable bowel syndrome without diarrhea: Secondary | ICD-10-CM

## 2023-12-09 NOTE — Progress Notes (Signed)
 HISTORY OF PRESENT ILLNESS:  [name] is a 78 y.o. female with GI and non-GI functional complaints related to anxiety and depression.  She is well-known to me.  Last seen in the office August 12, 2023.  See that dictation.  At her request, we decreased her Zoloft  to 75 mg daily.  We prescribed metoprolol  40 mg daily.  Follow-up in 2 months was planned.  However, appointment was rescheduled and she presents for follow-up at this time.  She tells me that at the end of March she decreased her Zoloft  to 50 mg at night.  GI wise, states she is doing well.  Feels pantoprazole  helps.  Unfortunately, she did develop herpes zoster involving the left lower back and abdomen, about 3 weeks ago.  Treated with Valtrex .  Still with pain, but improving.  She is pushing herself to eat.  Energy level seem better.  No weight loss since last visit.  No new complaints.  She is interested in continuing to wean down her Zoloft , at some point.  REVIEW OF SYSTEMS:  All non-GI ROS negative. Past Medical History:  Diagnosis Date   Acute upper respiratory infections of unspecified site    Allergy    ANXIETY 10/12/2008   Arthritis    Cancer (HCC)    breast- right   COPD 03/26/2009   pt denies this   COVID    GERD (gastroesophageal reflux disease)    HERPES ZOSTER 10/12/2008   Hyperlipidemia    Hypertension    Lumbago    Palpitations    Syncopal episodes 02/2009   "presyncopal" prbly vasovagal-MI ruled out; EKG normal; telemetry with PVC's/PAC's. Stress echo-no evidence for ischemia. EF 65-70%. No valvular abnormalities, no pulm. HTN   Urinary tract infection, site not specified     Past Surgical History:  Procedure Laterality Date   BREAST SURGERY     2004- pt denies lymph node removal   COLONOSCOPY  04/20/2003   per Dr. Elvin Hammer, clear. Cologuard 01-24-19 negative (repeat in 3 yrs)    OOPHORECTOMY     ROTATOR CUFF REPAIR Left 2014    Social History [name]  reports that she has quit smoking.  She has never used smokeless tobacco. She reports current alcohol use of about 7.0 - 14.0 standard drinks of alcohol per week. She reports that she does not use drugs.  family history includes Alzheimer's disease in her mother; Dementia in her mother; Diabetes in her brother; Parkinsonism in her father.  Allergies  Allergen Reactions   Cefdinir    Codeine Phosphate    Ephedrine Hcl    Lipitor [Atorvastatin ]     Muscle cramps    Other     Phedral   Penicillins     REACTION: per allergy test       PHYSICAL EXAMINATION: Vital signs: BP 104/62   Pulse 79   Ht 5\' 6"  (1.676 m)   Wt 113 lb (51.3 kg)   BMI 18.24 kg/m   Constitutional: generally well-appearing, no acute distress Psychiatric: alert and oriented x3, cooperative Eyes: extraocular movements intact, anicteric, conjunctiva pink Mouth: oral pharynx moist, no lesions Neck: supple no lymphadenopathy Cardiovascular: heart regular rate and rhythm, no murmur Lungs: clear to auscultation bilaterally Abdomen: soft, nontender, nondistended, no obvious ascites, no peritoneal signs, normal bowel sounds, no organomegaly.  Zoster rash remnants left lower back, side, and abdomen Rectal: Omitted Extremities: no clubbing, cyanosis, or lower extremity edema bilaterally Skin: no lesions on visible extremities Neuro: No focal deficits.  Cranial nerves intact  ASSESSMENT:  1.  Functional dyspepsia 2.  Anxiety/depression 3.  GERD/functional chest discomfort 4.  Recent zoster infection 5.  Problems with weight loss improved on Zoloft    PLAN:  1.  Continue PPI 2.  Continue Zoloft  at 50 mg at night. 3.  Office reevaluation in 3 months

## 2023-12-09 NOTE — Patient Instructions (Signed)
 _______________________________________________________  If your blood pressure at your visit was 140/90 or greater, please contact your primary care physician to follow up on this.  _______________________________________________________  If you are age 78 or older, your body mass index should be between 23-30. Your Body mass index is 18.24 kg/m. If this is out of the aforementioned range listed, please consider follow up with your Primary Care Provider.  If you are age 86 or younger, your body mass index should be between 19-25. Your Body mass index is 18.24 kg/m. If this is out of the aformentioned range listed, please consider follow up with your Primary Care Provider.   ________________________________________________________  The [place] [facility] providers would like to encourage you to use MYCHART to communicate with providers for non-urgent requests or questions.  Due to long hold times on the telephone, sending your provider a message by [facility] may be a faster and more efficient way to get a response.  Please allow 48 business hours for a response.  Please remember that this is for non-urgent requests.  _______________________________________________________

## 2023-12-21 ENCOUNTER — Telehealth: Payer: Self-pay | Admitting: Family Medicine

## 2023-12-21 ENCOUNTER — Ambulatory Visit (INDEPENDENT_AMBULATORY_CARE_PROVIDER_SITE_OTHER)

## 2023-12-21 DIAGNOSIS — E538 Deficiency of other specified B group vitamins: Secondary | ICD-10-CM

## 2023-12-21 MED ORDER — CYANOCOBALAMIN 1000 MCG/ML IJ SOLN
1000.0000 ug | Freq: Once | INTRAMUSCULAR | Status: AC
Start: 2023-12-21 — End: 2023-12-21
  Administered 2023-12-21: 1000 ug via INTRAMUSCULAR

## 2023-12-21 MED ORDER — GABAPENTIN 100 MG PO CAPS
100.0000 mg | ORAL_CAPSULE | Freq: Three times a day (TID) | ORAL | 3 refills | Status: DC
Start: 2023-12-21 — End: 2024-06-07

## 2023-12-21 NOTE — Telephone Encounter (Signed)
 She will try Gabapentin for post herpetic neuropathy

## 2023-12-21 NOTE — Telephone Encounter (Signed)
 Pt spouse notified to pick up Rx from the pharmacy

## 2023-12-21 NOTE — Progress Notes (Signed)
 Patient is in office today for a nurse visit for B12 Injection. Patient Injection was given in the  Right deltoid. Patient tolerated injection well.

## 2024-01-04 ENCOUNTER — Ambulatory Visit: Payer: Self-pay

## 2024-01-04 ENCOUNTER — Ambulatory Visit (INDEPENDENT_AMBULATORY_CARE_PROVIDER_SITE_OTHER): Admitting: *Deleted

## 2024-01-04 DIAGNOSIS — E538 Deficiency of other specified B group vitamins: Secondary | ICD-10-CM

## 2024-01-04 MED ORDER — CYANOCOBALAMIN 1000 MCG/ML IJ SOLN: 1000.0000 ug | Freq: Once | INTRAMUSCULAR | Status: AC

## 2024-01-04 NOTE — Progress Notes (Signed)
Per orders of Dr. Fry, injection of B12 given by Deyonna Fitzsimmons. Patient tolerated injection well. 

## 2024-01-04 NOTE — Telephone Encounter (Signed)
 FYI Only or Action Required?: FYI only for provider  Patient was last seen in primary care on 12/08/2023 by Donley Furth, MD. Called Nurse Triage reporting Shortness of Breath. Symptoms began a week ago. Interventions attempted: Prescription medications: steroid, gabapentin , valtrex . Symptoms are: intermittent SOB, chest tightness/congestion with a dry cough feels like mucus that is stuck and needs to come up, headache, runny nose stable.  Triage Disposition: See PCP When Office is Open (Within 3 Days) (overriding See PCP Within 2 Weeks)  Patient/caregiver understands and will follow disposition?: Yes                           Reason for Disposition  [1] MILD longstanding difficulty breathing AND [2]  SAME as normal  Answer Assessment - Initial Assessment Questions 1. RESPIRATORY STATUS: Describe your breathing? (e.g., wheezing, shortness of breath, unable to speak, severe coughing)      Shortness of breath. She states she can take a deep breath,  hold it, and let it out. She states she is concerned because she has had pneumonia in the past.  2. ONSET: When did this breathing problem begin?      About a week into taking gabapentin , so for about 1 week total.  3. PATTERN Does the difficult breathing come and go, or has it been constant since it started?      Comes and goes.  4. SEVERITY: How bad is your breathing? (e.g., mild, moderate, severe)    - MILD: No SOB at rest, mild SOB with walking, speaks normally in sentences, can lie down, no retractions, pulse < 100.    - MODERATE: SOB at rest, SOB with minimal exertion and prefers to sit, cannot lie down flat, speaks in phrases, mild retractions, audible wheezing, pulse 100-120.    - SEVERE: Very SOB at rest, speaks in single words, struggling to breathe, sitting hunched forward, retractions, pulse > 120      She states right now she doesn't have any SOB and is speaking in full sentences.  5. RECURRENT  SYMPTOM: Have you had difficulty breathing before? If Yes, ask: When was the last time? and What happened that time?      Yes, she states with sinus infections and allergies.  6. CARDIAC HISTORY: Do you have any history of heart disease? (e.g., heart attack, angina, bypass surgery, angioplasty)      No.  7. LUNG HISTORY: Do you have any history of lung disease?  (e.g., pulmonary embolus, asthma, emphysema)     Mild COPD in chart but patient denies it.  8. CAUSE: What do you think is causing the breathing problem?      She thinks this is related to being on gabapentin . Recently diagnosis of shingles.  9. OTHER SYMPTOMS: Do you have any other symptoms? (e.g., dizziness, runny nose, cough, chest pain, fever)     Headache, chest tightness sometimes with an occasional cough (not productive), runny nose.  10. O2 SATURATION MONITOR:  Do you use an oxygen saturation monitor (pulse oximeter) at home? If Yes, ask: What is your reading (oxygen level) today? What is your usual oxygen saturation reading? (e.g., 95%)       No.  11. PREGNANCY: Is there any chance you are pregnant? When was your last menstrual period?       N/A.  12. TRAVEL: Have you traveled out of the country in the last month? (e.g., travel history, exposures)  No.  Patient states she is on gabapentin , (has taken prednisone and antiviral) for her shingles. She states she read online side effects of gabapentin  and she thinks that is causing the breathing issues. Patient denies wheezing, chest pain, fever, jaw/neck/left arm pain. She states she has stopped taking her flonase and her inhalers because she thought that was affecting the gabapentin .  Protocols used: Breathing Difficulty-A-AH

## 2024-01-06 ENCOUNTER — Encounter: Payer: Self-pay | Admitting: Family Medicine

## 2024-01-06 ENCOUNTER — Ambulatory Visit (INDEPENDENT_AMBULATORY_CARE_PROVIDER_SITE_OTHER): Admitting: Family Medicine

## 2024-01-06 VITALS — BP 126/60 | HR 62 | Temp 98.3°F | Wt 113.0 lb

## 2024-01-06 DIAGNOSIS — B029 Zoster without complications: Secondary | ICD-10-CM | POA: Diagnosis not present

## 2024-01-06 DIAGNOSIS — F411 Generalized anxiety disorder: Secondary | ICD-10-CM | POA: Diagnosis not present

## 2024-01-06 NOTE — Progress Notes (Signed)
   Subjective:    Patient ID: [name], female    DOB: [birth year], 78 y.o.   MRN: [id number]  HPI Here to follow up on shingles. The pain is vastly improved so she thinks she is alsmost over it. She had been taking Gabapentin  but in the last week she developed some nausea and dizzy spells, and she felt the Gabapentin  was causing this. She stopped taking this yesterday and she does in fact feel better today. She has decreased her daily dose of Sertraline  to 25 mg, and this is working well for her.    Review of Systems  Constitutional: Negative.   Respiratory: Negative.    Cardiovascular: Negative.   Psychiatric/Behavioral: Negative.         Objective:   Physical Exam Constitutional:      Appearance: Normal appearance.   Cardiovascular:     Rate and Rhythm: Normal rate and regular rhythm.     Pulses: Normal pulses.     Heart sounds: Normal heart sounds.  Pulmonary:     Effort: Pulmonary effort is normal.     Breath sounds: Normal breath sounds.   Neurological:     Mental Status: She is alert.   Psychiatric:        Mood and Affect: Mood normal.        Behavior: Behavior normal.        Thought Content: Thought content normal.           Assessment & Plan:  Her shingles have almost completely resolved. We agreed to stay off the Gabapentin . Her anxiety is well controlled on the low dose of Sertraline , so we will maintain this dose.  Corita Diego, MD

## 2024-01-18 ENCOUNTER — Ambulatory Visit (INDEPENDENT_AMBULATORY_CARE_PROVIDER_SITE_OTHER)

## 2024-01-18 DIAGNOSIS — B029 Zoster without complications: Secondary | ICD-10-CM | POA: Diagnosis not present

## 2024-01-18 DIAGNOSIS — J45991 Cough variant asthma: Secondary | ICD-10-CM | POA: Diagnosis not present

## 2024-01-18 DIAGNOSIS — H6691 Otitis media, unspecified, right ear: Secondary | ICD-10-CM | POA: Diagnosis not present

## 2024-01-18 DIAGNOSIS — E538 Deficiency of other specified B group vitamins: Secondary | ICD-10-CM | POA: Diagnosis not present

## 2024-01-18 DIAGNOSIS — J019 Acute sinusitis, unspecified: Secondary | ICD-10-CM | POA: Diagnosis not present

## 2024-01-18 MED ORDER — CYANOCOBALAMIN 1000 MCG/ML IJ SOLN
1000.0000 ug | Freq: Once | INTRAMUSCULAR | Status: AC
Start: 2024-01-18 — End: 2024-01-18
  Administered 2024-01-18: 1000 ug via INTRAMUSCULAR

## 2024-01-18 NOTE — Progress Notes (Signed)
 Patient is in office today for a nurse visit for B12 Injection. Patient Injection was given in the  Right deltoid. Patient tolerated injection well.

## 2024-01-28 DIAGNOSIS — J45991 Cough variant asthma: Secondary | ICD-10-CM | POA: Diagnosis not present

## 2024-01-28 DIAGNOSIS — J019 Acute sinusitis, unspecified: Secondary | ICD-10-CM | POA: Diagnosis not present

## 2024-01-28 DIAGNOSIS — B029 Zoster without complications: Secondary | ICD-10-CM | POA: Diagnosis not present

## 2024-01-28 DIAGNOSIS — J300 Vasomotor rhinitis: Secondary | ICD-10-CM | POA: Diagnosis not present

## 2024-02-01 ENCOUNTER — Ambulatory Visit

## 2024-02-01 DIAGNOSIS — E538 Deficiency of other specified B group vitamins: Secondary | ICD-10-CM

## 2024-02-01 MED ORDER — CYANOCOBALAMIN 1000 MCG/ML IJ SOLN
1000.0000 ug | Freq: Once | INTRAMUSCULAR | Status: AC
  Administered 2024-02-01: 1000 ug via INTRAMUSCULAR

## 2024-02-01 NOTE — Progress Notes (Signed)
 Pt here for monthly B12 injection per Dr Johnny  B12 1000mcg given IM and pt tolerated injection well.  Next B12 injection scheduled for 02/15/24

## 2024-02-15 ENCOUNTER — Ambulatory Visit (INDEPENDENT_AMBULATORY_CARE_PROVIDER_SITE_OTHER): Admitting: *Deleted

## 2024-02-15 DIAGNOSIS — E538 Deficiency of other specified B group vitamins: Secondary | ICD-10-CM | POA: Diagnosis not present

## 2024-02-15 MED ORDER — CYANOCOBALAMIN 1000 MCG/ML IJ SOLN
1000.0000 ug | Freq: Once | INTRAMUSCULAR | Status: AC
  Administered 2024-02-15: 1000 ug via INTRAMUSCULAR

## 2024-02-15 NOTE — Progress Notes (Signed)
Per orders of Dr. Fry, injection of B12 given by Deyonna Fitzsimmons. Patient tolerated injection well. 

## 2024-02-25 ENCOUNTER — Other Ambulatory Visit: Payer: Self-pay | Admitting: Family Medicine

## 2024-03-01 ENCOUNTER — Ambulatory Visit

## 2024-03-01 DIAGNOSIS — E538 Deficiency of other specified B group vitamins: Secondary | ICD-10-CM | POA: Diagnosis not present

## 2024-03-01 MED ORDER — CYANOCOBALAMIN 1000 MCG/ML IJ SOLN
1000.0000 ug | Freq: Once | INTRAMUSCULAR | Status: AC
  Administered 2024-03-01 (×2): 1000 ug via INTRAMUSCULAR

## 2024-03-01 NOTE — Progress Notes (Signed)
 Patient is in office today for a nurse visit for B12 Injection. Patient Injection was given in the  Left deltoid. Patient tolerated injection well.

## 2024-03-06 ENCOUNTER — Other Ambulatory Visit: Payer: Self-pay | Admitting: Family Medicine

## 2024-03-09 ENCOUNTER — Encounter: Payer: Self-pay | Admitting: Internal Medicine

## 2024-03-09 ENCOUNTER — Ambulatory Visit (INDEPENDENT_AMBULATORY_CARE_PROVIDER_SITE_OTHER): Admitting: Internal Medicine

## 2024-03-09 VITALS — BP 130/68 | HR 66 | Ht 66.0 in | Wt 114.0 lb

## 2024-03-09 DIAGNOSIS — F418 Other specified anxiety disorders: Secondary | ICD-10-CM | POA: Diagnosis not present

## 2024-03-09 DIAGNOSIS — K219 Gastro-esophageal reflux disease without esophagitis: Secondary | ICD-10-CM | POA: Diagnosis not present

## 2024-03-09 DIAGNOSIS — R634 Abnormal weight loss: Secondary | ICD-10-CM

## 2024-03-09 DIAGNOSIS — F419 Anxiety disorder, unspecified: Secondary | ICD-10-CM

## 2024-03-09 DIAGNOSIS — K589 Irritable bowel syndrome without diarrhea: Secondary | ICD-10-CM

## 2024-03-09 MED ORDER — SERTRALINE HCL 50 MG PO TABS: 100.0000 mg | ORAL_TABLET | Freq: Every day | ORAL | 6 refills | Status: AC

## 2024-03-09 NOTE — Patient Instructions (Addendum)
 We have sent the following medications to your pharmacy for you to pick up at your convenience:  Zoloft  - take 75mg  daily for one month then increase to 100mg .  _______________________________________________________  If your blood pressure at your visit was 140/90 or greater, please contact your primary care physician to follow up on this.  _______________________________________________________  If you are age 78 or older, your body mass index should be between 23-30. Your Body mass index is 18.4 kg/m. If this is out of the aforementioned range listed, please consider follow up with your Primary Care Provider.  If you are age 69 or younger, your body mass index should be between 19-25. Your Body mass index is 18.4 kg/m. If this is out of the aformentioned range listed, please consider follow up with your Primary Care Provider.   ________________________________________________________  The [place] [facility] providers would like to encourage you to use MYCHART to communicate with providers for non-urgent requests or questions.  Due to long hold times on the telephone, sending your provider a message by [facility] may be a faster and more efficient way to get a response.  Please allow 48 business hours for a response.  Please remember that this is for non-urgent requests.  _______________________________________________________  [facility] is using a team-based approach to care.  Your team is made up of your doctor and two to three APPS. Our APPS (Nurse Practitioners and Physician Assistants) work with your physician to ensure care continuity for you. They are fully qualified to address your health concerns and develop a treatment plan. They communicate directly with your gastroenterologist to care for you. Seeing the Advanced Practice Practitioners on your physician's team can help you by facilitating care more promptly, often allowing for earlier appointments, access to diagnostic testing,  procedures, and other specialty referrals.

## 2024-03-09 NOTE — Progress Notes (Signed)
 HISTORY OF PRESENT ILLNESS:  [name] is a 78 y.o. female who presents today for follow-up of functional dyspepsia associated with anxiety and depression, GERD with functional chest discomfort, and fluctuating weight.  She was last seen Dec 09, 2023.  At that time she was experiencing a recent zoster infection.  On her own she decreased her Zoloft  to 50 mg at night.  We agreed that she would stay on that dosage and follow-up at this time.  She tells me that she has been experiencing more anxiety and anhedonia.  She feels that she did better on higher dose Zoloft .  She is still recovering from the effects of shingles.  She also has recently contracted a head cold with some inner ear symptoms.  She has been able to maintain her weight.  Really, no new complaints.  REVIEW OF SYSTEMS:  All non-GI ROS negative   Past Medical History:  Diagnosis Date   Acute upper respiratory infections of unspecified site    Allergy    ANXIETY 10/12/2008   Arthritis    Cancer (HCC)    breast- right   COPD 03/26/2009   pt denies this   COVID    GERD (gastroesophageal reflux disease)    HERPES ZOSTER 10/12/2008   Hyperlipidemia    Hypertension    Lumbago    Palpitations    Syncopal episodes 02/2009   presyncopal prbly vasovagal-MI ruled out; EKG normal; telemetry with PVC's/PAC's. Stress echo-no evidence for ischemia. EF 65-70%. No valvular abnormalities, no pulm. HTN   Urinary tract infection, site not specified     Past Surgical History:  Procedure Laterality Date   BREAST SURGERY     2004- pt denies lymph node removal   COLONOSCOPY  04/20/2003   per Dr. Abran, clear. Cologuard 01-24-19 negative (repeat in 3 yrs)    OOPHORECTOMY     ROTATOR CUFF REPAIR Left 2014    Social History ZOLA RUNION  reports that she has quit smoking. She has never used smokeless tobacco. She reports current alcohol use of about 7.0 - 14.0 standard drinks of alcohol per week. She reports that she does not  use drugs.  family history includes Alzheimer's disease in her mother; Dementia in her mother; Diabetes in her brother; Parkinsonism in her father.  Allergies  Allergen Reactions   Cefdinir    Codeine Phosphate    Ephedrine Hcl    Lipitor [Atorvastatin ]     Muscle cramps    Other     Phedral   Penicillins     REACTION: per allergy test       PHYSICAL EXAMINATION: Vital signs: BP 130/68   Pulse 66   Ht 5' 6 (1.676 m)   Wt 114 lb (51.7 kg)   BMI 18.40 kg/m   Constitutional: generally well-appearing, no acute distress Psychiatric: alert and oriented x3, cooperative Eyes: extraocular movements intact, anicteric, conjunctiva pink Mouth: oral pharynx moist, no lesions Neck: supple no lymphadenopathy Cardiovascular: heart regular rate and rhythm, no murmur Lungs: clear to auscultation bilaterally Abdomen: soft, nontender, nondistended, no obvious ascites, no peritoneal signs, normal bowel sounds, no organomegaly Rectal: Omitted Extremities: no clubbing, cyanosis, or lower extremity edema bilaterally Skin: no lesions on visible extremities Neuro: No focal deficits.  Cranial nerves intact  ASSESSMENT:   1.  Functional dyspepsia.  Ongoing 2.  Anxiety/depression.  Seemingly worse with dose reduction of Zoloft  3.  GERD/functional chest discomfort.  She feels that she is helped by pantoprazole . 4.  Recent zoster  infection.  Recovering 5.  Problems with weight loss have improved on Zoloft .  Weight stable from last visit     PLAN:   1.  Increase Zoloft  to 75 mg at night for 1 month.  If tolerating, then increase Zoloft  to 100 mg at night until follow-up.  Medication refilled.  Medication risks reviewed. 2.  Continue pantoprazole . 3.  Reassurance 4.  Office reevaluation in 3 months.  Contact the office in the interim for questions or problems Total time of 30 minutes was spent preparing to see the patient, obtaining interval history, performing medically appropriate physical  examination, counseling and educating the patient regarding the above listed issues, ordering medication, arranging follow-up, and documenting clinical information in the health record.

## 2024-03-14 ENCOUNTER — Ambulatory Visit (INDEPENDENT_AMBULATORY_CARE_PROVIDER_SITE_OTHER)

## 2024-03-14 DIAGNOSIS — E538 Deficiency of other specified B group vitamins: Secondary | ICD-10-CM

## 2024-03-14 MED ORDER — CYANOCOBALAMIN 1000 MCG/ML IJ SOLN
1000.0000 ug | Freq: Once | INTRAMUSCULAR | Status: AC
  Administered 2024-03-14: 1000 ug via INTRAMUSCULAR

## 2024-03-14 NOTE — Progress Notes (Signed)
 Patient is in office today for a nurse visit for B12 Injection. Patient Injection was given in the  Right deltoid. Patient tolerated injection well.

## 2024-03-17 DIAGNOSIS — Z1231 Encounter for screening mammogram for malignant neoplasm of breast: Secondary | ICD-10-CM | POA: Diagnosis not present

## 2024-03-17 LAB — HM MAMMOGRAPHY

## 2024-03-22 DIAGNOSIS — H2513 Age-related nuclear cataract, bilateral: Secondary | ICD-10-CM | POA: Diagnosis not present

## 2024-03-22 DIAGNOSIS — H524 Presbyopia: Secondary | ICD-10-CM | POA: Diagnosis not present

## 2024-03-22 DIAGNOSIS — D3132 Benign neoplasm of left choroid: Secondary | ICD-10-CM | POA: Diagnosis not present

## 2024-03-28 ENCOUNTER — Ambulatory Visit (INDEPENDENT_AMBULATORY_CARE_PROVIDER_SITE_OTHER)

## 2024-03-28 ENCOUNTER — Telehealth: Payer: Self-pay

## 2024-03-28 DIAGNOSIS — E538 Deficiency of other specified B group vitamins: Secondary | ICD-10-CM

## 2024-03-28 MED ORDER — ALPRAZOLAM 0.5 MG PO TABS: 0.5000 mg | ORAL_TABLET | Freq: Two times a day (BID) | ORAL | 5 refills | Status: AC | PRN

## 2024-03-28 MED ORDER — CYANOCOBALAMIN 1000 MCG/ML IJ SOLN
1000.0000 ug | Freq: Once | INTRAMUSCULAR | Status: AC
  Administered 2024-03-28: 1000 ug via INTRAMUSCULAR

## 2024-03-28 NOTE — Progress Notes (Signed)
 Pt here for monthly B12 injection per Dr Johnny  B12 1000mcg given IM and pt tolerated injection well.  Next B12 injection scheduled for 04/11/24

## 2024-03-28 NOTE — Telephone Encounter (Signed)
 Left detailed message for pt advised that Rx for Xanax  was sent to her pharmacy

## 2024-03-28 NOTE — Telephone Encounter (Signed)
 Done

## 2024-04-11 ENCOUNTER — Ambulatory Visit (INDEPENDENT_AMBULATORY_CARE_PROVIDER_SITE_OTHER)

## 2024-04-11 DIAGNOSIS — E538 Deficiency of other specified B group vitamins: Secondary | ICD-10-CM | POA: Diagnosis not present

## 2024-04-11 MED ORDER — CYANOCOBALAMIN 1000 MCG/ML IJ SOLN
1000.0000 ug | Freq: Once | INTRAMUSCULAR | Status: AC
  Administered 2024-04-11: 1000 ug via INTRAMUSCULAR

## 2024-04-11 NOTE — Progress Notes (Signed)
 Patient is in office today for a nurse visit for B12 Injection. Patient Injection was given in the  Right deltoid. Patient tolerated injection well.

## 2024-04-25 ENCOUNTER — Ambulatory Visit

## 2024-04-25 DIAGNOSIS — E538 Deficiency of other specified B group vitamins: Secondary | ICD-10-CM

## 2024-04-25 MED ORDER — CYANOCOBALAMIN 1000 MCG/ML IJ SOLN
1000.0000 ug | Freq: Once | INTRAMUSCULAR | Status: AC
  Administered 2024-04-25: 1000 ug via INTRAMUSCULAR

## 2024-04-25 NOTE — Progress Notes (Signed)
 Patient is in office today for a nurse visit for B12 Injection. Patient Injection was given in the  Left deltoid. Patient tolerated injection well.

## 2024-05-09 ENCOUNTER — Ambulatory Visit (INDEPENDENT_AMBULATORY_CARE_PROVIDER_SITE_OTHER)

## 2024-05-09 DIAGNOSIS — E538 Deficiency of other specified B group vitamins: Secondary | ICD-10-CM

## 2024-05-09 MED ORDER — CYANOCOBALAMIN 1000 MCG/ML IJ SOLN
1000.0000 ug | Freq: Once | INTRAMUSCULAR | Status: AC
  Administered 2024-05-09: 1000 ug via INTRAMUSCULAR

## 2024-05-09 NOTE — Progress Notes (Signed)
 Pt here for monthly B12 injection per Dr Johnny  B12 1000mcg given IM and pt tolerated injection well.  Next B12 injection scheduled for 05/16/24

## 2024-05-23 ENCOUNTER — Ambulatory Visit (INDEPENDENT_AMBULATORY_CARE_PROVIDER_SITE_OTHER)

## 2024-05-23 DIAGNOSIS — Z23 Encounter for immunization: Secondary | ICD-10-CM

## 2024-05-23 DIAGNOSIS — E538 Deficiency of other specified B group vitamins: Secondary | ICD-10-CM | POA: Diagnosis not present

## 2024-05-23 MED ORDER — CYANOCOBALAMIN 1000 MCG/ML IJ SOLN
1000.0000 ug | Freq: Once | INTRAMUSCULAR | Status: AC
  Administered 2024-05-23: 1000 ug via INTRAMUSCULAR

## 2024-05-23 NOTE — Progress Notes (Signed)
 Patient is in office today for a nurse visit for B12 Injection. Patient Injection was given in the  Left deltoid. Patient tolerated injection well.  Patient is in office today for a nurse visit for the Influenza High Dose Immunization. Patient Injection was given in the  Right deltoid. Patient tolerated injection well.

## 2024-06-03 ENCOUNTER — Other Ambulatory Visit: Payer: Self-pay | Admitting: Family Medicine

## 2024-06-06 ENCOUNTER — Ambulatory Visit

## 2024-06-06 DIAGNOSIS — E538 Deficiency of other specified B group vitamins: Secondary | ICD-10-CM | POA: Diagnosis not present

## 2024-06-06 MED ORDER — CYANOCOBALAMIN 1000 MCG/ML IJ SOLN
1000.0000 ug | Freq: Once | INTRAMUSCULAR | Status: AC
  Administered 2024-06-06: 1000 ug via INTRAMUSCULAR

## 2024-06-06 NOTE — Progress Notes (Signed)
 Patient is in office today for a nurse visit for B12 Injection. Patient Injection was given in the  Right deltoid. Patient tolerated injection well.

## 2024-06-07 ENCOUNTER — Encounter: Payer: Self-pay | Admitting: Internal Medicine

## 2024-06-07 ENCOUNTER — Ambulatory Visit (INDEPENDENT_AMBULATORY_CARE_PROVIDER_SITE_OTHER): Admitting: Internal Medicine

## 2024-06-07 VITALS — BP 120/70 | HR 78 | Ht 66.0 in | Wt 116.0 lb

## 2024-06-07 DIAGNOSIS — K589 Irritable bowel syndrome without diarrhea: Secondary | ICD-10-CM

## 2024-06-07 DIAGNOSIS — R634 Abnormal weight loss: Secondary | ICD-10-CM

## 2024-06-07 DIAGNOSIS — K219 Gastro-esophageal reflux disease without esophagitis: Secondary | ICD-10-CM | POA: Diagnosis not present

## 2024-06-07 DIAGNOSIS — F419 Anxiety disorder, unspecified: Secondary | ICD-10-CM

## 2024-06-07 DIAGNOSIS — R194 Change in bowel habit: Secondary | ICD-10-CM

## 2024-06-07 NOTE — Patient Instructions (Signed)
 Please follow up in three months.  _______________________________________________________  If your blood pressure at your visit was 140/90 or greater, please contact your primary care physician to follow up on this.  _______________________________________________________  If you are age 78 or older, your body mass index should be between 23-30. Your Body mass index is 18.72 kg/m. If this is out of the aforementioned range listed, please consider follow up with your Primary Care Provider.  If you are age 37 or younger, your body mass index should be between 19-25. Your Body mass index is 18.72 kg/m. If this is out of the aformentioned range listed, please consider follow up with your Primary Care Provider.   ________________________________________________________  The [place] [facility] providers would like to encourage you to use MYCHART to communicate with providers for non-urgent requests or questions.  Due to long hold times on the telephone, sending your provider a message by [facility] may be a faster and more efficient way to get a response.  Please allow 48 business hours for a response.  Please remember that this is for non-urgent requests.  _______________________________________________________  [facility] is using a team-based approach to care.  Your team is made up of your doctor and two to three APPS. Our APPS (Nurse Practitioners and Physician Assistants) work with your physician to ensure care continuity for you. They are fully qualified to address your health concerns and develop a treatment plan. They communicate directly with your gastroenterologist to care for you. Seeing the Advanced Practice Practitioners on your physician's team can help you by facilitating care more promptly, often allowing for earlier appointments, access to diagnostic testing, procedures, and other specialty referrals.

## 2024-06-07 NOTE — Progress Notes (Signed)
 HISTORY OF PRESENT ILLNESS:  [name] is a 78 y.o. female who presents today for follow-up of functional dyspepsia associated with anxiety and depression, GERD with functional chest discomfort, and fluctuating weight.  She was last seen March 09, 2024.  At that time she was recovering from zoster, and dealing with head cold and inner ear symptoms.  As well, she self reduced her Zoloft  with more anxiety and anhedonia.  We increased her Zoloft  from 50 mg daily to 75 mg daily at night for 1 month then increase to 100 mg daily at night.  She presents today for follow-up.  Has been compliant with the current dose of Zoloft .  She does note occasional loose stools, but not diarrhea.  She still has low energy at times and feels sad at times.  She worries about her aging husband and stay-at-home adult son.  However, her appetite has improved.  She is gaining weight.  As well, she has increased activities and is going out much more socially.  Nothing new or worrisome to report.   REVIEW OF SYSTEMS:  All non-GI ROS negative except for anxiety, sinus and allergy trouble  Past Medical History:  Diagnosis Date   Acute upper respiratory infections of unspecified site    Allergy    ANXIETY 10/12/2008   Arthritis    Cancer (HCC)    breast- right   COPD 03/26/2009   pt denies this   COVID    GERD (gastroesophageal reflux disease)    HERPES ZOSTER 10/12/2008   Hyperlipidemia    Hypertension    Lumbago    Palpitations    Syncopal episodes 02/2009   presyncopal prbly vasovagal-MI ruled out; EKG normal; telemetry with PVC's/PAC's. Stress echo-no evidence for ischemia. EF 65-70%. No valvular abnormalities, no pulm. HTN   Urinary tract infection, site not specified     Past Surgical History:  Procedure Laterality Date   BREAST SURGERY     2004- pt denies lymph node removal   COLONOSCOPY  04/20/2003   per Dr. Abran, clear. Cologuard 01-24-19 negative (repeat in 3 yrs)    OOPHORECTOMY      ROTATOR CUFF REPAIR Left 2014    Social History [name]  reports that she has quit smoking. She has never used smokeless tobacco. She reports current alcohol use of about 7.0 - 14.0 standard drinks of alcohol per week. She reports that she does not use drugs.  family history includes Alzheimer's disease in her mother; Dementia in her mother; Diabetes in her brother; Parkinsonism in her father.  Allergies  Allergen Reactions   Cefdinir    Codeine Phosphate    Ephedrine Hcl    Lipitor [Atorvastatin ]     Muscle cramps    Other     Phedral   Penicillins     REACTION: per allergy test       PHYSICAL EXAMINATION: Vital signs: BP 120/70   Pulse 78   Ht 5' 6 (1.676 m)   Wt 116 lb (52.6 kg)   BMI 18.72 kg/m   Constitutional: generally well-appearing, no acute distress Psychiatric: alert and oriented x3, cooperative Eyes: extraocular movements intact, anicteric, conjunctiva pink Mouth: oral pharynx moist, no lesions Neck: supple no lymphadenopathy Cardiovascular: heart regular rate and rhythm, no murmur Lungs: clear to auscultation bilaterally Abdomen: soft, nontender, nondistended, no obvious ascites, no peritoneal signs, normal bowel sounds, no organomegaly Rectal: Extremities: no lower extremity edema bilaterally Skin: no lesions on visible extremities Neuro: No focal deficits. No asterixis.  ASSESSMENT:   1.  Functional dyspepsia.  Ongoing 2.  Anxiety/depression.  Improved with higher dose Zoloft . 3.  GERD/functional chest discomfort.  She feels that she is helped by pantoprazole . 4.  Recent zoster infection.  Recovered 5.  Problems with weight loss have improved on Zoloft .  Weight stable up 2 pounds from last visit     PLAN:   1.  Continue Zoloft  100 mg at night. 2.  Continue pantoprazole . 3.  Reassurance 4.  Office reevaluation in 3 months.  Contact the office in the interim for questions or problems Total time of 30 minutes was spent preparing to  see the patient, obtaining interval history, performing medically appropriate physical examination, counseling and educating the patient regarding the above listed issues, ordering medication, arranging follow-up, and documenting clinical information in the health record.

## 2024-06-20 ENCOUNTER — Ambulatory Visit

## 2024-06-20 DIAGNOSIS — E538 Deficiency of other specified B group vitamins: Secondary | ICD-10-CM

## 2024-06-20 MED ORDER — CYANOCOBALAMIN 1000 MCG/ML IJ SOLN
1000.0000 ug | Freq: Once | INTRAMUSCULAR | Status: AC
  Administered 2024-06-20: 1000 ug via INTRAMUSCULAR

## 2024-06-20 NOTE — Progress Notes (Signed)
 Patient is in office today for a nurse visit for B12 Injection. Patient Injection was given in the  Left deltoid. Patient tolerated injection well.

## 2024-07-04 ENCOUNTER — Ambulatory Visit

## 2024-07-04 DIAGNOSIS — E538 Deficiency of other specified B group vitamins: Secondary | ICD-10-CM

## 2024-07-04 MED ORDER — CYANOCOBALAMIN 1000 MCG/ML IJ SOLN
1000.0000 ug | Freq: Once | INTRAMUSCULAR | Status: AC
  Administered 2024-07-04: 10:00:00 1000 ug via INTRAMUSCULAR

## 2024-07-04 NOTE — Progress Notes (Signed)
 Patient is in office today for a nurse visit for B12 Injection. Patient Injection was given in the  Right deltoid. Patient tolerated injection well.

## 2024-07-18 ENCOUNTER — Ambulatory Visit

## 2024-07-19 ENCOUNTER — Ambulatory Visit (INDEPENDENT_AMBULATORY_CARE_PROVIDER_SITE_OTHER)

## 2024-07-19 DIAGNOSIS — E538 Deficiency of other specified B group vitamins: Secondary | ICD-10-CM

## 2024-07-19 MED ORDER — CYANOCOBALAMIN 1000 MCG/ML IJ SOLN
1000.0000 ug | Freq: Once | INTRAMUSCULAR | Status: AC
  Administered 2024-07-19: 1000 ug via INTRAMUSCULAR

## 2024-07-19 NOTE — Progress Notes (Signed)
 Patient is in office today for a nurse visit for B12 Injection. Patient Injection was given in the  Left deltoid. Patient tolerated injection well.

## 2024-08-01 ENCOUNTER — Ambulatory Visit

## 2024-08-01 DIAGNOSIS — E538 Deficiency of other specified B group vitamins: Secondary | ICD-10-CM

## 2024-08-01 MED ORDER — CYANOCOBALAMIN 1000 MCG/ML IJ SOLN
1000.0000 ug | Freq: Once | INTRAMUSCULAR | Status: AC
  Administered 2024-08-01: 1000 ug via INTRAMUSCULAR

## 2024-08-01 NOTE — Progress Notes (Signed)
 Patient is in office today for a nurse visit for B12 Injection. Patient Injection was given in the  Right deltoid. Patient tolerated injection well.

## 2024-08-04 ENCOUNTER — Other Ambulatory Visit: Payer: Self-pay | Admitting: Internal Medicine

## 2024-08-12 ENCOUNTER — Ambulatory Visit

## 2024-08-12 DIAGNOSIS — E538 Deficiency of other specified B group vitamins: Secondary | ICD-10-CM

## 2024-08-12 MED ORDER — CYANOCOBALAMIN 1000 MCG/ML IJ SOLN
1000.0000 ug | Freq: Once | INTRAMUSCULAR | Status: AC
  Administered 2024-08-12: 1000 ug via INTRAMUSCULAR

## 2024-08-12 NOTE — Progress Notes (Signed)
 Patient is in office today for a nurse visit for B12 Injection. Patient Injection was given in the  Left deltoid. Patient tolerated injection well.

## 2024-08-15 ENCOUNTER — Encounter: Admitting: Family Medicine

## 2024-08-29 ENCOUNTER — Encounter: Admitting: Family Medicine

## 2024-10-05 ENCOUNTER — Ambulatory Visit: Admitting: Internal Medicine
